# Patient Record
Sex: Female | Born: 1980
Health system: Southern US, Community
[De-identification: ages and names within clinical notes are randomized; demographics above are authoritative.]

## PROBLEM LIST (undated history)

## (undated) DIAGNOSIS — E785 Hyperlipidemia, unspecified: Secondary | ICD-10-CM

## (undated) DIAGNOSIS — G35 Multiple sclerosis: Secondary | ICD-10-CM

## (undated) DIAGNOSIS — Z95820 Peripheral vascular angioplasty status with implants and grafts: Secondary | ICD-10-CM

## (undated) DIAGNOSIS — I10 Essential (primary) hypertension: Secondary | ICD-10-CM

## (undated) DIAGNOSIS — I214 Non-ST elevation (NSTEMI) myocardial infarction: Secondary | ICD-10-CM

## (undated) HISTORY — PX: CHOLECYSTECTOMY: SHX55

## (undated) HISTORY — PX: TUBAL LIGATION: SHX77

## (undated) HISTORY — DX: Peripheral vascular angioplasty status with implants and grafts: Z95.820

---

## 2019-07-02 DIAGNOSIS — I472 Ventricular tachycardia: Secondary | ICD-10-CM

## 2019-07-02 DIAGNOSIS — I4729 Other ventricular tachycardia: Secondary | ICD-10-CM

## 2019-07-02 HISTORY — DX: Ventricular tachycardia: I47.2

## 2019-07-02 HISTORY — DX: Other ventricular tachycardia: I47.29

## 2019-07-03 ENCOUNTER — Inpatient Hospital Stay (HOSPITAL_BASED_OUTPATIENT_CLINIC_OR_DEPARTMENT_OTHER)
Admission: EM | Admit: 2019-07-03 | Discharge: 2019-07-05 | DRG: 247 | Disposition: A | Discharge status: Home / Self Care | Payer: Medicaid Other | Attending: Cardiology | Admitting: Cardiology

## 2019-07-03 ENCOUNTER — Emergency Department (HOSPITAL_BASED_OUTPATIENT_CLINIC_OR_DEPARTMENT_OTHER): Payer: Medicaid Other

## 2019-07-03 ENCOUNTER — Encounter (HOSPITAL_COMMUNITY)
Admission: EM | Disposition: A | Discharge status: Home / Self Care | Payer: Self-pay | Source: Home / Self Care | Attending: Cardiology

## 2019-07-03 ENCOUNTER — Encounter (HOSPITAL_BASED_OUTPATIENT_CLINIC_OR_DEPARTMENT_OTHER): Payer: Self-pay | Admitting: Emergency Medicine

## 2019-07-03 ENCOUNTER — Other Ambulatory Visit: Payer: Self-pay

## 2019-07-03 ENCOUNTER — Inpatient Hospital Stay (HOSPITAL_COMMUNITY): Payer: Medicaid Other

## 2019-07-03 DIAGNOSIS — I10 Essential (primary) hypertension: Secondary | ICD-10-CM | POA: Diagnosis present

## 2019-07-03 DIAGNOSIS — Z882 Allergy status to sulfonamides status: Secondary | ICD-10-CM

## 2019-07-03 DIAGNOSIS — Z720 Tobacco use: Secondary | ICD-10-CM

## 2019-07-03 DIAGNOSIS — Z87442 Personal history of urinary calculi: Secondary | ICD-10-CM | POA: Diagnosis not present

## 2019-07-03 DIAGNOSIS — Z91018 Allergy to other foods: Secondary | ICD-10-CM | POA: Diagnosis not present

## 2019-07-03 DIAGNOSIS — E785 Hyperlipidemia, unspecified: Secondary | ICD-10-CM | POA: Diagnosis present

## 2019-07-03 DIAGNOSIS — Z955 Presence of coronary angioplasty implant and graft: Secondary | ICD-10-CM

## 2019-07-03 DIAGNOSIS — I472 Ventricular tachycardia, unspecified: Secondary | ICD-10-CM

## 2019-07-03 DIAGNOSIS — H547 Unspecified visual loss: Secondary | ICD-10-CM | POA: Diagnosis present

## 2019-07-03 DIAGNOSIS — G35 Multiple sclerosis: Secondary | ICD-10-CM | POA: Diagnosis present

## 2019-07-03 DIAGNOSIS — I214 Non-ST elevation (NSTEMI) myocardial infarction: Principal | ICD-10-CM | POA: Diagnosis present

## 2019-07-03 DIAGNOSIS — I200 Unstable angina: Secondary | ICD-10-CM | POA: Diagnosis not present

## 2019-07-03 DIAGNOSIS — I249 Acute ischemic heart disease, unspecified: Secondary | ICD-10-CM

## 2019-07-03 DIAGNOSIS — F1290 Cannabis use, unspecified, uncomplicated: Secondary | ICD-10-CM | POA: Diagnosis present

## 2019-07-03 DIAGNOSIS — Z20822 Contact with and (suspected) exposure to covid-19: Secondary | ICD-10-CM | POA: Diagnosis present

## 2019-07-03 DIAGNOSIS — F17210 Nicotine dependence, cigarettes, uncomplicated: Secondary | ICD-10-CM | POA: Diagnosis present

## 2019-07-03 DIAGNOSIS — Z9049 Acquired absence of other specified parts of digestive tract: Secondary | ICD-10-CM | POA: Diagnosis not present

## 2019-07-03 DIAGNOSIS — Z8249 Family history of ischemic heart disease and other diseases of the circulatory system: Secondary | ICD-10-CM

## 2019-07-03 DIAGNOSIS — I2510 Atherosclerotic heart disease of native coronary artery without angina pectoris: Secondary | ICD-10-CM

## 2019-07-03 DIAGNOSIS — R0789 Other chest pain: Secondary | ICD-10-CM | POA: Diagnosis present

## 2019-07-03 DIAGNOSIS — I25110 Atherosclerotic heart disease of native coronary artery with unstable angina pectoris: Secondary | ICD-10-CM | POA: Diagnosis present

## 2019-07-03 HISTORY — DX: Hyperlipidemia, unspecified: E78.5

## 2019-07-03 HISTORY — PX: CORONARY BALLOON ANGIOPLASTY: CATH118233

## 2019-07-03 HISTORY — PX: CORONARY STENT INTERVENTION: CATH118234

## 2019-07-03 HISTORY — DX: Essential (primary) hypertension: I10

## 2019-07-03 HISTORY — PX: CORONARY ULTRASOUND/IVUS: CATH118244

## 2019-07-03 HISTORY — PX: INTRAVASCULAR ULTRASOUND/IVUS: CATH118244

## 2019-07-03 HISTORY — PX: LEFT HEART CATH AND CORONARY ANGIOGRAPHY: CATH118249

## 2019-07-03 HISTORY — DX: Non-ST elevation (NSTEMI) myocardial infarction: I21.4

## 2019-07-03 HISTORY — DX: Multiple sclerosis: G35

## 2019-07-03 LAB — BASIC METABOLIC PANEL
Anion gap: 10 (ref 5–15)
BUN: 14 mg/dL (ref 6–20)
CO2: 22 mmol/L (ref 22–32)
Calcium: 9.4 mg/dL (ref 8.9–10.3)
Chloride: 107 mmol/L (ref 98–111)
Creatinine, Ser: 0.64 mg/dL (ref 0.44–1.00)
GFR calc Af Amer: >60 mL/min (ref >60)
GFR calc non Af Amer: >60 mL/min (ref >60)
Glucose, Bld: 149 mg/dL — ABNORMAL HIGH (ref 70–99)
Potassium: 4.2 mmol/L (ref 3.5–5.1)
Sodium: 139 mmol/L (ref 135–145)

## 2019-07-03 LAB — CBC
HCT: 44.3 % (ref 36.0–46.0)
HCT: 38.6 % (ref 36.0–46.0)
Hemoglobin: 15 g/dL (ref 12.0–15.0)
Hemoglobin: 13 g/dL (ref 12.0–15.0)
MCH: 33 pg (ref 26.0–34.0)
MCH: 32.9 pg (ref 26.0–34.0)
MCHC: 33.9 g/dL (ref 30.0–36.0)
MCHC: 33.7 g/dL (ref 30.0–36.0)
MCV: 97.4 fL (ref 80.0–100.0)
MCV: 97.7 fL (ref 80.0–100.0)
Platelets: 235 10*3/uL (ref 150–400)
Platelets: 210 10*3/uL (ref 150–400)
RBC: 4.55 MIL/uL (ref 3.87–5.11)
RBC: 3.95 MIL/uL (ref 3.87–5.11)
RDW: 13.5 % (ref 11.5–15.5)
RDW: 13.5 % (ref 11.5–15.5)
WBC: 17.1 10*3/uL — ABNORMAL HIGH (ref 4.0–10.5)
WBC: 15.1 10*3/uL — ABNORMAL HIGH (ref 4.0–10.5)
nRBC: 0 % (ref 0.0–0.2)
nRBC: 0 % (ref 0.0–0.2)

## 2019-07-03 LAB — RAPID URINE DRUG SCREEN, HOSP PERFORMED
Amphetamines: NOT DETECTED
Barbiturates: NOT DETECTED
Benzodiazepines: NOT DETECTED
Cocaine: NOT DETECTED
Opiates: NOT DETECTED
Tetrahydrocannabinol: POSITIVE — AB

## 2019-07-03 LAB — MAGNESIUM
Magnesium: 1.8 mg/dL (ref 1.7–2.4)
Magnesium: 1.9 mg/dL (ref 1.7–2.4)

## 2019-07-03 LAB — MRSA PCR SCREENING: MRSA by PCR: NEGATIVE

## 2019-07-03 LAB — TROPONIN I (HIGH SENSITIVITY)
Troponin I (High Sensitivity): 39 ng/L — ABNORMAL HIGH (ref <18)
Troponin I (High Sensitivity): 6820 ng/L (ref <18)
Troponin I (High Sensitivity): 6633 ng/L (ref <18)

## 2019-07-03 LAB — ECHOCARDIOGRAM COMPLETE
Height: 62.5 in
Weight: 2624 oz

## 2019-07-03 LAB — POCT ACTIVATED CLOTTING TIME
Activated Clotting Time: 389 seconds
Activated Clotting Time: 423 seconds

## 2019-07-03 LAB — SARS CORONAVIRUS 2 BY RT PCR (HOSPITAL ORDER, PERFORMED IN ~~LOC~~ HOSPITAL LAB): SARS Coronavirus 2: NEGATIVE

## 2019-07-03 LAB — HIV ANTIBODY (ROUTINE TESTING W REFLEX): HIV Screen 4th Generation wRfx: NONREACTIVE

## 2019-07-03 LAB — CREATININE, SERUM
Creatinine, Ser: 0.63 mg/dL (ref 0.44–1.00)
GFR calc Af Amer: >60 mL/min (ref >60)
GFR calc non Af Amer: >60 mL/min (ref >60)

## 2019-07-03 LAB — TSH: TSH: 1.607 u[IU]/mL (ref 0.350–4.500)

## 2019-07-03 IMAGING — CR DG CHEST 2V
2 series · 2 of 2 positions shown · non-contrast
Comparison: None.

CLINICAL DATA: Chest pain and shortness of breath

EXAM:
CHEST - 2 VIEW

[w chest pa]
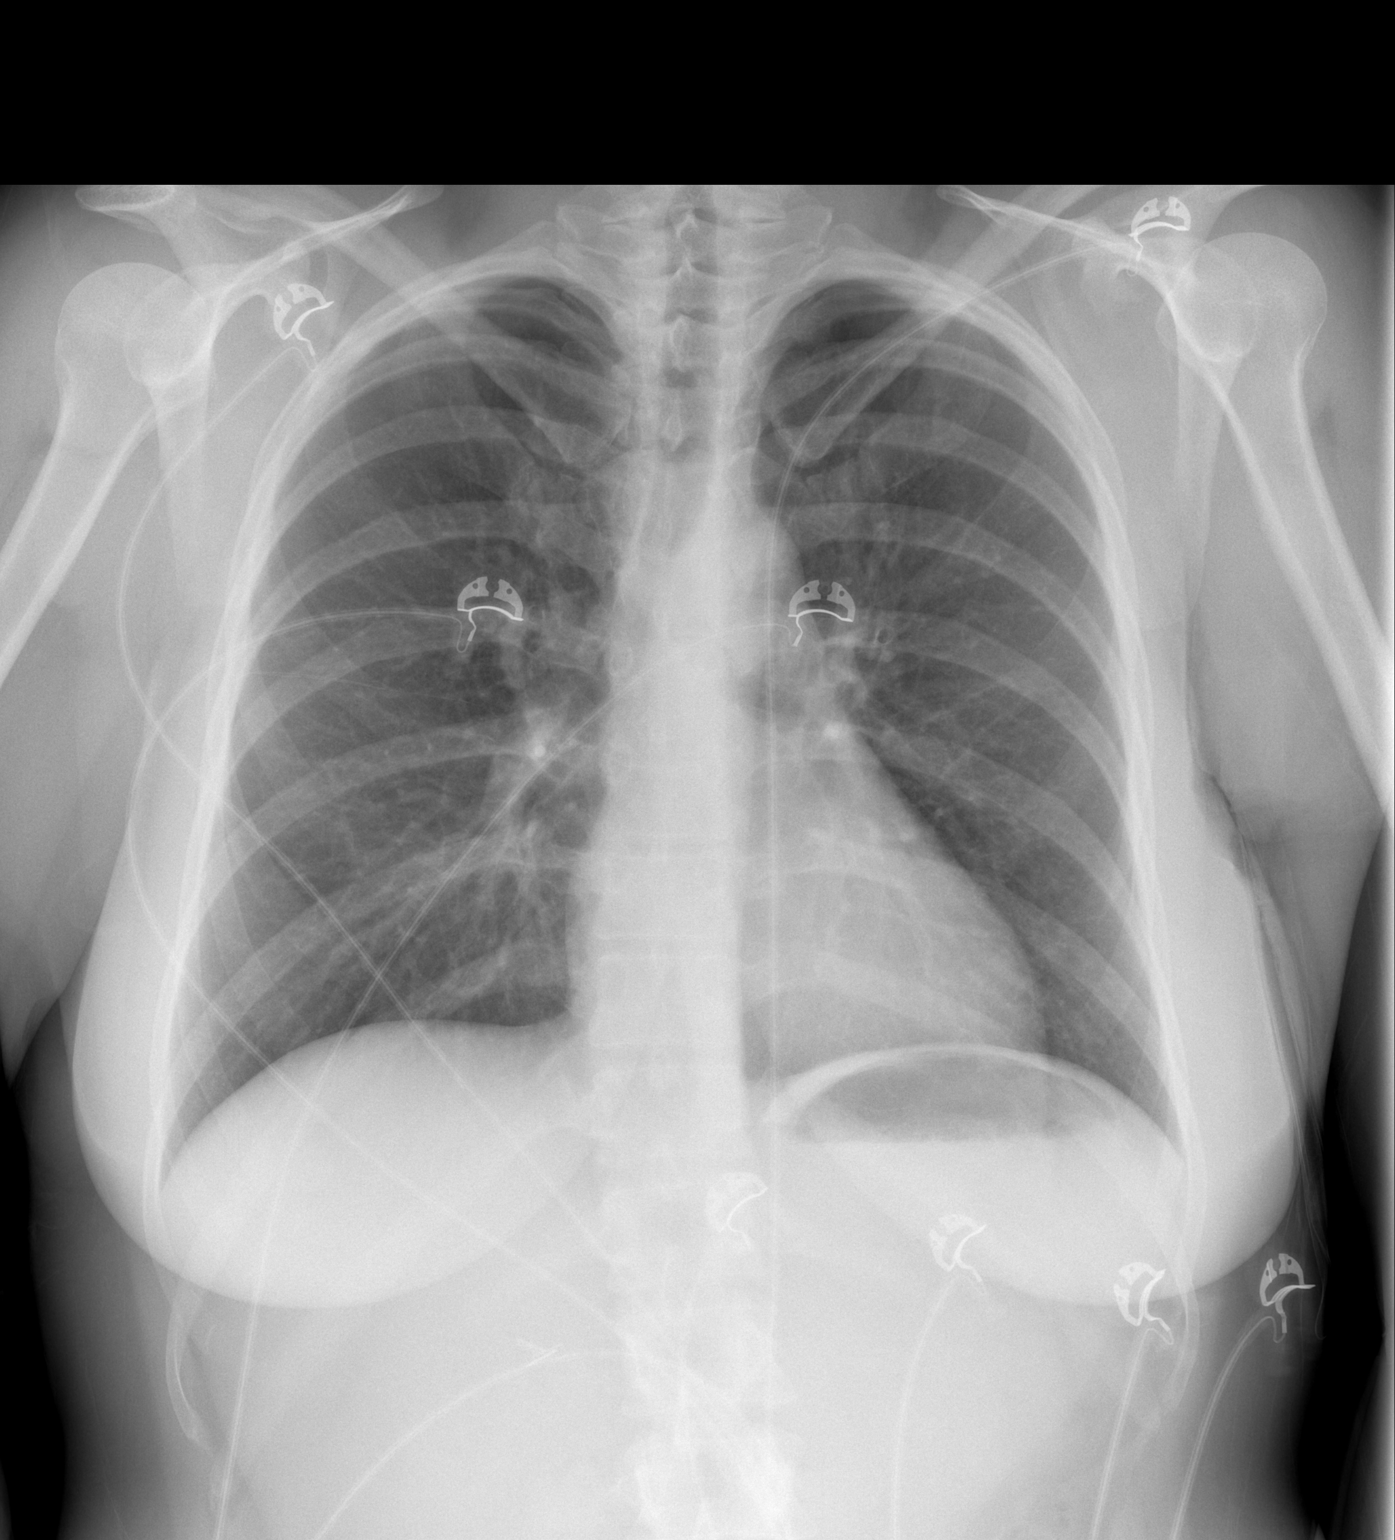

[w chest lat]
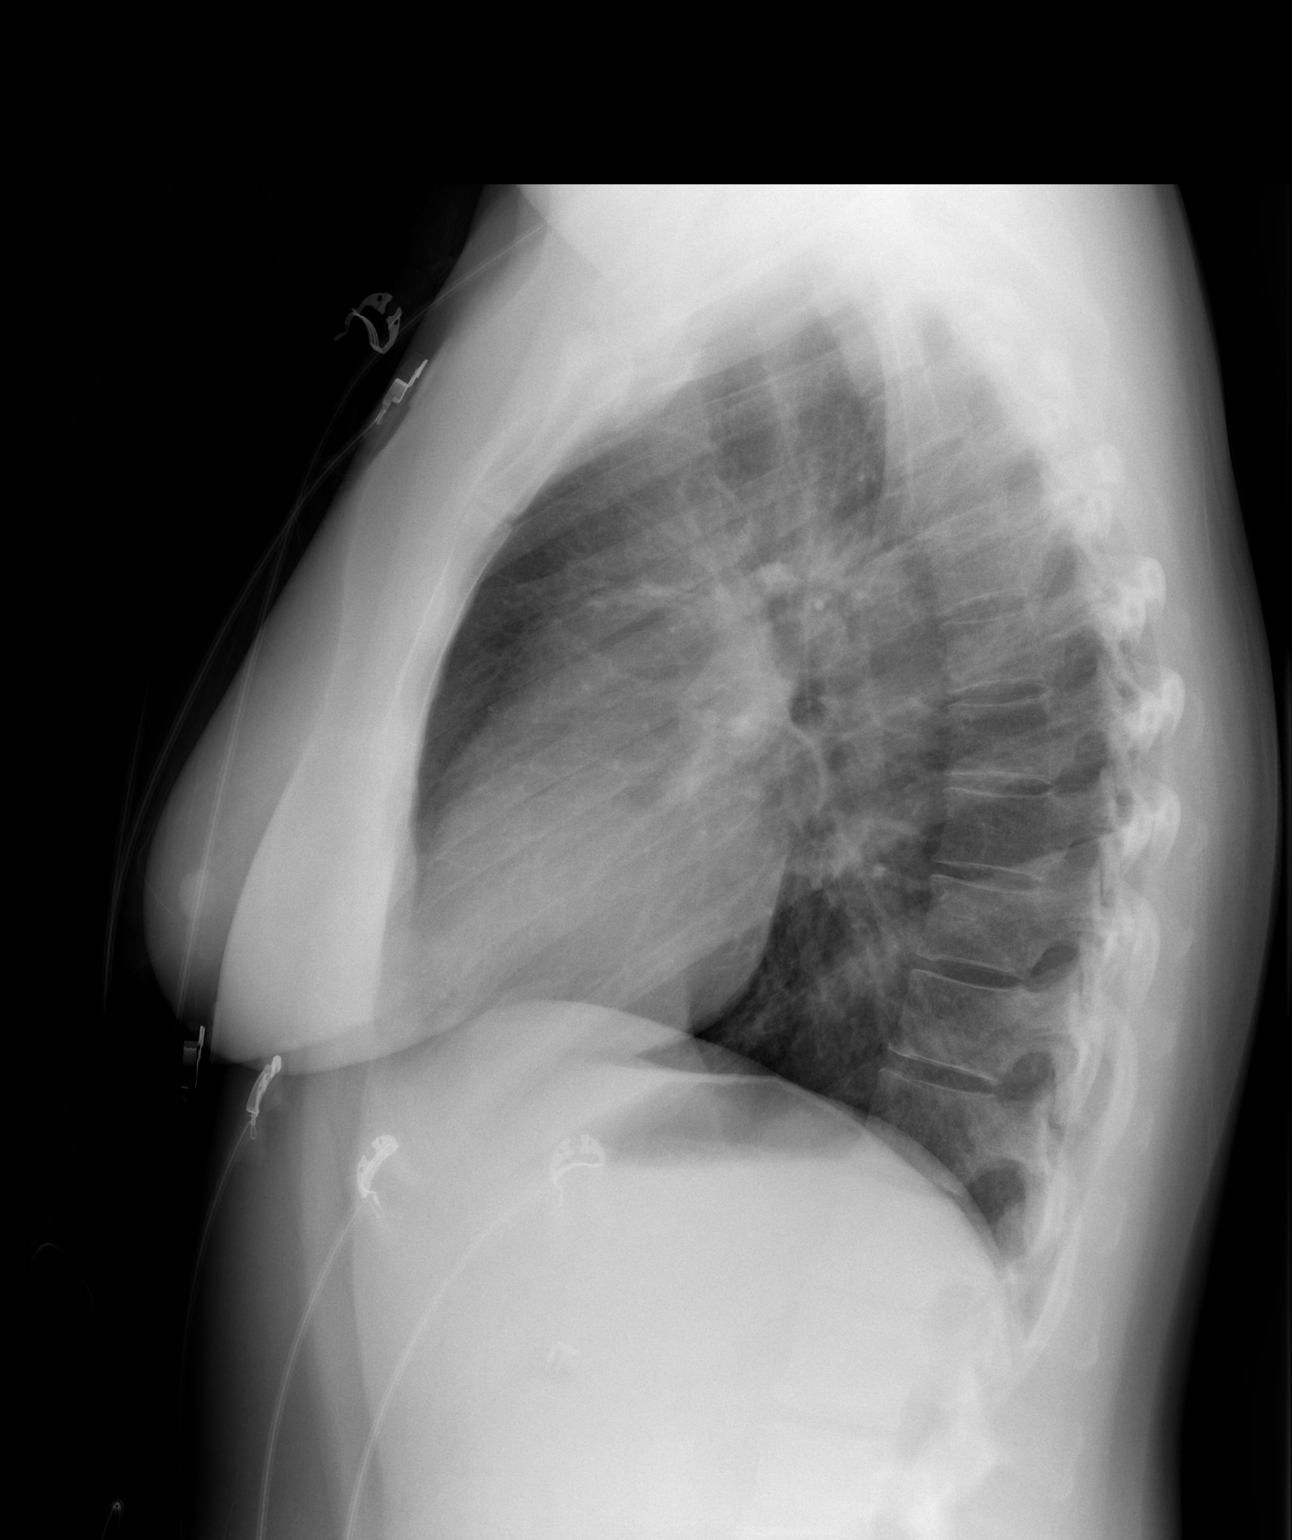

[2 of 2 positions shown; findings below may reference images not displayed]

FINDINGS: Lungs are clear. The heart size and pulmonary vascularity are
normal. No adenopathy. No pneumothorax. No bone lesions.
IMPRESSION: No abnormality noted.

## 2019-07-03 SURGERY — LEFT HEART CATH AND CORONARY ANGIOGRAPHY
Anesthesia: LOCAL

## 2019-07-03 MED ORDER — TICAGRELOR 90 MG PO TABS
ORAL_TABLET | ORAL | Status: AC
  Filled 2019-07-03: qty 1

## 2019-07-03 MED ORDER — BIVALIRUDIN TRIFLUOROACETATE 250 MG IV SOLR
INTRAVENOUS | Status: AC
  Filled 2019-07-03: qty 250

## 2019-07-03 MED ORDER — ASPIRIN 81 MG PO CHEW
324.0000 mg | CHEWABLE_TABLET | Freq: Once | ORAL | Status: AC
  Administered 2019-07-03: 324 mg via ORAL

## 2019-07-03 MED ORDER — KETOROLAC TROMETHAMINE 30 MG/ML IJ SOLN
30.0000 mg | Freq: Once | INTRAMUSCULAR | Status: AC
  Administered 2019-07-03: 30 mg via INTRAVENOUS
  Filled 2019-07-03: qty 1

## 2019-07-03 MED ORDER — ONDANSETRON HCL 4 MG/2ML IJ SOLN
INTRAMUSCULAR | Status: AC
  Filled 2019-07-03: qty 2

## 2019-07-03 MED ORDER — SODIUM CHLORIDE 0.9 % WEIGHT BASED INFUSION: 1.0000 mL/kg/h | INTRAVENOUS | Status: DC

## 2019-07-03 MED ORDER — BIVALIRUDIN BOLUS VIA INFUSION - CUPID
INTRAVENOUS | Status: DC | PRN
  Administered 2019-07-03: 55.5 mg via INTRAVENOUS

## 2019-07-03 MED ORDER — SODIUM CHLORIDE 0.9 % WEIGHT BASED INFUSION: 3.0000 mL/kg/h | INTRAVENOUS | Status: DC

## 2019-07-03 MED ORDER — FENTANYL CITRATE (PF) 100 MCG/2ML IJ SOLN
INTRAMUSCULAR | Status: AC
  Filled 2019-07-03: qty 2

## 2019-07-03 MED ORDER — AMIODARONE IV BOLUS ONLY 150 MG/100ML
150.0000 mg | Freq: Once | INTRAVENOUS | Status: AC
  Administered 2019-07-03: 150 mg via INTRAVENOUS
  Filled 2019-07-03: qty 100

## 2019-07-03 MED ORDER — SODIUM CHLORIDE 0.9 % IV SOLN: 250.0000 mL | INTRAVENOUS | Status: DC | PRN

## 2019-07-03 MED ORDER — FENTANYL CITRATE (PF) 100 MCG/2ML IJ SOLN
INTRAMUSCULAR | Status: DC | PRN
  Administered 2019-07-03 (×4): 25 ug via INTRAVENOUS

## 2019-07-03 MED ORDER — SODIUM CHLORIDE 0.9 % IV BOLUS
1000.0000 mL | Freq: Once | INTRAVENOUS | Status: AC
  Administered 2019-07-03: 1000 mL via INTRAVENOUS

## 2019-07-03 MED ORDER — NITROGLYCERIN 1 MG/10 ML FOR IR/CATH LAB
INTRA_ARTERIAL | Status: AC
  Filled 2019-07-03: qty 10

## 2019-07-03 MED ORDER — VERAPAMIL HCL 2.5 MG/ML IV SOLN
INTRAVENOUS | Status: DC | PRN
  Administered 2019-07-03: 10 mL via INTRA_ARTERIAL

## 2019-07-03 MED ORDER — TIROFIBAN HCL IN NACL 5-0.9 MG/100ML-% IV SOLN
0.1500 ug/kg/min | INTRAVENOUS | Status: AC
  Administered 2019-07-03 – 2019-07-04 (×2): 0.15 ug/kg/min via INTRAVENOUS
  Filled 2019-07-03 (×3): qty 100

## 2019-07-03 MED ORDER — ASPIRIN 81 MG PO CHEW
CHEWABLE_TABLET | ORAL | Status: AC
  Filled 2019-07-03: qty 4

## 2019-07-03 MED ORDER — MIDAZOLAM HCL 2 MG/2ML IJ SOLN
INTRAMUSCULAR | Status: AC
  Filled 2019-07-03: qty 2

## 2019-07-03 MED ORDER — SODIUM CHLORIDE 0.9 % IV SOLN
INTRAVENOUS | Status: AC | PRN
  Administered 2019-07-03: 1.75 mg/kg/h
  Administered 2019-07-03 (×2): 1.75 mg/kg/h via INTRAVENOUS

## 2019-07-03 MED ORDER — MAGNESIUM SULFATE 50 % IJ SOLN
1.0000 g | Freq: Once | INTRAMUSCULAR | Status: AC
  Administered 2019-07-03: 1 g via INTRAVENOUS
  Filled 2019-07-03: qty 2

## 2019-07-03 MED ORDER — SODIUM CHLORIDE 0.9% FLUSH: 3.0000 mL | INTRAVENOUS | Status: DC | PRN

## 2019-07-03 MED ORDER — NITROGLYCERIN IN D5W 200-5 MCG/ML-% IV SOLN: 0.0000 ug/min | INTRAVENOUS | Status: DC

## 2019-07-03 MED ORDER — ONDANSETRON HCL 4 MG/2ML IJ SOLN
4.0000 mg | Freq: Four times a day (QID) | INTRAMUSCULAR | Status: DC | PRN
  Administered 2019-07-03 – 2019-07-04 (×2): 4 mg via INTRAVENOUS
  Filled 2019-07-03 (×2): qty 2

## 2019-07-03 MED ORDER — LIDOCAINE HCL (PF) 1 % IJ SOLN
INTRAMUSCULAR | Status: DC | PRN
  Administered 2019-07-03: 2 mL

## 2019-07-03 MED ORDER — TICAGRELOR 90 MG PO TABS
ORAL_TABLET | ORAL | Status: DC | PRN
  Administered 2019-07-03 (×2): 180 mg via ORAL

## 2019-07-03 MED ORDER — HYDRALAZINE HCL 20 MG/ML IJ SOLN: 10.0000 mg | INTRAMUSCULAR | Status: AC | PRN

## 2019-07-03 MED ORDER — SODIUM CHLORIDE 0.9 % IV SOLN
1.7500 mg/kg/h | INTRAVENOUS | Status: AC
  Filled 2019-07-03 (×2): qty 250

## 2019-07-03 MED ORDER — TIROFIBAN HCL IN NACL 5-0.9 MG/100ML-% IV SOLN
INTRAVENOUS | Status: AC | PRN
  Administered 2019-07-03: 0.15 ug/kg/min via INTRAVENOUS

## 2019-07-03 MED ORDER — MIDAZOLAM HCL 2 MG/2ML IJ SOLN
INTRAMUSCULAR | Status: DC | PRN
  Administered 2019-07-03 (×4): 1 mg via INTRAVENOUS

## 2019-07-03 MED ORDER — NITROGLYCERIN 1 MG/10 ML FOR IR/CATH LAB
INTRA_ARTERIAL | Status: DC | PRN
  Administered 2019-07-03 (×3): 200 ug via INTRACORONARY

## 2019-07-03 MED ORDER — TICAGRELOR 90 MG PO TABS
90.0000 mg | ORAL_TABLET | Freq: Two times a day (BID) | ORAL | Status: DC
  Administered 2019-07-04 – 2019-07-05 (×3): 90 mg via ORAL
  Filled 2019-07-03 (×3): qty 1

## 2019-07-03 MED ORDER — VERAPAMIL HCL 2.5 MG/ML IV SOLN
INTRAVENOUS | Status: AC
  Filled 2019-07-03: qty 2

## 2019-07-03 MED ORDER — SIMVASTATIN 20 MG PO TABS
40.0000 mg | ORAL_TABLET | Freq: Every day | ORAL | Status: DC
  Administered 2019-07-03: 40 mg via ORAL
  Filled 2019-07-03: qty 2

## 2019-07-03 MED ORDER — LIDOCAINE HCL (PF) 1 % IJ SOLN
INTRAMUSCULAR | Status: AC
  Filled 2019-07-03: qty 30

## 2019-07-03 MED ORDER — TIROFIBAN (AGGRASTAT) BOLUS VIA INFUSION
INTRAVENOUS | Status: DC | PRN
  Administered 2019-07-03: 1850 ug via INTRAVENOUS

## 2019-07-03 MED ORDER — MORPHINE SULFATE (PF) 2 MG/ML IV SOLN
2.0000 mg | INTRAVENOUS | Status: AC | PRN
  Administered 2019-07-03 – 2019-07-04 (×2): 2 mg via INTRAVENOUS
  Filled 2019-07-03 (×2): qty 1

## 2019-07-03 MED ORDER — TICAGRELOR 90 MG PO TABS
ORAL_TABLET | ORAL | Status: AC
  Filled 2019-07-03: qty 2

## 2019-07-03 MED ORDER — METOPROLOL TARTRATE 12.5 MG HALF TABLET
12.5000 mg | ORAL_TABLET | Freq: Two times a day (BID) | ORAL | Status: DC
  Administered 2019-07-03 – 2019-07-05 (×5): 12.5 mg via ORAL
  Filled 2019-07-03 (×5): qty 1

## 2019-07-03 MED ORDER — HEPARIN SODIUM (PORCINE) 5000 UNIT/ML IJ SOLN: 5000.0000 [IU] | Freq: Three times a day (TID) | INTRAMUSCULAR | Status: DC

## 2019-07-03 MED ORDER — AMIODARONE HCL IN DEXTROSE 360-4.14 MG/200ML-% IV SOLN
30.0000 mg/h | INTRAVENOUS | Status: DC
  Administered 2019-07-03: 30 mg/h via INTRAVENOUS
  Filled 2019-07-03: qty 200

## 2019-07-03 MED ORDER — SODIUM CHLORIDE 0.9% FLUSH
3.0000 mL | Freq: Two times a day (BID) | INTRAVENOUS | Status: DC
  Administered 2019-07-03: 10 mL via INTRAVENOUS

## 2019-07-03 MED ORDER — AMIODARONE HCL IN DEXTROSE 360-4.14 MG/200ML-% IV SOLN
60.0000 mg/h | INTRAVENOUS | Status: AC
  Administered 2019-07-03 (×2): 60 mg/h via INTRAVENOUS
  Filled 2019-07-03: qty 200

## 2019-07-03 MED ORDER — NITROGLYCERIN 0.4 MG SL SUBL: 0.4000 mg | SUBLINGUAL_TABLET | SUBLINGUAL | Status: DC | PRN

## 2019-07-03 MED ORDER — AMIODARONE HCL IN DEXTROSE 360-4.14 MG/200ML-% IV SOLN
INTRAVENOUS | Status: AC
  Filled 2019-07-03: qty 200

## 2019-07-03 MED ORDER — TIROFIBAN HCL IN NACL 5-0.9 MG/100ML-% IV SOLN
INTRAVENOUS | Status: AC
  Filled 2019-07-03: qty 100

## 2019-07-03 MED ORDER — NITROGLYCERIN IN D5W 200-5 MCG/ML-% IV SOLN
INTRAVENOUS | Status: AC | PRN
  Administered 2019-07-03: 10 ug/min via INTRAVENOUS

## 2019-07-03 MED ORDER — LABETALOL HCL 5 MG/ML IV SOLN: 10.0000 mg | INTRAVENOUS | Status: AC | PRN

## 2019-07-03 MED ORDER — CHLORHEXIDINE GLUCONATE CLOTH 2 % EX PADS
6.0000 | MEDICATED_PAD | Freq: Every day | CUTANEOUS | Status: DC
  Administered 2019-07-03 – 2019-07-04 (×2): 6 via TOPICAL

## 2019-07-03 MED ORDER — ONDANSETRON HCL 4 MG/2ML IJ SOLN
INTRAMUSCULAR | Status: DC | PRN
  Administered 2019-07-03: 4 mg via INTRAVENOUS

## 2019-07-03 MED ORDER — MORPHINE SULFATE (PF) 4 MG/ML IV SOLN
INTRAVENOUS | Status: DC | PRN
  Administered 2019-07-03: 2 mg via INTRAVENOUS

## 2019-07-03 MED ORDER — IOHEXOL 350 MG/ML SOLN
INTRAVENOUS | Status: DC | PRN
  Administered 2019-07-03: 290 mL via INTRA_ARTERIAL

## 2019-07-03 MED ORDER — SODIUM CHLORIDE 0.9% FLUSH
3.0000 mL | Freq: Two times a day (BID) | INTRAVENOUS | Status: DC
  Administered 2019-07-03 – 2019-07-05 (×4): 3 mL via INTRAVENOUS

## 2019-07-03 MED ORDER — MORPHINE SULFATE (PF) 4 MG/ML IV SOLN
INTRAVENOUS | Status: AC
  Filled 2019-07-03: qty 1

## 2019-07-03 MED ORDER — ATORVASTATIN CALCIUM 80 MG PO TABS
80.0000 mg | ORAL_TABLET | Freq: Every day | ORAL | Status: DC
  Administered 2019-07-03 – 2019-07-05 (×3): 80 mg via ORAL
  Filled 2019-07-03 (×3): qty 1

## 2019-07-03 MED ORDER — SODIUM CHLORIDE 0.9 % IV SOLN: INTRAVENOUS | Status: AC

## 2019-07-03 MED ORDER — ACETAMINOPHEN 325 MG PO TABS
650.0000 mg | ORAL_TABLET | ORAL | Status: DC | PRN
  Administered 2019-07-04 (×2): 650 mg via ORAL
  Filled 2019-07-03 (×2): qty 2

## 2019-07-03 MED ORDER — ASPIRIN EC 81 MG PO TBEC
81.0000 mg | DELAYED_RELEASE_TABLET | Freq: Every day | ORAL | Status: DC
  Administered 2019-07-04 – 2019-07-05 (×2): 81 mg via ORAL
  Filled 2019-07-03 (×2): qty 1

## 2019-07-03 SURGICAL SUPPLY — 32 items
BALLN SAPPHIRE 2.0X12 (BALLOONS) ×2
BALLN SAPPHIRE 2.5X12 (BALLOONS) ×2
BALLN SAPPHIRE ~~LOC~~ 3.0X15 (BALLOONS) ×4 IMPLANT
BALLN SAPPHIRE ~~LOC~~ 3.25X10 (BALLOONS) ×2 IMPLANT
BALLN SAPPHIRE ~~LOC~~ 3.5X12 (BALLOONS) ×2 IMPLANT
BALLN SAPPHIRE ~~LOC~~ 3.5X8 (BALLOONS) ×2 IMPLANT
BALLN ~~LOC~~ EMERGE MR 4.0X6 (BALLOONS) ×2
BALLOON SAPPHIRE 2.0X12 (BALLOONS) ×1 IMPLANT
BALLOON SAPPHIRE 2.5X12 (BALLOONS) ×1 IMPLANT
BALLOON ~~LOC~~ EMERGE MR 4.0X6 (BALLOONS) ×1 IMPLANT
CATH 5FR JL3.5 JR4 ANG PIG MP (CATHETERS) ×2 IMPLANT
CATH OPTICROSS HD (CATHETERS) ×2 IMPLANT
CATH VISTA GUIDE 6FR XBLAD3.5 (CATHETERS) ×2 IMPLANT
DEVICE RAD COMP TR BAND LRG (VASCULAR PRODUCTS) ×4 IMPLANT
ELECT DEFIB PAD ADLT CADENCE (PAD) ×2 IMPLANT
GLIDESHEATH SLEND SS 6F .021 (SHEATH) ×2 IMPLANT
GUIDEWIRE INQWIRE 1.5J.035X260 (WIRE) ×1 IMPLANT
INQWIRE 1.5J .035X260CM (WIRE) ×2
KIT ENCORE 26 ADVANTAGE (KITS) ×4 IMPLANT
KIT HEART LEFT (KITS) ×2 IMPLANT
PACK CARDIAC CATHETERIZATION (CUSTOM PROCEDURE TRAY) ×2 IMPLANT
SHEATH GLIDE SLENDER 4/5FR (SHEATH) ×2 IMPLANT
SHEATH PROBE COVER 6X72 (BAG) ×2 IMPLANT
SLED PULL BACK IVUS (MISCELLANEOUS) ×2 IMPLANT
STENT RESOLUTE ONYX 3.0X12 (Permanent Stent) ×2 IMPLANT
STENT RESOLUTE ONYX 3.0X22 (Permanent Stent) ×2 IMPLANT
TRANSDUCER W/STOPCOCK (MISCELLANEOUS) ×2 IMPLANT
TUBING CIL FLEX 10 FLL-RA (TUBING) ×2 IMPLANT
WIRE COUGAR XT STRL 190CM (WIRE) ×2 IMPLANT
WIRE HI TORQ VERSACORE-J 145CM (WIRE) ×2 IMPLANT
WIRE HI TORQ WHISPER MS 190CM (WIRE) ×2 IMPLANT
WIRE RUNTHROUGH .014X180CM (WIRE) ×2 IMPLANT

## 2019-07-03 NOTE — ED Notes (Signed)
Cards at bedside

## 2019-07-03 NOTE — ED Provider Notes (Addendum)
[facility] EMERGENCY DEPARTMENT Provider Note   CSN: [id number] Arrival date & time: 07/03/19  5462     History Chief Complaint  Patient presents with  . Chest Pain    Grace Castillo is a 39 y.o. female.  Patient is a 39 year old female with history of prior cholecystectomy, hypertension, MS.  She presents today for evaluation of chest pain.  She describes a pressure to the center of her chest that radiates through to the back.  This began this morning.  She feels short of breath, but denies nausea.  She denies recent exertional symptoms.  She does report helping her sister pack for an upcoming move.  Patient was moving furniture and carrying heavy objects.  She has no prior cardiac history, but does have the risk factors of hypertension, tobacco use, and family history.  The history is provided by the patient.  Chest Pain Pain location:  Substernal area Pain quality: pressure   Pain radiates to:  Does not radiate Pain severity:  Moderate Onset quality:  Sudden Timing:  Constant Progression:  Unchanged Chronicity:  New Relieved by:  Nothing Worsened by:  Nothing      Past Medical History:  Diagnosis Date  . Hypertension   . MS (multiple sclerosis) ([place])     There are no problems to display for this patient.   Past Surgical History:  Procedure Laterality Date  . CHOLECYSTECTOMY    . TUBAL LIGATION       OB History   No obstetric history on file.     No family history on file.  Social History   Tobacco Use  . Smoking status: Current Every Day Smoker    Packs/day: 1.00    Types: Cigarettes  . Smokeless tobacco: Never Used  Substance Use Topics  . Alcohol use: Never  . Drug use: Yes    Types: Marijuana    Home Medications Prior to Admission medications   Not on File    Allergies    Pork-derived products and Sulfa antibiotics  Review of Systems   Review of Systems  Cardiovascular: Positive for chest pain.  All other systems  reviewed and are negative.   Physical Exam Updated Vital Signs BP (!) 141/94   Pulse 65   Temp (!) 97.5 F (36.4 C) (Oral)   Resp 18   Ht 5' 2.5" (1.588 m)   Wt 74.4 kg   LMP 06/13/2019   SpO2 100%   BMI 29.52 kg/m   Physical Exam Vitals and nursing note reviewed.  Constitutional:      General: She is not in acute distress.    Appearance: She is well-developed. She is not diaphoretic.  HENT:     Head: Normocephalic and atraumatic.  Cardiovascular:     Rate and Rhythm: Normal rate and regular rhythm.     Heart sounds: No murmur. No friction rub. No gallop.   Pulmonary:     Effort: Pulmonary effort is normal. No respiratory distress.     Breath sounds: Normal breath sounds. No wheezing.  Abdominal:     General: Bowel sounds are normal. There is no distension.     Palpations: Abdomen is soft.     Tenderness: There is no abdominal tenderness.  Musculoskeletal:        General: Normal range of motion.     Cervical back: Normal range of motion and neck supple.     Right lower leg: No tenderness. No edema.     Left lower leg: No  tenderness. No edema.  Skin:    General: Skin is warm and dry.  Neurological:     Mental Status: She is alert and oriented to person, place, and time.     ED Results / Procedures / Treatments   Labs (all labs ordered are listed, but only abnormal results are displayed) Labs Reviewed  CBC - Abnormal; Notable for the following components:      Result Value   WBC 17.1 (*)    All other components within normal limits  BASIC METABOLIC PANEL  TROPONIN I (HIGH SENSITIVITY)    EKG EKG Interpretation  Date/Time:  Monday Jul 03 2019 08:29:15 EDT Ventricular Rate:  66 PR Interval:    QRS Duration: 82 QT Interval:  427 QTC Calculation: 448 R Axis:   72 Text Interpretation: Sinus rhythm Probable left atrial enlargement Probable anteroseptal infarct, old Repol abnrm Confirmed by Geoffery Lyons ([id number]) on 07/03/2019 8:35:46 AM   Radiology DG  Chest 2 View  Result Date: 07/03/2019 CLINICAL DATA:  Chest pain and shortness of breath EXAM: CHEST - 2 VIEW COMPARISON:  None. FINDINGS: Lungs are clear. The heart size and pulmonary vascularity are normal. No adenopathy. No pneumothorax. No bone lesions. IMPRESSION: No abnormality noted. Electronically Signed   By: Bretta Bang III M.D.   On: 07/03/2019 09:09    Procedures Procedures (including critical care time)  Medications Ordered in ED Medications  sodium chloride 0.9 % bolus 1,000 mL (has no administration in time range)  ketorolac (TORADOL) 30 MG/ML injection 30 mg (has no administration in time range)    ED Course  I have reviewed the triage vital signs and the nursing notes.  Pertinent labs & imaging results that were available during my care of the patient were reviewed by me and considered in my medical decision making (see chart for details).    MDM Rules/Calculators/A&P  Patient is a 39 year old female with no prior cardiac history presenting with complaints of chest pain.  This began this morning shortly after waking up.  She reports helping her sister move over the past few days and has been exerting herself and lifting heavy objects more than normal.  Her initial EKG shows ST depressions throughout concerning for ischemia.  Cardiac work-up was initiated including troponin and laboratory studies.  While waiting for these studies to return, the patient experienced an episode of palpitations, and was found to have a short run of ventricular tachycardia.        This resolved spontaneously.  Shortly after this, her troponin returned at 25.  I discussed the case with Dr. Anne Fu from cardiology.  We are in agreement that she should be started on amiodarone and transported to the emergency department at [facility] for cardiology evaluation.  Patient will likely require a cardiac cath as she appears to be experiencing an acute coronary syndrome.  I have spoken with  Dr. Lockie Mola at [facility] who agrees to accept in transfer.  CRITICAL CARE Performed by: Geoffery Lyons Total critical care time: 45 minutes Critical care time was exclusive of separately billable procedures and treating other patients. Critical care was necessary to treat or prevent imminent or life-threatening deterioration. Critical care was time spent personally by me on the following activities: development of treatment plan with patient and/or surrogate as well as nursing, discussions with consultants, evaluation of patient's response to treatment, examination of patient, obtaining history from patient or surrogate, ordering and performing treatments and interventions, ordering and review of laboratory studies, ordering and  review of radiographic studies, pulse oximetry and re-evaluation of patient's condition.   Final Clinical Impression(s) / ED Diagnoses Final diagnoses:  None    Rx / DC Orders ED Discharge Orders    None       Geoffery Lyons, MD 07/03/19 1024    Geoffery Lyons, MD 07/03/19 1210

## 2019-07-03 NOTE — H&P (Addendum)
Cardiology Admission History and Physical:   Patient ID: Grace Castillo MRN: [id number]; DOB: November 09, 1980   Admission date: 07/03/2019  Primary Care Provider: Patient, No Pcp Per Primary Cardiologist: Donato Schultz, MD new Primary Electrophysiologist:  None   Chief Complaint:  VT, unstable angina  Patient Profile:   Grace Castillo is a 39 y.o. female with a history of untreated MS, untreated hypertension, and current tobacco use.   History of Present Illness:   Ms. [name] presented to [facility] with pressure and sharp chest pain in her substernal chest and shortness of breath. When she got out of the shower, chest heaviness worsened, associated with perfuse diaphoresis. Step-father made her go to the ER. CP on arrival to [facility] with shooting pain to her left arm. She was also in VT on telemetry but converted with IV amiodarone. CP started about 1 month ago and would persist for 2 hrs.  Over the last month, she reports CP on exertion when walking up stairs, walking on flat ground, or carrying boxes. Today's chest pain was much more severe with SOB and nausea.  She denies COVID infection and has not had the COVID vaccination. She smokes cigarettes, smokes marijuana 2 times per week. Denies alcohol use. She does not have a relationship with her biological parents. Maternal grandfather died of MI at age 52. No sudden collapse or syncope in her lifetime. No medications for MS. 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 She works as a Quarry manager. [place] moved [place] ago [place] and hasn't established with neurology and has not restarted any of her home medications.  She has had 22 kidney stones requiring stents and lithotripsy.  EKG does show ST depression V4/5/6.    Past Medical History:  Diagnosis Date  . Hypertension   . MS (multiple sclerosis) ([facility])     Past Surgical History:  Procedure Laterality Date  . CHOLECYSTECTOMY    . TUBAL LIGATION       Medications Prior to  Admission: Prior to Admission medications   Not on File     Allergies:    Allergies  Allergen Reactions  . Pork-Derived Products   . Sulfa Antibiotics Nausea And Vomiting    Social History:   Social History   Socioeconomic History  . Marital status: Single    Spouse name: Not on file  . Number of children: Not on file  . Years of education: Not on file  . Highest education level: Not on file  Occupational History  . Not on file  Tobacco Use  . Smoking status: Current Every Day Smoker    Packs/day: 1.00    Types: Cigarettes  . Smokeless tobacco: Never Used  Substance and Sexual Activity  . Alcohol use: Never  . Drug use: Yes    Types: Marijuana  . Sexual activity: Not on file  Other Topics Concern  . Not on file  Social History Narrative  . Not on file   Social Determinants of Health   Financial Resource Strain:   . Difficulty of Paying Living Expenses:   Food Insecurity:   . Worried About Programme researcher, broadcasting/film/video in the Last Year:   . Barista in the Last Year:   Transportation Needs:   . Freight forwarder (Medical):   Marland Kitchen Lack of Transportation (Non-Medical):   Physical Activity:   . Days of Exercise per Week:   . Minutes of Exercise per Session:   Stress:   .  Feeling of Stress :   Social Connections:   . Frequency of Communication with Friends and Family:   . Frequency of Social Gatherings with Friends and Family:   . Attends Religious Services:   . Active Member of Clubs or Organizations:   . Attends Banker Meetings:   Marland Kitchen Marital Status:   Intimate Partner Violence:   . Fear of Current or Ex-Partner:   . Emotionally Abused:   Marland Kitchen Physically Abused:   . Sexually Abused:     Family History:   The patient's family history includes Sudden Cardiac Death in her maternal grandfather.    ROS:  Please see the history of present illness.  All other ROS reviewed and negative.     Physical Exam/Data:   Vitals:   07/03/19 0949 07/03/19  1130 07/03/19 1145 07/03/19 1154  BP: 135/84   140/86  Pulse: 66 70 (!) 59 66  Resp: 18 18 17  (!) 22  Temp:      TempSrc:      SpO2: 100% 100% 99% 99%  Weight:      Height:       No intake or output data in the 24 hours ending 07/03/19 1210 Last 3 Weights 07/03/2019  Weight (lbs) 164 lb  Weight (kg) 74.39 kg     Body mass index is 29.52 kg/m.  General:  Well nourished, well developed, in no acute distress HEENT: normal Lymph: no adenopathy Neck: no JVD Endocrine:  No thryomegaly Vascular: No carotid bruits; FA pulses 2+ bilaterally without bruits  Cardiac:  normal S1, S2; RRR; no murmur  Lungs:  clear to auscultation bilaterally, no wheezing, rhonchi or rales  Abd: soft, nontender, no hepatomegaly  Ext: no edema Musculoskeletal:  No deformities, BUE and BLE strength normal and equal Skin: warm and dry  Neuro:  CNs 2-12 intact, no focal abnormalities noted Psych:  Normal affect    EKG:  The ECG that was done was personally reviewed and demonstrates sinus rhythm HR 63, ST depression V4/5/6, inferior leads, ST elevation in AVR  Relevant CV Studies:  Echo pending   Heart cath pending  Laboratory Data:  High Sensitivity Troponin:   Recent Labs  Lab 07/03/19 0853  TROPONINIHS 39*      Chemistry Recent Labs  Lab 07/03/19 0853  NA 139  K 4.2  CL 107  CO2 22  GLUCOSE 149*  BUN 14  CREATININE 0.64  CALCIUM 9.4  GFRNONAA >60  GFRAA >60  ANIONGAP 10    No results for input(s): PROT, ALBUMIN, AST, ALT, ALKPHOS, BILITOT in the last 168 hours. Hematology Recent Labs  Lab 07/03/19 0853  WBC 17.1*  RBC 4.55  HGB 15.0  HCT 44.3  MCV 97.4  MCH 33.0  MCHC 33.9  RDW 13.5  PLT 235   BNPNo results for input(s): BNP, PROBNP in the last 168 hours.  DDimer No results for input(s): DDIMER in the last 168 hours.   Radiology/Studies:  DG Chest 2 View  Result Date: 07/03/2019 CLINICAL DATA:  Chest pain and shortness of breath EXAM: CHEST - 2 VIEW COMPARISON:   None. FINDINGS: Lungs are clear. The heart size and pulmonary vascularity are normal. No adenopathy. No pneumothorax. No bone lesions. IMPRESSION: No abnormality noted. Electronically Signed   By: 07/05/2019 III M.D.   On: 07/03/2019 09:09       HEAR Score (for undifferentiated chest pain):  HEAR Score: 5    Assessment and Plan:   1. Chest pain  concerning for unstable angina 2. VT 3. Elevated troponin - pt presented to [facility] with chest pain and VT - VT converted spontaneously to NSR placed subsequently with IV amiodarone, now running at 60 mg/hr - hs troponin 39 - given her risk factors of untreated HTN and smoking along with family history of premature heart disease, will obtain coronary angiography today - will start high dose statin and low dose BB   4. Untreated hypertension - will monitor BP following angiography    5. Risk factor modification - lipid profile pending - A1c pending - TSH pending   6. Current smoker - needs to quit smoking     Severity of Illness: The appropriate patient status for this patient is INPATIENT. Inpatient status is judged to be reasonable and necessary in order to provide the required intensity of service to ensure the patient's safety. The patient's presenting symptoms, physical exam findings, and initial radiographic and laboratory data in the context of their chronic comorbidities is felt to place them at high risk for further clinical deterioration. Furthermore, it is not anticipated that the patient will be medically stable for discharge from the hospital within 2 midnights of admission. The following factors support the patient status of inpatient.   " The patient's presenting symptoms include CP, VT. " The worrisome physical exam findings include chest pain. " The initial radiographic and laboratory data are worrisome because of ST changes on EKG. " The chronic co-morbidities include HTN, smoker.   * I certify that at the point  of admission it is my clinical judgment that the patient will require inpatient hospital care spanning beyond 2 midnights from the point of admission due to high intensity of service, high risk for further deterioration and high frequency of surveillance required.*    For questions or updates, please contact [place] Please consult [email or web address] for contact info under        Signed, Candee Furbish, MD  07/03/2019 12:10 PM   Personally seen and examined. Agree with above.  39 year old female with history of untreated hypertension, previously diagnosed with multiple sclerosis on prior meds 3 years ago not currently taking with partial right eye blindness, grandfather who had sudden cardiac death at age 49, smoker, nondiabetic transferred from [place] with chest pain, ventricular tachycardia nonsustained, severe central, radiated to left arm, associated shortness of breath and diaphoresis.  Over the past month she has been experiencing more chest discomfort with exertional activity.  Wanes with rest.  EKG performed at [facility] shows mild ST segment depression inferior laterally with mild aVR elevation.  EKG currently has improved.  Telemetry at [facility] revealed nonsustained ventricular tachycardia episode.  She remembers feeling this.  Her chest pain was quite severe during that episode and it radiated to her left arm.  She has not had any unexplained syncope previously.  High-sensitivity troponin 39.   No prior heart history.  She has had nephrolithiasis in the past with ureteral stenting.  She is currently working as a Surveyor, minerals and a Psychologist, sport and exercise is at.  Chest x-ray looks unremarkable.  Bedside portable echocardiogram shows EF 50 to 55% with what appears to be mild inferolateral hypokinesis.  Windows were challenging.  No evidence of pericardial effusion.  Currently in our ER she is resting comfortably.  GEN: Well nourished, well developed, in no acute  distress  HEENT: normal  Neck: no JVD, carotid bruits, or masses Cardiac: RRR; no murmurs, rubs, or gallops,no edema  Respiratory:  clear to auscultation bilaterally, normal work of breathing GI: soft, nontender, nondistended, + BS MS: no deformity or atrophy  Skin: warm and dry, no rash, tattoos noted, facial piercings noted Neuro:  Alert and Oriented x 3, Strength and sensation are intact Psych: euthymic mood, full affect  Creatinine 0.64 potassium 4.2  Assessment and plan  Non-ST elevation myocardial infarction Ventricular tachycardia Untreated multiple sclerosis Essential hypertension Tobacco use/occasional marijuana use  -With her ST segment depressions noted on ECG, nonsustained ventricular tachycardia, centralized chest discomfort with diaphoresis radiation to her left arm, we will go ahead and proceed with cardiac catheterization.  It is possible that she may have vasospasm.  She has not had a Covid vaccine.  She has not been around any Covid positive individuals.  She has not had a cough fevers or chills. -Possible inferolateral hypokinesis on portable echo.  Challenging windows.  Overall EF may be 50 to 55%. -We will continue with her IV amiodarone which was started at [facility] until post catheterization at the least. -Risks and benefits of cardiac catheterization of been explained including stroke heart attack death renal impairment bleeding.  She is willing to proceed. -Checking labs including TSH magnesium hemoglobin A1c lipid panel -Starting statin and beta-blocker.  Critical care time 76 minutes-spent with discussion with ER physician Dr. Tami Ribas at [facility], interpretation of multiple data points including EKG telemetry, discussion with patient, cardiac team, Cath Lab interventionalist.    Donato Schultz, MD

## 2019-07-03 NOTE — Progress Notes (Signed)
Echocardiogram 2D Echocardiogram has been performed.  Warren Lacy Grace Castillo 07/03/2019, 2:40 PM   Dr. Cristal Deer notified of stat echo at 2:40

## 2019-07-03 NOTE — Progress Notes (Signed)
ANTICOAGULATION CONSULT NOTE - Initial Consult  Pharmacy Consult for Angiomax + Aggrastat Indication: Post-cath  Allergies  Allergen Reactions  . Pork-Derived Products Anaphylaxis  . Sulfa Antibiotics Nausea And Vomiting    Patient Measurements: Height: 5' 2.5" (158.8 cm) Weight: 74 kg (163 lb 2.3 oz) IBW/kg (Calculated) : 51.25  Vital Signs: Temp: 97.5 F (36.4 C) (05/17 0829) Temp Source: Oral (05/17 0829) BP: 121/89 (05/17 1900) Pulse Rate: 71 (05/17 1900)  Labs: Recent Labs    07/03/19 0853 07/03/19 1916  HGB 15.0 13.0  HCT 44.3 38.6  PLT 235 210  CREATININE 0.64  --   TROPONINIHS 39*  --     Estimated Creatinine Clearance: 91.8 mL/min (by C-G formula based on SCr of 0.64 mg/dL).   Medical History: Past Medical History:  Diagnosis Date  . Hypertension   . MS (multiple sclerosis) (H[facility])     Medications:  Infusions:  . sodium chloride 50 mL/hr at 07/03/19 1900  . sodium chloride    . amiodarone 30 mg/hr (07/03/19 1900)  . bivalirudin (ANGIOMAX) infusion 5 mg/mL (Cath Lab,ACS,PCI indication)    . nitroGLYCERIN 10 mcg/min (07/03/19 1900)  . tirofiban      Assessment: 5 YOF who presented on 5/17 with NSTEMI and is s/p cath which showed multivessel CAD, including mild to moderate diffuse proximal LAD disease, acute plaque rupture with thrombus involving the ostial/proximal LCx with 90% stenosis, and 40% tubular lesion in the proximal RCA. The patient had a complicated PCI and is now to continue Angiomax x 2 hours post-cath and Aggrastat for 18 hours post-cath.   Goal of Therapy:  Appropriate anticoagulation s/p cath   Plan:  - Continue Angiomax at 1.75 mcg/kg/hr for 2 hours, then stop - Continue Aggrastat at 0.15 mcg/kg/min for 18 hours, then stop - Stop dates/times in place - pharmacy will sign off protocol and monitor peripherally.  Thank you for allowing pharmacy to be a part of this patient's care.  Georgina Pillion, PharmD, BCPS Clinical  Pharmacist Clinical phone for 07/03/2019: [phone] 07/03/2019 7:36 PM   **Pharmacist phone directory can now be found on [email or web address] ([email or web address]).  Listed under [facility].

## 2019-07-03 NOTE — ED Notes (Signed)
Report attempted, 2H unable to take at this time

## 2019-07-03 NOTE — ED Provider Notes (Signed)
Patient transferred from [facility] for cardiology evaluation.  She has been having chest pain and lightheadedness on and off for a few weeks.  She went to [facility] where she was found to have an abnormal EKG.  Reportedly had a brief run of V. tach and put on amiodarone.  Troponin mildly elevated at 39.  Currently denies any symptoms other than needing to urinate.  Vitals unremarkable satting 100% on room air.  Have paged cardiology for consult.  Per [facility] patient was not initiated on heparin due to possibly going to Cath Lab.  Apparently patient has already seeing cardiology she says they are taking her to the Cath Lab.  She said she is not having any symptoms and is just nervous.  .Critical Care Performed by: Terrilee Files, MD Authorized by: Terrilee Files, MD   Critical care provider statement:    Critical care time (minutes):  30   Critical care time was exclusive of:  Separately billable procedures and treating other patients   Critical care was necessary to treat or prevent imminent or life-threatening deterioration of the following conditions:  Cardiac failure   Critical care was time spent personally by me on the following activities:  Discussions with consultants, evaluation of patient's response to treatment, examination of patient, pulse oximetry, re-evaluation of patient's condition, obtaining history from patient or surrogate and review of old charts   I assumed direction of critical care for this patient from another provider in my specialty: yes       Terrilee Files, MD 07/03/19 1720

## 2019-07-03 NOTE — Interval H&P Note (Signed)
History and Physical Interval Note:  07/03/2019 2:38 PM  Grace Castillo  has presented today for surgery, with the diagnosis of unstable angina.  The various methods of treatment have been discussed with the patient and family. After consideration of risks, benefits and other options for treatment, the patient has consented to  Procedure(s): LEFT HEART CATH AND CORONARY ANGIOGRAPHY (N/A) as a surgical intervention.  The patient's history has been reviewed, patient examined, no change in status, stable for surgery.  I have reviewed the patient's chart and labs.  Questions were answered to the patient's satisfaction.    Cath Lab Visit (complete for each Cath Lab visit)  Clinical Evaluation Leading to the Procedure:   ACS: Yes.    Non-ACS:  N/A  Lilac Hoff

## 2019-07-03 NOTE — ED Triage Notes (Signed)
Pressure and sharp pains in upper middle chest since getting up this morning.  Had similar episode a couple weeks ago that lasted less than one day.  SOB.  Pain worse with deep breath. No cough.

## 2019-07-03 NOTE — Plan of Care (Signed)
  Problem: Education: Goal: Knowledge of General Education information will improve Description: Including pain rating scale, medication(s)/side effects and non-pharmacologic comfort measures Outcome: Progressing   Problem: Clinical Measurements: Goal: Will remain free from infection Outcome: Adequate for Discharge Goal: Respiratory complications will improve Outcome: Adequate for Discharge   Problem: Safety: Goal: Ability to remain free from injury will improve Outcome: Adequate for Discharge

## 2019-07-03 NOTE — ED Notes (Signed)
Pt BIB [facility], trx from [facility]. Pt c/o CP radiating to L arm, w/ SOB, significant family hx, witnessed run of Vtach by RN at [facility], self-corrected, placed on Amiodarone drip. VS stable, no c/o CP at this time.

## 2019-07-04 ENCOUNTER — Inpatient Hospital Stay (HOSPITAL_COMMUNITY): Payer: Medicaid Other

## 2019-07-04 DIAGNOSIS — I200 Unstable angina: Secondary | ICD-10-CM

## 2019-07-04 DIAGNOSIS — I472 Ventricular tachycardia: Secondary | ICD-10-CM

## 2019-07-04 LAB — CBC
HCT: 35.2 % — ABNORMAL LOW (ref 36.0–46.0)
Hemoglobin: 11.9 g/dL — ABNORMAL LOW (ref 12.0–15.0)
MCH: 33.1 pg (ref 26.0–34.0)
MCHC: 33.8 g/dL (ref 30.0–36.0)
MCV: 97.8 fL (ref 80.0–100.0)
Platelets: 182 10*3/uL (ref 150–400)
RBC: 3.6 MIL/uL — ABNORMAL LOW (ref 3.87–5.11)
RDW: 13.6 % (ref 11.5–15.5)
WBC: 11.4 10*3/uL — ABNORMAL HIGH (ref 4.0–10.5)
nRBC: 0 % (ref 0.0–0.2)

## 2019-07-04 LAB — BASIC METABOLIC PANEL
Anion gap: 10 (ref 5–15)
BUN: 6 mg/dL (ref 6–20)
CO2: 18 mmol/L — ABNORMAL LOW (ref 22–32)
Calcium: 7.7 mg/dL — ABNORMAL LOW (ref 8.9–10.3)
Chloride: 108 mmol/L (ref 98–111)
Creatinine, Ser: 0.57 mg/dL (ref 0.44–1.00)
GFR calc Af Amer: >60 mL/min (ref >60)
GFR calc non Af Amer: >60 mL/min (ref >60)
Glucose, Bld: 118 mg/dL — ABNORMAL HIGH (ref 70–99)
Potassium: 3.4 mmol/L — ABNORMAL LOW (ref 3.5–5.1)
Sodium: 136 mmol/L (ref 135–145)

## 2019-07-04 LAB — LIPID PANEL
Cholesterol: 165 mg/dL (ref 0–200)
HDL: 27 mg/dL — ABNORMAL LOW (ref >40)
LDL Cholesterol: 121 mg/dL — ABNORMAL HIGH (ref 0–99)
Total CHOL/HDL Ratio: 6.1 RATIO
Triglycerides: 83 mg/dL (ref <150)
VLDL: 17 mg/dL (ref 0–40)

## 2019-07-04 LAB — HEMOGLOBIN A1C
Hgb A1c MFr Bld: 5.2 % (ref 4.8–5.6)
Mean Plasma Glucose: 103 mg/dL

## 2019-07-04 LAB — ECHOCARDIOGRAM LIMITED
Height: 62.5 in
Weight: 2617.3 oz

## 2019-07-04 MED ORDER — POTASSIUM CHLORIDE CRYS ER 20 MEQ PO TBCR
40.0000 meq | EXTENDED_RELEASE_TABLET | Freq: Once | ORAL | Status: AC
  Administered 2019-07-04: 40 meq via ORAL
  Filled 2019-07-04: qty 2

## 2019-07-04 NOTE — Progress Notes (Signed)
  Echocardiogram 2D Echocardiogram has been performed.  Grace Castillo 07/04/2019, 9:34 AM

## 2019-07-04 NOTE — Progress Notes (Signed)
ANTICOAGULATION CONSULT NOTE  Pharmacy Consult for Angiomax + Aggrastat Indication: Post-cath  Allergies  Allergen Reactions  . Pork-Derived Products Anaphylaxis  . Sulfa Antibiotics Nausea And Vomiting    Patient Measurements: Height: 5' 2.5" (158.8 cm) Weight: 74.2 kg (163 lb 9.3 oz) IBW/kg (Calculated) : 51.25  Vital Signs: Temp: 98.3 F (36.8 C) (05/18 0403) Temp Source: Oral (05/18 0403) BP: 134/80 (05/18 0930) Pulse Rate: 94 (05/18 0930)  Labs: Recent Labs    07/03/19 0853 07/03/19 0853 07/03/19 1916 07/03/19 2208 07/04/19 0537  HGB 15.0   < > 13.0  --  11.9*  HCT 44.3  --  38.6  --  35.2*  PLT 235  --  210  --  182  CREATININE 0.64  --  0.63  --  0.57  TROPONINIHS 39*  --  6,820* 6,633*  --    < > = values in this interval not displayed.    Estimated Creatinine Clearance: 92 mL/min (by C-G formula based on SCr of 0.57 mg/dL).   Medical History: Past Medical History:  Diagnosis Date  . Hypertension   . MS (multiple sclerosis) (HCC)     Medications:  Infusions:  . sodium chloride    . nitroGLYCERIN Stopped (07/03/19 2137)  . tirofiban 0.15 mcg/kg/min (07/04/19 0900)    Assessment: 38 YOF who presented on 5/17 with NSTEMI and is s/p cath which showed multivessel CAD, including mild to moderate diffuse proximal LAD disease, acute plaque rupture with thrombus involving the ostial/proximal LCx with 90% stenosis, and 40% tubular lesion in the proximal RCA. The patient had a complicated PCI and is now to continue Angiomax x 2 hours post-cath and Aggrastat for 18 hours post-cath.   Light cp post procedure, stable overnight. Hgb trending down from 15 to 11.9 this morning, plt slightly down but overall stable at 182. No bleeding issues noted.   Goal of Therapy:  Appropriate anticoagulation s/p cath   Plan:  - Angiomax complete - Continue Aggrastat at 0.15 mcg/kg/min for 18 hours, stop date of this afternoon in place.   Thank you for allowing pharmacy to  be a part of this patient's care.  Sheppard Coil PharmD., BCPS Clinical Pharmacist 07/04/2019 10:04 AM

## 2019-07-04 NOTE — Progress Notes (Signed)
Progress Note  Patient Name: Grace Castillo Date of Encounter: 07/04/2019  Primary Cardiologist: Donato Schultz, MD   Subjective   Feels better. Light CP post procedure but better now. No SOB. Spoke to [employer] sister on phone  Inpatient Medications    Scheduled Meds:  aspirin EC  81 mg Oral Daily   atorvastatin  80 mg Oral Daily   Chlorhexidine Gluconate Cloth  6 each Topical Daily   metoprolol tartrate  12.5 mg Oral BID   sodium chloride flush  3 mL Intravenous Q12H   ticagrelor  90 mg Oral BID   Continuous Infusions:  sodium chloride     amiodarone 30 mg/hr (07/04/19 0700)   nitroGLYCERIN Stopped (07/03/19 2137)   tirofiban 0.15 mcg/kg/min (07/04/19 0700)   PRN Meds: sodium chloride, acetaminophen, morphine injection, nitroGLYCERIN, ondansetron (ZOFRAN) IV, sodium chloride flush   Vital Signs    Vitals:   07/04/19 0530 07/04/19 0600 07/04/19 0630 07/04/19 0700  BP: 113/67 104/69 111/70 123/77  Pulse: 66 61 73 63  Resp: 14 18 15 14   Temp:      TempSrc:      SpO2: 98% 98% 98% 98%  Weight:      Height:        Intake/Output Summary (Last 24 hours) at 07/04/2019 0756 Last data filed at 07/04/2019 0700 Gross per 24 hour  Intake 1161.83 ml  Output --  Net 1161.83 ml   Last 3 Weights 07/04/2019 07/03/2019 07/03/2019  Weight (lbs) 163 lb 9.3 oz 163 lb 2.3 oz 164 lb  Weight (kg) 74.2 kg 74 kg 74.39 kg      Telemetry    No VT, NSR - Personally Reviewed  ECG    NSR NSSTW changes- Personally Reviewed  Physical Exam   GEN: No acute distress.   Neck: No JVD Cardiac: RRR, no murmurs, rubs, or gallops. Cath site normal, dressed Respiratory: Clear to auscultation bilaterally. GI: Soft, nontender, non-distended  MS: No edema; No deformity. Neuro:  Nonfocal  Psych: Normal affect   Labs    High Sensitivity Troponin:   Recent Labs  Lab 07/03/19 0853 07/03/19 1916 07/03/19 2208  TROPONINIHS 39* 6,820* 6,633*      Chemistry Recent Labs  Lab  07/03/19 0853 07/03/19 1916 07/04/19 0537  NA 139  --  136  K 4.2  --  3.4*  CL 107  --  108  CO2 22  --  18*  GLUCOSE 149*  --  118*  BUN 14  --  6  CREATININE 0.64 0.63 0.57  CALCIUM 9.4  --  7.7*  GFRNONAA >60 >60 >60  GFRAA >60 >60 >60  ANIONGAP 10  --  10     Hematology Recent Labs  Lab 07/03/19 0853 07/03/19 1916 07/04/19 0537  WBC 17.1* 15.1* 11.4*  RBC 4.55 3.95 3.60*  HGB 15.0 13.0 11.9*  HCT 44.3 38.6 35.2*  MCV 97.4 97.7 97.8  MCH 33.0 32.9 33.1  MCHC 33.9 33.7 33.8  RDW 13.5 13.5 13.6  PLT 235 210 182    BNPNo results for input(s): BNP, PROBNP in the last 168 hours.   DDimer No results for input(s): DDIMER in the last 168 hours.   Radiology    DG Chest 2 View  Result Date: 07/03/2019 CLINICAL DATA:  Chest pain and shortness of breath EXAM: CHEST - 2 VIEW COMPARISON:  None. FINDINGS: Lungs are clear. The heart size and pulmonary vascularity are normal. No adenopathy. No pneumothorax. No bone lesions. IMPRESSION: No abnormality  noted. Electronically Signed   By: Lowella Grip III M.D.   On: 07/03/2019 09:09   CARDIAC CATHETERIZATION  Result Date: 07/03/2019 Conclusions: 1. Multivessel coronary artery disease, including mild to moderate diffuse proximal LAD disease, acute plaque rupture with thrombus involving the ostial/proximal LCx with 90% stenosis, and 40% tubular lesion in the proximal RCA. 2. Mildly elevated left ventricular filling pressure. 3. Complex IVUS guided PCI to the ostial/proximal LCx leading to significant plaque shift and thrombus formation in the ostial LAD requiring bifurcation stenting of the distal LMCA into the LAD and LCx using a crush technique (Resolute Onyx 3.0 x 22 mm drug-eluting stent in the LMCA/LAD and Resolute Onyx 3.5 x 12 mm drug-eluting stent in the LCx).  There was also embolization of occlusive thrombus into the mid LAD, successfully treated with balloon angioplasty. Recommendations: 1. Continue bivalirudin infusion for  2 hours and tirofiban infusion for 18 hours. 2. Titrate nitroglycerin infusion for relief of chest pain. 3. Dual antiplatelet therapy with aspirin and ticagrelor for at least 12 months, ideally longer in light of concern for hypercoagulable state given significant intracoronary thrombus formation despite adequate anticoagulation during the procedure. 4. Obtain a limited echo tomorrow to reassess LVEF following complex PCI leading to temporary closure of the LAD and LCx. 5. Aggressive secondary prevention, including high intensity statin therapy and smoking cessation. Nelva Bush, MD [facility]   ECHOCARDIOGRAM COMPLETE  Result Date: 07/03/2019    ECHOCARDIOGRAM REPORT   Patient Name:   Grace Castillo Date of Exam: 07/03/2019 Medical Rec #:  [id number]    Height:       62.5 in Accession #:    [id number]   Weight:       164.0 lb Date of Birth:  [birth year]    BSA:          1.768 m Patient Age:    39 years     BP:           146/84 mmHg Patient Gender: F            HR:           56 bpm. Exam Location:  Inpatient Procedure: 2D Echo, Color Doppler and Cardiac Doppler STAT ECHO Indications:    I47.2 Ventricular tachycardia  History:        Patient has no prior history of Echocardiogram examinations.                 Risk Factors:Hypertension.  Sonographer:    Raquel Sarna Senior RDCS Referring Phys: [id number] [place]  1. Left ventricular ejection fraction, by estimation, is 50 to 55%. The left ventricle has low normal function. The left ventricle has no regional wall motion abnormalities. Left ventricular diastolic parameters were normal.  2. Right ventricular systolic function is normal. The right ventricular size is normal. Tricuspid regurgitation signal is inadequate for assessing PA pressure.  3. The mitral valve is normal in structure. Trivial mitral valve regurgitation. No evidence of mitral stenosis.  4. The aortic valve has an indeterminant number of cusps. Aortic valve regurgitation is not  visualized. No aortic stenosis is present.  5. The inferior vena cava is normal in size with greater than 50% respiratory variability, suggesting right atrial pressure of 3 mmHg. Comparison(s): No prior Echocardiogram. Conclusion(s)/Recommendation(s): Low normal EF. There are no clear wall motion abnormalities that are consistent across views, but the inferior/inferoseptal/inferolateral walls appear to thicken less than the other walls. FINDINGS  Left Ventricle: Left ventricular ejection fraction, by  estimation, is 50 to 55%. The left ventricle has low normal function. The left ventricle has no regional wall motion abnormalities. The left ventricular internal cavity size was normal in size. There is no left ventricular hypertrophy. Left ventricular diastolic parameters were normal. Right Ventricle: The right ventricular size is normal. No increase in right ventricular wall thickness. Right ventricular systolic function is normal. Tricuspid regurgitation signal is inadequate for assessing PA pressure. Left Atrium: Left atrial size was normal in size. Right Atrium: Right atrial size was normal in size. Pericardium: There is no evidence of pericardial effusion. Mitral Valve: The mitral valve is normal in structure. Trivial mitral valve regurgitation. No evidence of mitral valve stenosis. Tricuspid Valve: The tricuspid valve is normal in structure. Tricuspid valve regurgitation is trivial. No evidence of tricuspid stenosis. Aortic Valve: The aortic valve has an indeterminant number of cusps. Aortic valve regurgitation is not visualized. No aortic stenosis is present. Pulmonic Valve: The pulmonic valve was not well visualized. Pulmonic valve regurgitation is not visualized. No evidence of pulmonic stenosis. Aorta: The aortic root, ascending aorta and aortic arch are all structurally normal, with no evidence of dilitation or obstruction. Venous: The inferior vena cava is normal in size with greater than 50% respiratory  variability, suggesting right atrial pressure of 3 mmHg. IAS/Shunts: No atrial level shunt detected by color flow Doppler.  LEFT VENTRICLE PLAX 2D LVIDd:         4.60 cm  Diastology LVIDs:         3.40 cm  LV e' lateral:   9.25 cm/s LV PW:         0.90 cm  LV E/e' lateral: 10.1 LV IVS:        0.70 cm  LV e' medial:    8.05 cm/s LVOT diam:     2.00 cm  LV E/e' medial:  11.7 LV SV:         53 LV SV Index:   30 LVOT Area:     3.14 cm  RIGHT VENTRICLE RV S prime:     11.00 cm/s TAPSE (M-mode): 1.8 cm LEFT ATRIUM             Index       RIGHT ATRIUM           Index LA diam:        3.10 cm 1.75 cm/m  RA Area:     10.90 cm LA Vol (A2C):   39.7 ml 22.46 ml/m RA Volume:   22.80 ml  12.90 ml/m LA Vol (A4C):   25.7 ml 14.54 ml/m LA Biplane Vol: 33.5 ml 18.95 ml/m  AORTIC VALVE LVOT Vmax:   75.70 cm/s LVOT Vmean:  49.700 cm/s LVOT VTI:    0.168 m  AORTA Ao Root diam: 2.50 cm Ao Asc diam:  2.50 cm MITRAL VALVE MV Area (PHT): 3.77 cm    SHUNTS MV Decel Time: 201 msec    Systemic VTI:  0.17 m MV E velocity: 93.80 cm/s  Systemic Diam: 2.00 cm MV A velocity: 52.70 cm/s MV E/A ratio:  1.78 Jodelle Red MD Electronically signed by Jodelle Red MD Signature Date/Time: 07/03/2019/2:54:48 PM    Final     Cardiac Studies   Cath 07/03/19: Conclusions: 1. Multivessel coronary artery disease, including mild to moderate diffuse proximal LAD disease, acute plaque rupture with thrombus involving the ostial/proximal LCx with 90% stenosis, and 40% tubular lesion in the proximal RCA. 2. Mildly elevated left ventricular filling pressure. 3. Complex IVUS  guided PCI to the ostial/proximal LCx leading to significant plaque shift and thrombus formation in the ostial LAD requiring bifurcation stenting of the distal LMCA into the LAD and LCx using a crush technique (Resolute Onyx 3.0 x 22 mm drug-eluting stent in the LMCA/LAD and Resolute Onyx 3.5 x 12 mm drug-eluting stent in the LCx).  There was also embolization of  occlusive thrombus into the mid LAD, successfully treated with balloon angioplasty.  Recommendations: 1. Continue bivalirudin infusion for 2 hours and tirofiban infusion for 18 hours. 2. Titrate nitroglycerin infusion for relief of chest pain. 3. Dual antiplatelet therapy with aspirin and ticagrelor for at least 12 months, ideally longer in light of concern for hypercoagulable state given significant intracoronary thrombus formation despite adequate anticoagulation during the procedure. 4. Obtain a limited echo tomorrow to reassess LVEF following complex PCI leading to temporary closure of the LAD and LCx. 5. Aggressive secondary prevention, including high intensity statin therapy and smoking cessation.  Yvonne Kendall, MD  Diagnostic Dominance: Right  Intervention     ECHO 06/23/19: 1. Left ventricular ejection fraction, by estimation, is 50 to 55%. The  left ventricle has low normal function. The left ventricle has no regional  wall motion abnormalities. Left ventricular diastolic parameters were  normal.  2. Right ventricular systolic function is normal. The right ventricular  size is normal. Tricuspid regurgitation signal is inadequate for assessing  PA pressure.  3. The mitral valve is normal in structure. Trivial mitral valve  regurgitation. No evidence of mitral stenosis.  4. The aortic valve has an indeterminant number of cusps. Aortic valve  regurgitation is not visualized. No aortic stenosis is present.  5. The inferior vena cava is normal in size with greater than 50%  respiratory variability, suggesting right atrial pressure of 3 mmHg.   Comparison(s): No prior Echocardiogram.   Conclusion(s)/Recommendation(s): Low normal EF. There are no clear wall  motion abnormalities that are consistent across views, but the  inferior/inferoseptal/inferolateral walls appear to thicken less than the  other walls.   Patient Profile     39 y.o. female with  NSTEMI  Assessment & Plan    NSTEMI  - circumflex tubular 90% lesion originally--> Circ stent, LAD stent into LM. Crush balloon.  Thrombus burden --> 2b3a 18 hours. Trop 6000.   - ASA Brilinta 6-12 months, statin (LDL 121)  - EF 50% inferior/inferoseptal/inferolateral walls   Tobacco use  - cessation  Multiple sclerosis  - R eye partial blindness originally. Has been off of meds for >3 years  NSVT  - AMIO drip since onset. Will DC now. Ischemic origin. EF 50%  Cardiac rehab.    For questions or updates, please contact [facility] Please consult [email or web address] for contact info under        Signed, Donato Schultz, MD  07/04/2019, 7:56 AM

## 2019-07-04 NOTE — Progress Notes (Signed)
Came to ambulate however pt declined as she is resting after lack of sleep last night. Encouraged her to walk with staff later today, which she agreed to. Discussed MI, stents, Brilinta, restrictions, smoking cessation, and CRPII. Pt fairly receptive but we will reiterate education tomorrow.  [id number] Ethelda Chick CES, ACSM 2:10 PM 07/04/2019

## 2019-07-05 ENCOUNTER — Encounter (HOSPITAL_COMMUNITY): Payer: Self-pay | Admitting: Cardiology

## 2019-07-05 DIAGNOSIS — I2510 Atherosclerotic heart disease of native coronary artery without angina pectoris: Secondary | ICD-10-CM

## 2019-07-05 DIAGNOSIS — I214 Non-ST elevation (NSTEMI) myocardial infarction: Secondary | ICD-10-CM

## 2019-07-05 MED ORDER — ASPIRIN 81 MG PO TBEC: 81.0000 mg | DELAYED_RELEASE_TABLET | Freq: Every day | ORAL | 0 refills | Status: DC

## 2019-07-05 MED ORDER — ATORVASTATIN CALCIUM 80 MG PO TABS: 80.0000 mg | ORAL_TABLET | Freq: Every day | ORAL | 1 refills | Status: DC

## 2019-07-05 MED ORDER — NITROGLYCERIN 0.4 MG SL SUBL: 0.4000 mg | SUBLINGUAL_TABLET | SUBLINGUAL | 2 refills | Status: DC | PRN

## 2019-07-05 MED ORDER — TICAGRELOR 90 MG PO TABS: 90.0000 mg | ORAL_TABLET | Freq: Two times a day (BID) | ORAL | 2 refills | Status: DC

## 2019-07-05 MED ORDER — METOPROLOL TARTRATE 25 MG PO TABS: 12.5000 mg | ORAL_TABLET | Freq: Two times a day (BID) | ORAL | 0 refills | Status: DC

## 2019-07-05 MED FILL — METOPROLOL TARTRATE 25 MG T: 25 | 90 days supply | Qty: 90 | Fill #0

## 2019-07-05 MED FILL — ATORVASTATIN CALCIUM 80 MG: 80 | 90 days supply | Qty: 90 | Fill #0

## 2019-07-05 MED FILL — NITROGLYCERIN 0.4 MG TAB SL: 0.4 | 7 days supply | Qty: 25 | Fill #0

## 2019-07-05 MED FILL — BRILINTA 90 MG TABLET: 90 | 30 days supply | Qty: 60 | Fill #0

## 2019-07-05 MED FILL — ASPIRIN LOW DOSE 81 MG TBEC: 81 | 90 days supply | Qty: 90 | Fill #0

## 2019-07-05 NOTE — TOC Initial Note (Signed)
Transition of Care [facility]) - Initial/Assessment Note    Patient Details  Name: Grace Castillo MRN: [id number] Date of Birth: Dec 05, 1980  Transition [facility] CM/SW Contact:    Curlene Labrum, RN Phone Number: 07/05/2019, 11:52 AM  Clinical Narrative:                 Case management met with the patient prior to discharge.  Spoke with the patient and she is going to be staying with her sister, who is a physician here at [place].  Medications will be filled through the [facility].  Patient set up with PCP with [facility] and visit scheduled and placed in discharge instructions.  Expected Discharge Plan: Home/Self Care Barriers to Discharge: (PCP appointment made for the patient with [facility].)   Patient Goals and CMS Choice Patient states their goals for this hospitalization and ongoing recovery are:: I'm ready to go home today and stay with my sister.      Expected Discharge Plan and Services Expected Discharge Plan: Home/Self Care In-house Referral: PCP / [facility] Discharge Planning Services: CM Consult, Medication Assistance, Follow-up appt scheduled Post Acute Care Choice: NA   Expected Discharge Date: 07/05/19                                    Prior Living Arrangements/Services   Lives with:: Relatives Patient language and need for interpreter reviewed:: Yes Do you feel safe going back to the place where you live?: Yes      Need for Family Participation in Patient Care: Yes (Comment) Care giver support system in place?: Yes (comment)   Criminal Activity/Legal Involvement Pertinent to Current Situation/Hospitalization: No - Comment as needed  Activities of Daily Living      Permission Sought/Granted Permission sought to share information with : Case Manager Permission granted to share information with : Yes, Verbal Permission Granted     Permission granted to share info w AGENCY: TOC pharmacy, [facility]        Emotional  Assessment Appearance:: Appears stated age Attitude/Demeanor/Rapport: Gracious Affect (typically observed): Accepting Orientation: : Oriented to Self, Oriented to Place, Oriented to  Time, Oriented to Situation Alcohol / Substance Use: Not Applicable Psych Involvement: No (comment)  Admission diagnosis:  Unstable angina (HCC) [I20.0] Ventricular tachycardia (HCC) [I47.2] Acute coronary syndrome [facility]) [I24.9] Patient Active Problem List   Diagnosis Date Noted  . CAD (coronary artery disease) 07/05/2019  . Non-ST elevation (NSTEMI) myocardial infarction ([place]) 07/05/2019  . Unstable angina ([place]) 07/03/2019  . Ventricular tachycardia ([place])    PCP:  Patient, No Pcp Per Pharmacy:   [id number] - [place], [place] - [address] AT [place] [address] [place] [address] Phone: [phone] Fax: [phone]  [facility] [place], [place] [address] [place] [address] Phone: [phone] Fax: [phone]     Social Determinants of Health (SDOH) Interventions    Readmission Risk Interventions Readmission Risk Prevention Plan 07/05/2019  Post Dischage Appt Complete  Medication Screening Complete  Transportation Screening Complete

## 2019-07-05 NOTE — Progress Notes (Signed)
Progress Note  Patient Name: Grace Castillo Date of Encounter: 07/05/2019  Primary Cardiologist: Donato Schultz, MD   Subjective   Feels better.  Overall had a reasonable night.  No chest pain no fevers chills nausea vomiting syncope bleeding.  Inpatient Medications    Scheduled Meds: . aspirin EC  81 mg Oral Daily  . atorvastatin  80 mg Oral Daily  . Chlorhexidine Gluconate Cloth  6 each Topical Daily  . metoprolol tartrate  12.5 mg Oral BID  . sodium chloride flush  3 mL Intravenous Q12H  . ticagrelor  90 mg Oral BID   Continuous Infusions: . sodium chloride    . nitroGLYCERIN Stopped (07/03/19 2137)   PRN Meds: sodium chloride, acetaminophen, nitroGLYCERIN, ondansetron (ZOFRAN) IV, sodium chloride flush   Vital Signs    Vitals:   07/04/19 2330 07/05/19 0006 07/05/19 0403 07/05/19 0500  BP:  (!) 93/52 103/66   Pulse:  71 79   Resp:  16 15   Temp: 98.5 F (36.9 C)  98.4 F (36.9 C)   TempSrc: Oral  Oral   SpO2:  98% 100%   Weight:    72.8 kg  Height:        Intake/Output Summary (Last 24 hours) at 07/05/2019 0844 Last data filed at 07/05/2019 0000 Gross per 24 hour  Intake 323.32 ml  Output --  Net 323.32 ml   Last 3 Weights 07/05/2019 07/04/2019 07/03/2019  Weight (lbs) 160 lb 7.9 oz 163 lb 9.3 oz 163 lb 2.3 oz  Weight (kg) 72.8 kg 74.2 kg 74 kg      Telemetry    No adverse arrhythmias no further VT normal sinus rhythm- Personally Reviewed  ECG    T wave inversions more notable in the inferolateral leads.,  Sinus rhythm- Personally Reviewed  Physical Exam   GEN: No acute distress.   Neck: No JVD Cardiac: RRR, no murmurs, rubs, or gallops.  Respiratory: Clear to auscultation bilaterally. GI: Soft, nontender, non-distended  MS: No edema; No deformity. Neuro:  Nonfocal  Psych: Normal affect   Labs    High Sensitivity Troponin:   Recent Labs  Lab 07/03/19 0853 07/03/19 1916 07/03/19 2208  TROPONINIHS 39* 6,820* 6,633*      Chemistry Recent  Labs  Lab 07/03/19 0853 07/03/19 1916 07/04/19 0537  NA 139  --  136  K 4.2  --  3.4*  CL 107  --  108  CO2 22  --  18*  GLUCOSE 149*  --  118*  BUN 14  --  6  CREATININE 0.64 0.63 0.57  CALCIUM 9.4  --  7.7*  GFRNONAA >60 >60 >60  GFRAA >60 >60 >60  ANIONGAP 10  --  10     Hematology Recent Labs  Lab 07/03/19 0853 07/03/19 1916 07/04/19 0537  WBC 17.1* 15.1* 11.4*  RBC 4.55 3.95 3.60*  HGB 15.0 13.0 11.9*  HCT 44.3 38.6 35.2*  MCV 97.4 97.7 97.8  MCH 33.0 32.9 33.1  MCHC 33.9 33.7 33.8  RDW 13.5 13.5 13.6  PLT 235 210 182    BNPNo results for input(s): BNP, PROBNP in the last 168 hours.   DDimer No results for input(s): DDIMER in the last 168 hours.   Radiology    DG Chest 2 View  Result Date: 07/03/2019 CLINICAL DATA:  Chest pain and shortness of breath EXAM: CHEST - 2 VIEW COMPARISON:  None. FINDINGS: Lungs are clear. The heart size and pulmonary vascularity are normal. No adenopathy. No  pneumothorax. No bone lesions. IMPRESSION: No abnormality noted. Electronically Signed   By: Lowella Grip III M.D.   On: 07/03/2019 09:09   CARDIAC CATHETERIZATION  Result Date: 07/03/2019 Conclusions: 1. Multivessel coronary artery disease, including mild to moderate diffuse proximal LAD disease, acute plaque rupture with thrombus involving the ostial/proximal LCx with 90% stenosis, and 40% tubular lesion in the proximal RCA. 2. Mildly elevated left ventricular filling pressure. 3. Complex IVUS guided PCI to the ostial/proximal LCx leading to significant plaque shift and thrombus formation in the ostial LAD requiring bifurcation stenting of the distal LMCA into the LAD and LCx using a crush technique (Resolute Onyx 3.0 x 22 mm drug-eluting stent in the LMCA/LAD and Resolute Onyx 3.5 x 12 mm drug-eluting stent in the LCx).  There was also embolization of occlusive thrombus into the mid LAD, successfully treated with balloon angioplasty. Recommendations: 1. Continue bivalirudin  infusion for 2 hours and tirofiban infusion for 18 hours. 2. Titrate nitroglycerin infusion for relief of chest pain. 3. Dual antiplatelet therapy with aspirin and ticagrelor for at least 12 months, ideally longer in light of concern for hypercoagulable state given significant intracoronary thrombus formation despite adequate anticoagulation during the procedure. 4. Obtain a limited echo tomorrow to reassess LVEF following complex PCI leading to temporary closure of the LAD and LCx. 5. Aggressive secondary prevention, including high intensity statin therapy and smoking cessation. Nelva Bush, MD [facility]   ECHOCARDIOGRAM COMPLETE  Result Date: 07/03/2019    ECHOCARDIOGRAM REPORT   Patient Name:   Grace Castillo Date of Exam: 07/03/2019 Medical Rec #:  [id number]    Height:       62.5 in Accession #:    [id number]   Weight:       164.0 lb Date of Birth:  09-Nov-1980    BSA:          1.768 m Patient Age:    39 years     BP:           146/84 mmHg Patient Gender: F            HR:           56 bpm. Exam Location:  Inpatient Procedure: 2D Echo, Color Doppler and Cardiac Doppler STAT ECHO Indications:    I47.2 Ventricular tachycardia  History:        Patient has no prior history of Echocardiogram examinations.                 Risk Factors:Hypertension.  Sonographer:    Raquel Sarna Senior RDCS Referring Phys: [id number] [place]  1. Left ventricular ejection fraction, by estimation, is 50 to 55%. The left ventricle has low normal function. The left ventricle has no regional wall motion abnormalities. Left ventricular diastolic parameters were normal.  2. Right ventricular systolic function is normal. The right ventricular size is normal. Tricuspid regurgitation signal is inadequate for assessing PA pressure.  3. The mitral valve is normal in structure. Trivial mitral valve regurgitation. No evidence of mitral stenosis.  4. The aortic valve has an indeterminant number of cusps. Aortic valve  regurgitation is not visualized. No aortic stenosis is present.  5. The inferior vena cava is normal in size with greater than 50% respiratory variability, suggesting right atrial pressure of 3 mmHg. Comparison(s): No prior Echocardiogram. Conclusion(s)/Recommendation(s): Low normal EF. There are no clear wall motion abnormalities that are consistent across views, but the inferior/inferoseptal/inferolateral walls appear to thicken less than the other walls. FINDINGS  Left Ventricle: Left ventricular ejection fraction, by estimation, is 50 to 55%. The left ventricle has low normal function. The left ventricle has no regional wall motion abnormalities. The left ventricular internal cavity size was normal in size. There is no left ventricular hypertrophy. Left ventricular diastolic parameters were normal. Right Ventricle: The right ventricular size is normal. No increase in right ventricular wall thickness. Right ventricular systolic function is normal. Tricuspid regurgitation signal is inadequate for assessing PA pressure. Left Atrium: Left atrial size was normal in size. Right Atrium: Right atrial size was normal in size. Pericardium: There is no evidence of pericardial effusion. Mitral Valve: The mitral valve is normal in structure. Trivial mitral valve regurgitation. No evidence of mitral valve stenosis. Tricuspid Valve: The tricuspid valve is normal in structure. Tricuspid valve regurgitation is trivial. No evidence of tricuspid stenosis. Aortic Valve: The aortic valve has an indeterminant number of cusps. Aortic valve regurgitation is not visualized. No aortic stenosis is present. Pulmonic Valve: The pulmonic valve was not well visualized. Pulmonic valve regurgitation is not visualized. No evidence of pulmonic stenosis. Aorta: The aortic root, ascending aorta and aortic arch are all structurally normal, with no evidence of dilitation or obstruction. Venous: The inferior vena cava is normal in size with greater  than 50% respiratory variability, suggesting right atrial pressure of 3 mmHg. IAS/Shunts: No atrial level shunt detected by color flow Doppler.  LEFT VENTRICLE PLAX 2D LVIDd:         4.60 cm  Diastology LVIDs:         3.40 cm  LV e' lateral:   9.25 cm/s LV PW:         0.90 cm  LV E/e' lateral: 10.1 LV IVS:        0.70 cm  LV e' medial:    8.05 cm/s LVOT diam:     2.00 cm  LV E/e' medial:  11.7 LV SV:         53 LV SV Index:   30 LVOT Area:     3.14 cm  RIGHT VENTRICLE RV S prime:     11.00 cm/s TAPSE (M-mode): 1.8 cm LEFT ATRIUM             Index       RIGHT ATRIUM           Index LA diam:        3.10 cm 1.75 cm/m  RA Area:     10.90 cm LA Vol (A2C):   39.7 ml 22.46 ml/m RA Volume:   22.80 ml  12.90 ml/m LA Vol (A4C):   25.7 ml 14.54 ml/m LA Biplane Vol: 33.5 ml 18.95 ml/m  AORTIC VALVE LVOT Vmax:   75.70 cm/s LVOT Vmean:  49.700 cm/s LVOT VTI:    0.168 m  AORTA Ao Root diam: 2.50 cm Ao Asc diam:  2.50 cm MITRAL VALVE MV Area (PHT): 3.77 cm    SHUNTS MV Decel Time: 201 msec    Systemic VTI:  0.17 m MV E velocity: 93.80 cm/s  Systemic Diam: 2.00 cm MV A velocity: 52.70 cm/s MV E/A ratio:  1.78 Jodelle Red MD Electronically signed by Jodelle Red MD Signature Date/Time: 07/03/2019/2:54:48 PM    Final    ECHOCARDIOGRAM LIMITED  Result Date: 07/04/2019    ECHOCARDIOGRAM LIMITED REPORT   Patient Name:   Grace Castillo Date of Exam: 07/04/2019 Medical Rec #:  [id number]    Height:       62.5 in Accession #:  [id number]   Weight:       163.6 lb Date of Birth:  [birth year]    BSA:          1.766 m Patient Age:    38 years     BP:           134/80 mmHg Patient Gender: F            HR:           65 bpm. Exam Location:  Inpatient Procedure: Limited Echo, Cardiac Doppler and Color Doppler Indications:    Acute Cornonary Syndrome I24.9  History:        Patient has prior history of Echocardiogram examinations, most                 recent 07/03/2019. Ventricular tachycardia.  Sonographer:    Leeroy Bock  [place] Referring Phys: [id number] [place] IMPRESSIONS  1. Left ventricular ejection fraction, by estimation, is 55 to 60%. The left ventricle has normal function. The left ventricle demonstrates regional wall motion abnormalities (see scoring diagram/findings for description). There is mild hypokinesis of the left ventricular, basal-mid inferolateral wall.  2. The mitral valve is grossly normal. Trivial mitral valve regurgitation.  3. The aortic valve is tricuspid. Aortic valve regurgitation is not visualized.  4. The inferior vena cava is dilated in size with <50% respiratory variability, suggesting right atrial pressure of 15 mmHg. FINDINGS  Left Ventricle: Left ventricular ejection fraction, by estimation, is 55 to 60%. The left ventricle has normal function. The left ventricle demonstrates regional wall motion abnormalities. Mild hypokinesis of the left ventricular, basal-mid inferolateral wall. The left ventricular internal cavity size was normal in size. There is no left ventricular hypertrophy. Mitral Valve: The mitral valve is grossly normal. Trivial mitral valve regurgitation. Aortic Valve: The aortic valve is tricuspid. Aortic valve regurgitation is not visualized. Venous: The inferior vena cava is dilated in size with less than 50% respiratory variability, suggesting right atrial pressure of 15 mmHg.  LEFT VENTRICLE PLAX 2D LVIDd:         5.02 cm LVIDs:         3.15 cm LV PW:         0.83 cm LV IVS:        0.83 cm  LV Volumes (MOD) LV vol d, MOD A2C: 75.0 ml LV vol d, MOD A4C: 106.0 ml LV vol s, MOD A2C: 35.9 ml LV vol s, MOD A4C: 46.6 ml LV SV MOD A2C:     39.1 ml LV SV MOD A4C:     106.0 ml LV SV MOD BP:      53.3 ml LEFT ATRIUM         Index LA diam:    3.10 cm 1.76 cm/m   AORTA Ao Root diam: 2.40 cm MITRAL VALVE MV Area (PHT): 3.68 cm MV Decel Time: 206 msec MV E velocity: 104.00 cm/s MV A velocity: 67.70 cm/s MV E/A ratio:  1.54 Zoila Shutter MD Electronically signed by Zoila Shutter MD  Signature Date/Time: 07/04/2019/10:02:05 AM    Final     Cardiac Studies   Echo-see below  Patient Profile     39 y.o. female non-STEMI  Assessment & Plan    Non-STEMI -- circumflex tubular 90% lesion originally--> Circ stent, LAD stent into LM. Crush balloon.  Thrombus burden --> 2b3a 18 hours. Trop 6000.   - ASA Brilinta 6-12 months, statin (LDL 121)  - EF 50-55% inferior/inferoseptal/inferolateral walls  even post catheterization  EF appears fairly normal. -Cardiac rehab  Nonsustained ventricular tachycardia -Also noted at [facility], increased her symptoms.  Was transiently on amiodarone drip overnight post catheterization.  No further recurrence.  Low-dose beta-blocker.  Off of amiodarone.  Multiple sclerosis - R eye partial blindness originally. Has been off of meds for >3 years  Tobacco use -Continue to counsel on cessation.  Discussed with her yesterday as well as her sister on the telephone.  I think is reasonable for her after working with cardiac rehab to be discharged today.  Her ECG does show T wave inversions some evolution in the lateral leads.  She is not having any further discomfort.  Her son is turning 15 today here. She has another son that is in [place] that is graduating in [place] on the 25th.  I expressed my viewpoints on her flying currently, I would like for her to hold off on any air travel at least for the next 2 weeks.  It is not ideal for her to travel at this time.  She understands this.  If she travels by highway, she states that she could have a driver take her and that she would make frequent stops.  She does understand the risks.    For questions or updates, please contact [facility] Please consult [email or web address] for contact info under        Signed, Donato Schultz, MD  07/05/2019, 8:44 AM

## 2019-07-05 NOTE — Discharge Instructions (Signed)
Heart Attack The heart is a muscle that needs oxygen to survive. A heart attack is a condition that occurs when your heart does not get enough oxygen. When this happens, the heart muscle begins to die. This can cause permanent damage if not treated right away. A heart attack is a medical emergency. This condition may be called a myocardial infarction, or MI. It is also known as acute coronary syndrome (ACS). ACS is a term used to describe a group of conditions that affect blood flow to the heart. What are the causes? This condition may be caused by:  Atherosclerosis. This occurs when a fatty substance called plaque builds up in the arteries and blocks or reduces blood supply to the heart.  A blood clot. A blood clot can develop suddenly when plaque breaks up within an artery and blocks blood flow to the heart.  Low blood pressure.  An abnormal heartbeat (arrhythmia).  Conditions that cause a decrease of oxygen to the heart, such as anemiaorrespiratory failure.  A spasm, or severe tightening, of a blood vessel that cuts off blood flow to the heart.  Tearing of a coronary artery (spontaneous coronary artery dissection).  High blood pressure. What increases the risk? The following factors may make you more likely to develop this condition:  Aging. The older you are, the higher your risk.  Having a personal or family history of chest pain, heart attack, stroke, or narrowing of the arteries in the legs, arms, head, or stomach (peripheral artery disease).  Being female.  Smoking.  Not getting regular exercise.  Being overweight or obese.  Having high blood pressure.  Having high cholesterol (hypercholesterolemia).  Having diabetes.  Drinking too much alcohol.  Using illegal drugs, such as cocaine or methamphetamine. What are the signs or symptoms? Symptoms of this condition may vary, depending on factors like gender and age. Symptoms may include:  Chest pain. It may feel  like: ? Crushing or squeezing. ? Tightness, pressure, fullness, or heaviness.  Pain in the arm, neck, jaw, back, or upper body.  Shortness of breath.  Heartburn or upset stomach.  Nausea.  Sudden cold sweats.  Feeling tired.  Sudden light-headedness. How is this diagnosed? This condition may be diagnosed through tests, such as:  Electrocardiogram (ECG) to measure the electrical activity of your heart.  Blood tests to check for cardiac markers. These chemicals are released by a damaged heart muscle.  A test to evaluate blood flow and heart function (coronary angiogram).  CT scan to see the heart more clearly.  A test to evaluate the pumping action of the heart (echocardiogram). How is this treated? A heart attack must be treated as soon as possible. Treatment may include:  Medicines to: ? Break up or dissolve blood clots (fibrinolytic therapy). ? Thin blood and help prevent blood clots. ? Treat blood pressure. ? Improve blood flow to the heart. ? Reduce pain. ? Reduce cholesterol.  Angioplasty and stent placement. These are procedures to widen a blocked artery and keep it open.  Coronary artery bypass graft, CABG, or open heart surgery. This enables blood to flow to the heart by going around the blocked part of the artery.  Oxygen therapy if needed.  Cardiac rehabilitation. This improves your health and well-being through exercise, education, and counseling. Follow these instructions at home: Medicines  Take over-the-counter and prescription medicines only as told by your health care provider.  Do not take the following medicines unless your health care provider says it is okay  to take them: ? NSAIDs, such as ibuprofen. ? Supplements that contain vitamin A, vitamin E, or both. ? Hormone replacement therapy that contains estrogen with or without progestin. Lifestyle   Do not use any products that contain nicotine or tobacco, such as cigarettes, e-cigarettes,  and chewing tobacco. If you need help quitting, ask your health care provider.  Avoid secondhand smoke.  Exercise regularly. Ask your health care provider about participating in a cardiac rehabilitation program that helps you start exercising safely after a heart attack.  Eat a heart-healthy diet. Your health care provider will tell you what foods to eat.  Maintain a healthy weight.  Learn ways to manage stress.  Do not use illegal drugs. Alcohol use  Do not drink alcohol if: ? Your health care provider tells you not to drink. ? You are pregnant, may be pregnant, or are planning to become pregnant.  If you drink alcohol: ? Limit how much you use to:  0-1 drink a day for women.  0-2 drinks a day for men. ? Be aware of how much alcohol is in your drink. In the [place] one drink equals one 12 oz bottle of beer (355 mL), one 5 oz glass of wine (148 mL), or one 1 oz glass of hard liquor (44 mL). General instructions  Work with your health care provider to manage any other conditions you have, such as high blood pressure or diabetes. These conditions affect your heart.  Get screened for depression, and seek treatment if needed.  Keep your vaccinations up to date. Get the flu vaccine every year.  Keep all follow-up visits as told by your health care provider. This is important. Contact a health care provider if:  You feel overwhelmed or sad.  You have trouble doing your daily activities. Get help right away if:  You have sudden, unexplained discomfort in your chest, arms, back, neck, jaw, or upper body.  You have shortness of breath.  You suddenly start to sweat or your skin gets clammy.  You feel nauseous or you vomit.  You have unexplained tiredness or weakness.  You suddenly feel light-headed or dizzy.  You notice your heart starts to beat fast or feels like it is skipping beats.  You have blood pressure that is higher than 180/120. These symptoms may represent a  serious problem that is an emergency. Do not wait to see if the symptoms will go away. Get medical help right away. Call your local emergency services (911 in the [place] Do not drive yourself to the hospital. Summary  A heart attack, also called myocardial infarction, is a condition that occurs when your heart does not get enough oxygen. This is caused by anything that blocks or reduces blood flow to the heart.  Treatment is a combination of medicines and surgeries, if needed, to open the blocked arteries and restore blood flow to the heart.  A heart attack is an emergency. Get help right away if you have sudden discomfort in your chest, arms, back, neck, jaw, or upper body. Seek help if you feel nauseous, you vomit, or you feel light-headed or dizzy. This information is not intended to replace advice given to you by your health care provider. Make sure you discuss any questions you have with your health care provider. Document Revised: 05/12/2018 Document Reviewed: 05/16/2018 Elsevier Patient Education  [place].   Acute Coronary Syndrome Acute coronary syndrome (ACS) is a serious problem in which there is suddenly not enough blood and  oxygen reaching the heart. ACS can result in chest pain or a heart attack. This condition is a medical emergency. If you have any symptoms of this condition, get help right away. What are the causes? This condition may be caused by:  A buildup of fat and cholesterol inside the arteries (atherosclerosis). This is the most common cause. The buildup (plaque) can cause blood vessels in the heart (coronary arteries) to become narrow or blocked, which reduces blood flow to the heart. Plaque can also break off and lead to a clot, which can block an artery and cause a heart attack or stroke.  Sudden tightening of the muscles around the coronary arteries (coronary spasm).  Tearing of a coronary artery (spontaneous coronary artery dissection).  Very low blood  pressure (hypotension).  An abnormal heartbeat (arrhythmia).  Other medical conditions that cause a decrease of oxygen to the heart, such as anemiaorrespiratory failure.  Using cocaine or methamphetamine. What increases the risk? The following factors may make you more likely to develop this condition:  Age. The risk for ACS increases as you get older.  History of chest pain, heart attack, peripheral artery disease, or stroke.  Having taken chemotherapy or immune-suppressing medicines.  Being female.  Family history of chest pain, heart disease, or stroke.  Smoking.  Not exercising enough.  Being overweight.  High cholesterol.  High blood pressure (hypertension).  Diabetes.  Excessive alcohol use. What are the signs or symptoms? Common symptoms of this condition include:  Chest pain. The pain may last a long time, or it may stop and come back (recur). It may feel like: ? Crushing or squeezing. ? Tightness, pressure, fullness, or heaviness.  Arm, neck, jaw, or back pain.  Heartburn or indigestion.  Shortness of breath.  Nausea.  Sudden cold sweats.  Light-headedness.  Dizziness or passing out.  Tiredness (fatigue). Sometimes there are no symptoms. How is this diagnosed? This condition may be diagnosed based on:  Your medical history and symptoms.  Imaging tests, such as: ? An electrocardiogram (ECG). This measures the heart's electrical activity. ? X-rays. ? CT scan. ? A coronary angiogram. For this test, dye is injected into the heart arteries and then X-rays are taken. ? Myocardial perfusion imaging. This test shows how well blood flows through your heart muscle.  Blood tests. These may be repeated at certain time intervals.  Exercise stress testing.  Echocardiogram. This is a test that uses sound waves to produce detailed images of the heart. How is this treated? Treatment for this condition may include:  Oxygen therapy.  Medicines, such  as: ? Antiplatelet medicines and blood-thinning medicines, such as aspirin. These help prevent blood clots. ? Medicine that dissolves any blood clots (fibrinolytic therapy). ? Blood pressure medicines. ? Nitroglycerin. This helps widen blood vessels to improve blood flow. ? Pain medicine. ? Cholesterol-lowering medicine.  Surgery, such as: ? Coronary angioplasty with stent placement. This involves placing a small piece of metal that looks like mesh or a spring into a narrow coronary artery. This widens the artery and keeps it open. ? Coronary artery bypass surgery. This involves taking a section of a blood vessel from a different part of your body and placing it on the blocked coronary artery to allow blood to flow around the blockage.  Cardiac rehabilitation. This is a program that includes exercise training, education, and counseling to help you recover. Follow these instructions at home: Eating and drinking  Eat a heart-healthy diet that includes whole grains, fruits and  vegetables, lean proteins, and low-fat or nonfat dairy products.  Limit how much salt (sodium) you eat as told by your health care provider. Follow instructions from your health care provider about any other eating or drinking restrictions, such as limiting foods that are high in fat and processed sugars.  Use healthy cooking methods such as roasting, grilling, broiling, baking, poaching, steaming, or stir-frying.  Work with a dietitian to follow a heart-healthy eating plan. Medicines  Take over-the-counter and prescription medicines only as told by your health care provider.  Do not take these medicines unless your health care provider approves: ? Vitamin supplements that contain vitamin A or vitamin E. ? NSAIDs, such as ibuprofen, naproxen, or celecoxib. ? Hormone replacement therapy that contains estrogen.  If you are taking blood thinners: ? Talk with your health care provider before you take any medicines  that contain aspirin or NSAIDs. These medicines increase your risk for dangerous bleeding. ? Take your medicine exactly as told, at the same time every day. ? Avoid activities that could cause injury or bruising, and follow instructions about how to prevent falls. ? Wear a medical alert bracelet, and carry a card that lists what medicines you take. Activity  Follow your cardiac rehabilitation program. Do exercises as told by your physical therapist.  Ask your health care provider what activities and exercises are safe for you. Follow his or her instructions about lifting, driving, or climbing stairs. Lifestyle  Do not use any products that contain nicotine or tobacco, such as cigarettes, e-cigarettes, and chewing tobacco. If you need help quitting, ask your health care provider.  Do not drink alcohol if your health care provider tells you not to drink.  If you drink alcohol: ? Limit how much you have to 0-1 drink a day. ? Be aware of how much alcohol is in your drink. In the [place] one drink equals one 12 oz bottle of beer (355 mL), one 5 oz glass of wine (148 mL), or one 1 oz glass of hard liquor (44 mL).  Maintain a healthy weight. If you need to lose weight, work with your health care provider to do so safely. General instructions  Tell all the health care providers who provide care for you about your heart condition, including your dentist. This may affect the medicines or treatment you receive.  Manage any other health conditions you have, such as hypertension or diabetes. These conditions affect your heart.  Pay attention to your mental health. You may be at higher risk for depression. ? Find ways to manage stress. ? Talk to your health care provider about depression screening and treatment.  Keep your vaccinations up to date. ? Get the flu shot (influenza vaccine) every year. ? Get the pneumococcal vaccine if you are age 73 or older.  If directed, monitor your blood pressure  at home.  Keep all follow-up visits as told by your health care provider. This is important. Contact a health care provider if you:  Feel overwhelmed or sad.  Have trouble doing your daily activities. Get help right away if you:  Have pain in your chest, neck, arm, jaw, stomach, or back that recurs, and: ? It lasts for more than a few minutes. ? It is not relieved by taking the medicineyour health care provider prescribed.  Have unexplained: ? Heavy sweating. ? Heartburn or indigestion. ? Nausea or vomiting. ? Shortness of breath. ? Difficulty breathing. ? Fatigue. ? Nervousness or anxiety. ? Weakness. ? Diarrhea. ? Dark  stools or blood in your stool.  Have sudden light-headedness or dizziness.  Have blood pressure that is higher than 180/120.  Faint.  Have thoughts about hurting yourself. These symptoms may represent a serious problem that is an emergency. Do not wait to see if the symptoms will go away. Get medical help right away. Call your local emergency services (911 in the [place] Do not drive yourself to the hospital.  Summary  Acute coronary syndrome (ACS) is when there is not enough blood and oxygen being supplied to the heart. ACS can result in chest pain or a heart attack.  Acute coronary syndrome is a medical emergency. If you have any symptoms of this condition, get help right away.  Treatment includes medicines and procedures to open the blocked arteries and restore blood flow. This information is not intended to replace advice given to you by your health care provider. Make sure you discuss any questions you have with your health care provider. Document Revised: 07/06/2018 Document Reviewed: 02/14/2018 Elsevier Patient Education  2020 [employer].

## 2019-07-05 NOTE — Progress Notes (Signed)
CARDIAC REHAB PHASE I   PRE:  Rate/Rhythm: 88 SR    BP: sitting 124/72    SaO2: 100 RA  MODE:  Ambulation: 370 ft   POST:  Rate/Rhythm: 110 ST    BP: sitting 131/80     SaO2:   Tolerated well, no c/o but does st she feels more exerted than normal. Ed completed with good reception. She is planning to quit smoking but knows it will be hard. Encouraged her to discuss with her dad going outside to smoke since he smokes in the house. Gave resources. Discussed diet, exercise, NTG, and CRPII. Will refer to [facility] and [employer]. Understands the importance of meds and esp Brilinta. [id number]   Harriet Masson CES, ACSM 07/05/2019 10:30 AM

## 2019-07-05 NOTE — Plan of Care (Signed)
Problem: Education: Goal: Knowledge of General Education information will improve Description: Including pain rating scale, medication(s)/side effects and non-pharmacologic comfort measures 07/05/2019 1122 by Tenna Child, RN Outcome: Adequate for Discharge 07/05/2019 1122 by Tenna Child, RN Outcome: Adequate for Discharge 07/05/2019 1121 by Tenna Child, RN Outcome: Adequate for Discharge   Problem: Health Behavior/Discharge Planning: Goal: Ability to manage health-related needs will improve 07/05/2019 1122 by Tenna Child, RN Outcome: Adequate for Discharge 07/05/2019 1122 by Tenna Child, RN Outcome: Adequate for Discharge 07/05/2019 1121 by Tenna Child, RN Outcome: Adequate for Discharge   Problem: Clinical Measurements: Goal: Ability to maintain clinical measurements within normal limits will improve 07/05/2019 1122 by Tenna Child, RN Outcome: Adequate for Discharge 07/05/2019 1122 by Tenna Child, RN Outcome: Adequate for Discharge 07/05/2019 1121 by Tenna Child, RN Outcome: Adequate for Discharge Goal: Will remain free from infection 07/05/2019 1122 by Tenna Child, RN Outcome: Adequate for Discharge 07/05/2019 1122 by Tenna Child, RN Outcome: Adequate for Discharge 07/05/2019 1121 by Tenna Child, RN Outcome: Adequate for Discharge Goal: Diagnostic test results will improve 07/05/2019 1122 by Tenna Child, RN Outcome: Adequate for Discharge 07/05/2019 1122 by Tenna Child, RN Outcome: Adequate for Discharge 07/05/2019 1121 by Tenna Child, RN Outcome: Adequate for Discharge Goal: Respiratory complications will improve 07/05/2019 1122 by Tenna Child, RN Outcome: Adequate for Discharge 07/05/2019 1122 by Tenna Child, RN Outcome: Adequate for Discharge 07/05/2019 1121 by Tenna Child, RN Outcome: Adequate for Discharge Goal: Cardiovascular complication will be avoided 07/05/2019 1122 by Tenna Child, RN Outcome: Adequate for Discharge 07/05/2019 1122 by  Tenna Child, RN Outcome: Adequate for Discharge 07/05/2019 1121 by Tenna Child, RN Outcome: Adequate for Discharge   Problem: Activity: Goal: Risk for activity intolerance will decrease 07/05/2019 1122 by Tenna Child, RN Outcome: Adequate for Discharge 07/05/2019 1122 by Tenna Child, RN Outcome: Adequate for Discharge 07/05/2019 1121 by Tenna Child, RN Outcome: Adequate for Discharge   Problem: Nutrition: Goal: Adequate nutrition will be maintained 07/05/2019 1122 by Tenna Child, RN Outcome: Adequate for Discharge 07/05/2019 1122 by Tenna Child, RN Outcome: Adequate for Discharge 07/05/2019 1121 by Tenna Child, RN Outcome: Adequate for Discharge   Problem: Coping: Goal: Level of anxiety will decrease 07/05/2019 1122 by Tenna Child, RN Outcome: Adequate for Discharge 07/05/2019 1122 by Tenna Child, RN Outcome: Adequate for Discharge 07/05/2019 1121 by Tenna Child, RN Outcome: Adequate for Discharge   Problem: Elimination: Goal: Will not experience complications related to bowel motility 07/05/2019 1122 by Tenna Child, RN Outcome: Adequate for Discharge 07/05/2019 1122 by Tenna Child, RN Outcome: Adequate for Discharge 07/05/2019 1121 by Tenna Child, RN Outcome: Adequate for Discharge Goal: Will not experience complications related to urinary retention 07/05/2019 1122 by Tenna Child, RN Outcome: Adequate for Discharge 07/05/2019 1122 by Tenna Child, RN Outcome: Adequate for Discharge 07/05/2019 1121 by Tenna Child, RN Outcome: Adequate for Discharge   Problem: Pain Managment: Goal: General experience of comfort will improve 07/05/2019 1122 by Tenna Child, RN Outcome: Adequate for Discharge 07/05/2019 1122 by Tenna Child, RN Outcome: Adequate for Discharge 07/05/2019 1121 by Tenna Child, RN Outcome: Adequate for Discharge   Problem: Safety: Goal: Ability to remain free from injury will improve 07/05/2019 1122 by Tenna Child, RN Outcome:  Adequate for Discharge 07/05/2019 1122 by Tenna Child,  RN Outcome: Adequate for Discharge 07/05/2019 1121 by Tenna Child, RN Outcome: Adequate for Discharge   Problem: Skin Integrity: Goal: Risk for impaired skin integrity will decrease 07/05/2019 1122 by Tenna Child, RN Outcome: Adequate for Discharge 07/05/2019 1122 by Tenna Child, RN Outcome: Adequate for Discharge 07/05/2019 1121 by Tenna Child, RN Outcome: Adequate for Discharge   Problem: Education: Goal: Understanding of CV disease, CV risk reduction, and recovery process will improve 07/05/2019 1122 by Tenna Child, RN Outcome: Adequate for Discharge 07/05/2019 1122 by Tenna Child, RN Outcome: Adequate for Discharge 07/05/2019 1121 by Tenna Child, RN Outcome: Adequate for Discharge Goal: Individualized Educational Video(s) 07/05/2019 1122 by Tenna Child, RN Outcome: Adequate for Discharge 07/05/2019 1122 by Tenna Child, RN Outcome: Adequate for Discharge 07/05/2019 1121 by Tenna Child, RN Outcome: Adequate for Discharge   Problem: Activity: Goal: Ability to return to baseline activity level will improve 07/05/2019 1122 by Tenna Child, RN Outcome: Adequate for Discharge 07/05/2019 1122 by Tenna Child, RN Outcome: Adequate for Discharge 07/05/2019 1121 by Tenna Child, RN Outcome: Adequate for Discharge   Problem: Cardiovascular: Goal: Ability to achieve and maintain adequate cardiovascular perfusion will improve 07/05/2019 1122 by Tenna Child, RN Outcome: Adequate for Discharge 07/05/2019 1122 by Tenna Child, RN Outcome: Adequate for Discharge 07/05/2019 1121 by Tenna Child, RN Outcome: Adequate for Discharge Goal: Vascular access site(s) Level 0-1 will be maintained 07/05/2019 1122 by Tenna Child, RN Outcome: Adequate for Discharge 07/05/2019 1122 by Tenna Child, RN Outcome: Adequate for Discharge 07/05/2019 1121 by Tenna Child, RN Outcome: Adequate for Discharge   Problem: Health  Behavior/Discharge Planning: Goal: Ability to safely manage health-related needs after discharge will improve 07/05/2019 1122 by Tenna Child, RN Outcome: Adequate for Discharge 07/05/2019 1122 by Tenna Child, RN Outcome: Adequate for Discharge 07/05/2019 1121 by Tenna Child, RN Outcome: Adequate for Discharge   Problem: Education: Goal: Understanding of cardiac disease, CV risk reduction, and recovery process will improve 07/05/2019 1122 by Tenna Child, RN Outcome: Adequate for Discharge 07/05/2019 1122 by Tenna Child, RN Outcome: Adequate for Discharge 07/05/2019 1121 by Tenna Child, RN Outcome: Adequate for Discharge Goal: Understanding of medication regimen will improve 07/05/2019 1122 by Tenna Child, RN Outcome: Adequate for Discharge 07/05/2019 1122 by Tenna Child, RN Outcome: Adequate for Discharge 07/05/2019 1121 by Tenna Child, RN Outcome: Adequate for Discharge Goal: Individualized Educational Video(s) 07/05/2019 1122 by Tenna Child, RN Outcome: Adequate for Discharge 07/05/2019 1122 by Tenna Child, RN Outcome: Adequate for Discharge 07/05/2019 1121 by Tenna Child, RN Outcome: Adequate for Discharge   Problem: Activity: Goal: Ability to tolerate increased activity will improve 07/05/2019 1122 by Tenna Child, RN Outcome: Adequate for Discharge 07/05/2019 1122 by Tenna Child, RN Outcome: Adequate for Discharge 07/05/2019 1121 by Tenna Child, RN Outcome: Adequate for Discharge   Problem: Cardiac: Goal: Ability to achieve and maintain adequate cardiopulmonary perfusion will improve 07/05/2019 1122 by Tenna Child, RN Outcome: Adequate for Discharge 07/05/2019 1122 by Tenna Child, RN Outcome: Adequate for Discharge 07/05/2019 1121 by Tenna Child, RN Outcome: Adequate for Discharge Goal: Vascular access site(s) Level 0-1 will be maintained 07/05/2019 1122 by Tenna Child, RN Outcome: Adequate for Discharge 07/05/2019 1122 by Tenna Child,  RN Outcome: Adequate for Discharge 07/05/2019 1121 by Tenna Child, RN Outcome: Adequate for Discharge  Problem: Health Behavior/Discharge Planning: Goal: Ability to safely manage health-related needs after discharge will improve 07/05/2019 1122 by Alma Friendly, RN Outcome: Adequate for Discharge 07/05/2019 1122 by Alma Friendly, RN Outcome: Adequate for Discharge 07/05/2019 1121 by Alma Friendly, RN Outcome: Adequate for Discharge

## 2019-07-05 NOTE — Progress Notes (Signed)
Pt educated and provided discharge instructions. Pt discharged with sister in a private vehicle. All questions were answered.

## 2019-07-05 NOTE — Discharge Summary (Addendum)
Discharge Summary    Patient ID: Grace Castillo,  MRN: [id number], DOB/AGE: [birth year] 38 y.o.  Admit date: 07/03/2019 Discharge date: 07/05/2019  Primary Care Provider: Patient, No Pcp Per Primary Cardiologist: Donato Schultz, MD  Discharge Diagnoses    Active Problems:   Non-ST elevation (NSTEMI) myocardial infarction [facility])   Unstable angina (HCC)   Ventricular tachycardia (HCC)   CAD (coronary artery disease)   Allergies Allergies  Allergen Reactions  . Pork-Derived Products Anaphylaxis  . Sulfa Antibiotics Nausea And Vomiting    Diagnostic Studies/Procedures    Cath: 07/03/19  Conclusions: 1. Multivessel coronary artery disease, including mild to moderate diffuse proximal LAD disease, acute plaque rupture with thrombus involving the ostial/proximal LCx with 90% stenosis, and 40% tubular lesion in the proximal RCA. 2. Mildly elevated left ventricular filling pressure. 3. Complex IVUS guided PCI to the ostial/proximal LCx leading to significant plaque shift and thrombus formation in the ostial LAD requiring bifurcation stenting of the distal LMCA into the LAD and LCx using a crush technique (Resolute Onyx 3.0 x 22 mm drug-eluting stent in the LMCA/LAD and Resolute Onyx 3.5 x 12 mm drug-eluting stent in the LCx).  There was also embolization of occlusive thrombus into the mid LAD, successfully treated with balloon angioplasty.  Recommendations: 1. Continue bivalirudin infusion for 2 hours and tirofiban infusion for 18 hours. 2. Titrate nitroglycerin infusion for relief of chest pain. 3. Dual antiplatelet therapy with aspirin and ticagrelor for at least 12 months, ideally longer in light of concern for hypercoagulable state given significant intracoronary thrombus formation despite adequate anticoagulation during the procedure. 4. Obtain a limited echo tomorrow to reassess LVEF following complex PCI leading to temporary closure of the LAD and LCx. 5. Aggressive secondary  prevention, including high intensity statin therapy and smoking cessation.  Yvonne Kendall, MD [facility]  Diagnostic Dominance: Right  Intervention   Echo: 07/03/19  IMPRESSIONS    1. Left ventricular ejection fraction, by estimation, is 50 to 55%. The  left ventricle has low normal function. The left ventricle has no regional  wall motion abnormalities. Left ventricular diastolic parameters were  normal.  2. Right ventricular systolic function is normal. The right ventricular  size is normal. Tricuspid regurgitation signal is inadequate for assessing  PA pressure.  3. The mitral valve is normal in structure. Trivial mitral valve  regurgitation. No evidence of mitral stenosis.  4. The aortic valve has an indeterminant number of cusps. Aortic valve  regurgitation is not visualized. No aortic stenosis is present.  5. The inferior vena cava is normal in size with greater than 50%  respiratory variability, suggesting right atrial pressure of 3 mmHg.   Conclusion(s)/Recommendation(s): Low normal EF. There are no clear wall  motion abnormalities that are consistent across views, but the  inferior/inferoseptal/inferolateral walls appear to thicken less than the  other walls.  _____________   History of Present Illness     Grace Castillo is a 39 yo female with PMH of untreated MS, HTN and tobacco use who presented to [facility] with pressure and sharp chest pain in her substernal chest and shortness of breath. When she got out of the shower, chest heaviness worsened, associated with perfuse diaphoresis. Step-father made her go to the ER. CP on arrival to [facility] with shooting pain to her left arm. She was also in VT on telemetry but converted with IV amiodarone. CP started about 1 month ago and would persist for 2 hrs.  Over the last  month, she reported CP on exertion when walking up stairs, walking on flat ground, or carrying boxes.   She denied COVID infection and had not had  the COVID vaccination. She smokes cigarettes, and marijuana 2 times per week. Denied alcohol use. She does not have a relationship with her biological parents. Maternal grandfather died of MI at age 58. No sudden collapse or syncope in her lifetime. No medications for MS. 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 She works as a [employer]ar[employer] manager. [place] and had not established with neurology and had not restarted any of her home medications.  She has had 22 kidney stones requiring stents and lithotripsy.  EKG did show ST depression V4/5/6.   She was transferred from [facility] to [facility] for further management. Seen by Dr. Anne Fu on arrival. Had a bedside echo done that showed an EF of 50-55% with mild inferolateral hypokinesis. She was continued on IV amiodarone and planned to undergo cardiac cath.   Hospital Course     Consultants: None  1. Non-STEMI: Underwent cardiac cath noted above with multivessel disease and acute plaque rupture with thrombus on the pLcx with 90% stenosis and diffuse pLAD disease. Complex IVUS PCI to the osital/pLcx which lead to significant plaque shift and thrombus formation in the ostial LAD requiring bifurcation stenting of the distal LM into the LAD and LCx. There was also embolization of occlusive thrombus into the mLAD which was treated with balloon angioplasty. Trop peaked at 6000.  -- on DAPT with ASA Brilinta 6-12 months, along with statin (LDL 121) -- EF 50-55% inferior/inferoseptal/inferolateral wallson admission. Post catheterization EF appearred fairly normal. - worked well with Cardiac rehab  2. Nonsustained ventricular tachycardia: - Noted while at [facility], increased her symptoms.  Was transiently on amiodarone drip overnight post catheterization.  No further recurrence afterwards.  Tolerated addition of low-dose beta-blocker.   3. Multiple sclerosis: -R eye partial blindness originally. Has been off of meds for >3  years.   4. Tobacco use: -Continue to counsel on cessation.   5. HL: -- LDL noted at 121 and placed on high dose statin  6. HTN:  -- blood pressures were actually soft but tolerated the addition of low dose BB therapy _____________  Discharge Vitals Blood pressure 131/80, pulse 85, temperature 98.5 F (36.9 C), temperature source Oral, resp. rate (!) 32, height 5' 2.5" (1.588 m), weight 72.8 kg, last menstrual period 06/13/2019, SpO2 100 %.  Filed Weights   07/03/19 1400 07/04/19 0500 07/05/19 0500  Weight: 74 kg 74.2 kg 72.8 kg    Labs & Radiologic Studies    CBC Recent Labs    07/03/19 1916 07/04/19 0537  WBC 15.1* 11.4*  HGB 13.0 11.9*  HCT 38.6 35.2*  MCV 97.7 97.8  PLT 210 182   Basic Metabolic Panel Recent Labs    16/10/96 0853 07/03/19 0853 07/03/19 1916 07/04/19 0537  NA 139  --   --  136  K 4.2  --   --  3.4*  CL 107  --   --  108  CO2 22  --   --  18*  GLUCOSE 149*  --   --  118*  BUN 14  --   --  6  CREATININE 0.64   < > 0.63 0.57  CALCIUM 9.4  --   --  7.7*  MG 1.8  --  1.9  --    < > = values  in this interval not displayed.   Liver Function Tests No results for input(s): AST, ALT, ALKPHOS, BILITOT, PROT, ALBUMIN in the last 72 hours. No results for input(s): LIPASE, AMYLASE in the last 72 hours. Cardiac Enzymes No results for input(s): CKTOTAL, CKMB, CKMBINDEX, TROPONINI in the last 72 hours. BNP Invalid input(s): POCBNP D-Dimer No results for input(s): DDIMER in the last 72 hours. Hemoglobin A1C Recent Labs    07/03/19 1916  HGBA1C 5.2   Fasting Lipid Panel Recent Labs    07/04/19 0537  CHOL 165  HDL 27*  LDLCALC 121*  TRIG 83  CHOLHDL 6.1   Thyroid Function Tests Recent Labs    07/03/19 1916  TSH 1.607   _____________  DG Chest 2 View  Result Date: 07/03/2019 CLINICAL DATA:  Chest pain and shortness of breath EXAM: CHEST - 2 VIEW COMPARISON:  None. FINDINGS: Lungs are clear. The heart size and pulmonary vascularity  are normal. No adenopathy. No pneumothorax. No bone lesions. IMPRESSION: No abnormality noted. Electronically Signed   By: Bretta Bang III M.D.   On: 07/03/2019 09:09   CARDIAC CATHETERIZATION  Result Date: 07/03/2019 Conclusions: 1. Multivessel coronary artery disease, including mild to moderate diffuse proximal LAD disease, acute plaque rupture with thrombus involving the ostial/proximal LCx with 90% stenosis, and 40% tubular lesion in the proximal RCA. 2. Mildly elevated left ventricular filling pressure. 3. Complex IVUS guided PCI to the ostial/proximal LCx leading to significant plaque shift and thrombus formation in the ostial LAD requiring bifurcation stenting of the distal LMCA into the LAD and LCx using a crush technique (Resolute Onyx 3.0 x 22 mm drug-eluting stent in the LMCA/LAD and Resolute Onyx 3.5 x 12 mm drug-eluting stent in the LCx).  There was also embolization of occlusive thrombus into the mid LAD, successfully treated with balloon angioplasty. Recommendations: 1. Continue bivalirudin infusion for 2 hours and tirofiban infusion for 18 hours. 2. Titrate nitroglycerin infusion for relief of chest pain. 3. Dual antiplatelet therapy with aspirin and ticagrelor for at least 12 months, ideally longer in light of concern for hypercoagulable state given significant intracoronary thrombus formation despite adequate anticoagulation during the procedure. 4. Obtain a limited echo tomorrow to reassess LVEF following complex PCI leading to temporary closure of the LAD and LCx. 5. Aggressive secondary prevention, including high intensity statin therapy and smoking cessation. Yvonne Kendall, MD [facility]   ECHOCARDIOGRAM COMPLETE  Result Date: 07/03/2019    ECHOCARDIOGRAM REPORT   Patient Name:   Grace Castillo Date of Exam: 07/03/2019 Medical Rec #:  [id number]    Height:       62.5 in Accession #:    [id number]   Weight:       164.0 lb Date of Birth:  [birth year]    BSA:          1.768 m  Patient Age:    38 years     BP:           146/84 mmHg Patient Gender: F            HR:           56 bpm. Exam Location:  Inpatient Procedure: 2D Echo, Color Doppler and Cardiac Doppler STAT ECHO Indications:    I47.2 Ventricular tachycardia  History:        Patient has no prior history of Echocardiogram examinations.                 Risk Factors:Hypertension.  Sonographer:  [facility] Senior RDCS Referring Phys: [id number] ANGELA NICOLE DUKE IMPRESSIONS  1. Left ventricular ejection fraction, by estimation, is 50 to 55%. The left ventricle has low normal function. The left ventricle has no regional wall motion abnormalities. Left ventricular diastolic parameters were normal.  2. Right ventricular systolic function is normal. The right ventricular size is normal. Tricuspid regurgitation signal is inadequate for assessing PA pressure.  3. The mitral valve is normal in structure. Trivial mitral valve regurgitation. No evidence of mitral stenosis.  4. The aortic valve has an indeterminant number of cusps. Aortic valve regurgitation is not visualized. No aortic stenosis is present.  5. The inferior vena cava is normal in size with greater than 50% respiratory variability, suggesting right atrial pressure of 3 mmHg. Comparison(s): No prior Echocardiogram. Conclusion(s)/Recommendation(s): Low normal EF. There are no clear wall motion abnormalities that are consistent across views, but the inferior/inferoseptal/inferolateral walls appear to thicken less than the other walls. FINDINGS  Left Ventricle: Left ventricular ejection fraction, by estimation, is 50 to 55%. The left ventricle has low normal function. The left ventricle has no regional wall motion abnormalities. The left ventricular internal cavity size was normal in size. There is no left ventricular hypertrophy. Left ventricular diastolic parameters were normal. Right Ventricle: The right ventricular size is normal. No increase in right ventricular wall thickness. Right  ventricular systolic function is normal. Tricuspid regurgitation signal is inadequate for assessing PA pressure. Left Atrium: Left atrial size was normal in size. Right Atrium: Right atrial size was normal in size. Pericardium: There is no evidence of pericardial effusion. Mitral Valve: The mitral valve is normal in structure. Trivial mitral valve regurgitation. No evidence of mitral valve stenosis. Tricuspid Valve: The tricuspid valve is normal in structure. Tricuspid valve regurgitation is trivial. No evidence of tricuspid stenosis. Aortic Valve: The aortic valve has an indeterminant number of cusps. Aortic valve regurgitation is not visualized. No aortic stenosis is present. Pulmonic Valve: The pulmonic valve was not well visualized. Pulmonic valve regurgitation is not visualized. No evidence of pulmonic stenosis. Aorta: The aortic root, ascending aorta and aortic arch are all structurally normal, with no evidence of dilitation or obstruction. Venous: The inferior vena cava is normal in size with greater than 50% respiratory variability, suggesting right atrial pressure of 3 mmHg. IAS/Shunts: No atrial level shunt detected by color flow Doppler.  LEFT VENTRICLE PLAX 2D LVIDd:         4.60 cm  Diastology LVIDs:         3.40 cm  LV e' lateral:   9.25 cm/s LV PW:         0.90 cm  LV E/e' lateral: 10.1 LV IVS:        0.70 cm  LV e' medial:    8.05 cm/s LVOT diam:     2.00 cm  LV E/e' medial:  11.7 LV SV:         53 LV SV Index:   30 LVOT Area:     3.14 cm  RIGHT VENTRICLE RV S prime:     11.00 cm/s TAPSE (M-mode): 1.8 cm LEFT ATRIUM             Index       RIGHT ATRIUM           Index LA diam:        3.10 cm 1.75 cm/m  RA Area:     10.90 cm LA Vol (A2C):   39.7 ml 22.46 ml/m RA Volume:  22.80 ml  12.90 ml/m LA Vol (A4C):   25.7 ml 14.54 ml/m LA Biplane Vol: 33.5 ml 18.95 ml/m  AORTIC VALVE LVOT Vmax:   75.70 cm/s LVOT Vmean:  49.700 cm/s LVOT VTI:    0.168 m  AORTA Ao Root diam: 2.50 cm Ao Asc diam:  2.50 cm  MITRAL VALVE MV Area (PHT): 3.77 cm    SHUNTS MV Decel Time: 201 msec    Systemic VTI:  0.17 m MV E velocity: 93.80 cm/s  Systemic Diam: 2.00 cm MV A velocity: 52.70 cm/s MV E/A ratio:  1.78 Jodelle Red MD Electronically signed by Jodelle Red MD Signature Date/Time: 07/03/2019/2:54:48 PM    Final    ECHOCARDIOGRAM LIMITED  Result Date: 07/04/2019    ECHOCARDIOGRAM LIMITED REPORT   Patient Name:   Grace Castillo Date of Exam: 07/04/2019 Medical Rec #:  [id number]    Height:       62.5 in Accession #:    [id number]   Weight:       163.6 lb Date of Birth:  November 13, 1980    BSA:          1.766 m Patient Age:    38 years     BP:           134/80 mmHg Patient Gender: F            HR:           65 bpm. Exam Location:  Inpatient Procedure: Limited Echo, Cardiac Doppler and Color Doppler Indications:    Acute Cornonary Syndrome I24.9  History:        Patient has prior history of Echocardiogram examinations, most                 recent 07/03/2019. Ventricular tachycardia.  Sonographer:    Leeroy Bock Turrentine Referring Phys: [id number] CHRISTOPHER END IMPRESSIONS  1. Left ventricular ejection fraction, by estimation, is 55 to 60%. The left ventricle has normal function. The left ventricle demonstrates regional wall motion abnormalities (see scoring diagram/findings for description). There is mild hypokinesis of the left ventricular, basal-mid inferolateral wall.  2. The mitral valve is grossly normal. Trivial mitral valve regurgitation.  3. The aortic valve is tricuspid. Aortic valve regurgitation is not visualized.  4. The inferior vena cava is dilated in size with <50% respiratory variability, suggesting right atrial pressure of 15 mmHg. FINDINGS  Left Ventricle: Left ventricular ejection fraction, by estimation, is 55 to 60%. The left ventricle has normal function. The left ventricle demonstrates regional wall motion abnormalities. Mild hypokinesis of the left ventricular, basal-mid inferolateral wall. The left  ventricular internal cavity size was normal in size. There is no left ventricular hypertrophy. Mitral Valve: The mitral valve is grossly normal. Trivial mitral valve regurgitation. Aortic Valve: The aortic valve is tricuspid. Aortic valve regurgitation is not visualized. Venous: The inferior vena cava is dilated in size with less than 50% respiratory variability, suggesting right atrial pressure of 15 mmHg.  LEFT VENTRICLE PLAX 2D LVIDd:         5.02 cm LVIDs:         3.15 cm LV PW:         0.83 cm LV IVS:        0.83 cm  LV Volumes (MOD) LV vol d, MOD A2C: 75.0 ml LV vol d, MOD A4C: 106.0 ml LV vol s, MOD A2C: 35.9 ml LV vol s, MOD A4C: 46.6 ml LV SV MOD A2C:     39.1 ml  LV SV MOD A4C:     106.0 ml LV SV MOD BP:      53.3 ml LEFT ATRIUM         Index LA diam:    3.10 cm 1.76 cm/m   AORTA Ao Root diam: 2.40 cm MITRAL VALVE MV Area (PHT): 3.68 cm MV Decel Time: 206 msec MV E velocity: 104.00 cm/s MV A velocity: 67.70 cm/s MV E/A ratio:  1.54 Zoila Shutter MD Electronically signed by Zoila Shutter MD Signature Date/Time: 07/04/2019/10:02:05 AM    Final    Disposition   Pt is being discharged home today in good condition.  Follow-up Plans & Appointments    Follow-up Information    Leone Brand, NP Follow up on 07/13/2019.   Specialties: Cardiology, Radiology Why: at 9:15am for your follow up appt. Contact information: [address] [place] [address] [phone]          Discharge Instructions    Amb Referral to Cardiac Rehabilitation   Complete by: As directed    To [facility] or [facility]   Diagnosis:  Coronary Stents PTCA NSTEMI     After initial evaluation and assessments completed: Virtual Based Care may be provided alone or in conjunction with Phase 2 Cardiac Rehab based on patient barriers.: Yes   Call MD for:  redness, tenderness, or signs of infection (pain, swelling, redness, odor or green/yellow discharge around incision site)   Complete by: As directed    Diet -  low sodium heart healthy   Complete by: As directed    Discharge instructions   Complete by: As directed    PLEASE DO NOT MISS ANY DOSES OF YOUR BRILINTA!!!!! Also keep a log of you blood pressures and bring back to your follow up appt. Please call the office with any questions.   Patients taking blood thinners should generally stay away from medicines like ibuprofen, Advil, Motrin, naproxen, and Aleve due to risk of stomach bleeding. You may take Tylenol as directed or talk to your primary doctor about alternatives.   Increase activity slowly   Complete by: As directed       Discharge Medications   Allergies as of 07/05/2019      Reactions   Pork-derived Products Anaphylaxis   Sulfa Antibiotics Nausea And Vomiting      Medication List    STOP taking these medications   naproxen sodium 220 MG tablet Commonly known as: ALEVE     TAKE these medications   aspirin 81 MG EC tablet Take 1 tablet (81 mg total) by mouth daily. Start taking on: Jul 06, 2019   atorvastatin 80 MG tablet Commonly known as: LIPITOR Take 1 tablet (80 mg total) by mouth daily. Start taking on: Jul 06, 2019   metoprolol tartrate 25 MG tablet Commonly known as: LOPRESSOR Take 0.5 tablets (12.5 mg total) by mouth 2 (two) times daily.   nitroGLYCERIN 0.4 MG SL tablet Commonly known as: NITROSTAT Place 1 tablet (0.4 mg total) under the tongue every 5 (five) minutes x 3 doses as needed for chest pain.   ticagrelor 90 MG Tabs tablet Commonly known as: BRILINTA Take 1 tablet (90 mg total) by mouth 2 (two) times daily.        Aspirin prescribed at discharge?  Yes High Intensity Statin Prescribed? (Lipitor 40-80mg  or Crestor 20-40mg ): Yes Beta Blocker Prescribed? Yes For EF <40%, was ACEI/ARB Prescribed? No: N/A ADP Receptor Inhibitor Prescribed? (i.e. Plavix etc.-Includes Medically Managed Patients): Yes For  EF <40%, Aldosterone Inhibitor Prescribed? No: N/A Was EF assessed during THIS  hospitalization? Yes Was Cardiac Rehab II ordered? (Included Medically managed Patients): Yes   Outstanding Labs/Studies   FLP/LFTs in 8 weeks  Duration of Discharge Encounter   Greater than 30 minutes including physician time.  Signed, Laverda Page NP-C 07/05/2019, 10:37 AM   Personally seen and examined. Agree with above.   Subjective  Feels better. Overall had a reasonable night. No chest pain no fevers chills nausea vomiting syncope bleeding.  Inpatient Medications  Scheduled Meds:  . aspirin EC 81 mg Oral Daily  . atorvastatin 80 mg Oral Daily  . Chlorhexidine Gluconate Cloth 6 each Topical Daily  . metoprolol tartrate 12.5 mg Oral BID  . sodium chloride flush 3 mL Intravenous Q12H  . ticagrelor 90 mg Oral BID   Continuous Infusions:  . sodium chloride   . nitroGLYCERIN Stopped (07/03/19 2137)   PRN Meds:  sodium chloride, acetaminophen, nitroGLYCERIN, ondansetron (ZOFRAN) IV, sodium chloride flush  Vital Signs         Vitals:   07/04/19 2330 07/05/19 0006 07/05/19 0403 07/05/19 0500  BP:  (!) 93/52 103/66   Pulse:  71 79   Resp:  16 15   Temp: 98.5 F (36.9 C)  98.4 F (36.9 C)   TempSrc: Oral  Oral   SpO2:  98% 100%   Weight:    72.8 kg  Height:        Intake/Output Summary (Last 24 hours) at 07/05/2019 0844  Last data filed at 07/05/2019 0000     Gross per 24 hour  Intake 323.32 ml  Output --  Net 323.32 ml   Last 3 Weights 07/05/2019 07/04/2019 07/03/2019  Weight (lbs) 160 lb 7.9 oz 163 lb 9.3 oz 163 lb 2.3 oz  Weight (kg) 72.8 kg 74.2 kg 74 kg   Telemetry  No adverse arrhythmias no further VT normal sinus rhythm- Personally Reviewed  ECG  T wave inversions more notable in the inferolateral leads., Sinus rhythm- Personally Reviewed  Physical Exam  GEN: No acute distress.  Neck: No JVD  Cardiac: RRR, no murmurs, rubs, or gallops.  Respiratory: Clear to auscultation bilaterally.  GI: Soft, nontender, non-distended  MS: No edema; No  deformity.  Neuro: Nonfocal  Psych: Normal affect  Labs  High Sensitivity Troponin:  Last Labs        Recent Labs  Lab 07/03/19  0853 07/03/19  1916 07/03/19  2208  TROPONINIHS 39* 6,820* 6,633*  Chemistry  Last Labs        Recent Labs  Lab 07/03/19  0853 07/03/19  1916 07/04/19  0537  NA 139 --  136  K 4.2 --  3.4*  CL 107 --  108  CO2 22 --  18*  GLUCOSE 149* --  118*  BUN 14 --  6  CREATININE 0.64 0.63 0.57  CALCIUM 9.4 --  7.7*  GFRNONAA >60 >60 >60  GFRAA >60 >60 >60  ANIONGAP 10 --  10  Hematology  Last Labs        Recent Labs  Lab 07/03/19  0853 07/03/19  1916 07/04/19  0537  WBC 17.1* 15.1* 11.4*  RBC 4.55 3.95 3.60*  HGB 15.0 13.0 11.9*  HCT 44.3 38.6 35.2*  MCV 97.4 97.7 97.8  MCH 33.0 32.9 33.1  MCHC 33.9 33.7 33.8  RDW 13.5 13.5 13.6  PLT 235 210 182  BNP  Last Labs   No results for input(s): BNP, PROBNP in  the last 168 hours.  DDimer  Last Labs   No results for input(s): DDIMER in the last 168 hours.   Radiology   Imaging Results (Last 48 hours)     Cardiac Studies  Echo-see below  Patient Profile  39 y.o. female non-STEMI  Assessment & Plan  Non-STEMI  -- circumflex tubular 90% lesion originally--> Circ stent, LAD stent into LM. Crush balloon. Thrombus burden --> 2b3a 18 hours. Trop 6000.  - ASA Brilinta 6-12 months, statin (LDL 121)  - EF 50-55% inferior/inferoseptal/inferolateral walls even post catheterization EF appears fairly normal.  -Cardiac rehab  Nonsustained ventricular tachycardia  -Also noted at [facility], increased her symptoms. Was transiently on amiodarone drip overnight post catheterization. No further recurrence. Low-dose beta-blocker. Off of amiodarone.  Multiple sclerosis  - R eye partial blindness originally. Has been off of meds for >3 years  Tobacco use  -Continue to counsel on cessation. Discussed with her yesterday as well as her sister on the telephone.  I think is reasonable for her after  working with cardiac rehab to be discharged today. Her ECG does show T wave inversions some evolution in the lateral leads. She is not having any further discomfort.  Her son is turning 52 today here.  She has another son that is in [place] that is graduating in [place] on the 25th. I expressed my viewpoints on her flying currently, I would like for her to hold off on any air travel at least for the next 2 weeks. It is not ideal for her to travel at this time. She understands this. If she travels by highway, she states that she could have a driver take her and that she would make frequent stops. She does understand the risks.  For questions or updates, please contact [place]  Please consult [email or web address] for contact info under  Signed,  Candee Furbish, MD

## 2019-07-06 ENCOUNTER — Telehealth (HOSPITAL_COMMUNITY): Payer: Self-pay

## 2019-07-06 NOTE — Telephone Encounter (Signed)
Faxed referral to [facility] and [facility] for Cardiac Rehab.

## 2019-07-12 NOTE — Progress Notes (Signed)
Cardiology Office Note   Date:  07/13/2019   ID:  Grace Castillo, DOB Sep 10, 1980, MRN [id number]  PCP:  Patient, No Pcp Per  Cardiologist:  Dr. Anne Fu    Chief Complaint  Patient presents with  . Hospitalization Follow-up    NSTEMI       History of Present Illness: Grace Castillo is a 39 y.o. female who presents for post hospital for NSTEMI.  She has hx of untreated MS, HTN and tobacco use presented to ER 07/03/19 with chest pain, pressure and sharp apin and SOB.  Wend to [facility] and was in [place] on tele and converted with IV amiodarone.  She has been having chest pain for about 2 hours.  + FH of premature CAD in her maternal GF at 46.  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 She was continued on IV amiodarone and planned to undergo cardiac cath.  Cath with acute plaque rupture with thrombus on the pLcx with 90% stenosis and diffuse pLAD disease. Complex IVUS PCI to the osital/pLcx which lead to significant plaque shift and thrombus formation in the ostial LAD requiring bifurcation stenting of the distal LM into the LAD and LCx. There was also embolization of occlusive thrombus into the mLAD which was treated with balloon angioplasty. Trop peaked at 6000. On DAPT - ASA brilinta   NSVT runs resolved  with IV amiodarone.  On BB.  On high dose statin, + tobacco use,  MS  Her ECG does show T wave inversions some evolution in the lateral leads. She is not having any further discomfort.  Hepatic and lipids in 7 weeks   Today she did not go to [place] for son's graduation, no one would drive her.  She has no chest pain but some arm pain.  Also decreased appetite. No SOB unless outside in the heat.  seh is eating healthy, and has decreased tobacco to 6 cigarettes per day from 1PPD   - she will continue to decrease.  We discussed meds.  She is  worried about wt gain.  With Wellbutrin she should not have the wt gain.   She is also tearful, discussed this is big life event at any age.  Wellbutrin may help.     Has some rapid heart beats will check K+ that was low prior to discharge and Mg+ level.  No bleeding with ASA and Brilinta.  Past Medical History:  Diagnosis Date  . Hyperlipidemia   . Hypertension   . MS (multiple sclerosis) (HCC)   . NSTEMI (non-ST elevated myocardial infarction) [facility])     Past Surgical History:  Procedure Laterality Date  . CHOLECYSTECTOMY    . CORONARY BALLOON ANGIOPLASTY N/A 07/03/2019   Procedure: CORONARY BALLOON ANGIOPLASTY;  Surgeon: Yvonne Kendall, MD;  Location: [facility] INVASIVE CV LAB;  Service: Cardiovascular;  Laterality: N/A;  distal lad  . CORONARY STENT INTERVENTION N/A 07/03/2019   Procedure: CORONARY STENT INTERVENTION;  Surgeon: Yvonne Kendall, MD;  Location: [facility] INVASIVE CV LAB;  Service: Cardiovascular;  Laterality: N/A;  cfx lad   . INTRAVASCULAR ULTRASOUND/IVUS N/A 07/03/2019   Procedure: Intravascular Ultrasound/IVUS;  Surgeon: Yvonne Kendall, MD;  Location: [facility] INVASIVE CV LAB;  Service: Cardiovascular;  Laterality: N/A;  . LEFT HEART CATH AND CORONARY ANGIOGRAPHY N/A 07/03/2019   Procedure: LEFT HEART CATH  AND CORONARY ANGIOGRAPHY;  Surgeon: Yvonne Kendall, MD;  Location: [facility] INVASIVE CV LAB;  Service: Cardiovascular;  Laterality: N/A;  . TUBAL LIGATION       Current Outpatient Medications  Medication Sig Dispense Refill  . aspirin EC 81 MG EC tablet Take 1 tablet (81 mg total) by mouth daily. 90 tablet 0  . atorvastatin (LIPITOR) 80 MG tablet Take 1 tablet (80 mg total) by mouth daily. 90 tablet 1  . metoprolol tartrate (LOPRESSOR) 25 MG tablet Take 0.5 tablets (12.5 mg total) by mouth 2 (two) times daily. 90 tablet 0  . nitroGLYCERIN (NITROSTAT) 0.4 MG SL tablet Place 1 tablet (0.4 mg total) under the tongue every 5 (five) minutes x 3 doses as needed for chest pain. 25 tablet 2   . ticagrelor (BRILINTA) 90 MG TABS tablet Take 1 tablet (90 mg total) by mouth 2 (two) times daily. 180 tablet 2   No current facility-administered medications for this visit.    Allergies:   Pork-derived products and Sulfa antibiotics    Social History:  The patient  reports that she has been smoking cigarettes. She has been smoking about 1.00 pack per day. She has never used smokeless tobacco. She reports current drug use. Drug: Marijuana. She reports that she does not drink alcohol.   Family History:  The patient's family history includes Sudden Cardiac Death in her maternal grandfather.    ROS:  General:no colds or fevers, + weight loss Skin:no rashes or ulcers HEENT:no blurred vision, no congestion, blind in Rt eye CV:see HPI PUL:see HPI GI:no diarrhea constipation or melena, no indigestion, some nausea GU:no hematuria, no dysuria MS:no joint pain, no claudication, + multiple sclerosis Neuro:no syncope, no lightheadedness Endo:no diabetes, no thyroid disease  Wt Readings from Last 3 Encounters:  07/13/19 157 lb (71.2 kg)  07/05/19 160 lb 7.9 oz (72.8 kg)     PHYSICAL EXAM: VS:  BP 108/60   Pulse 83   Ht 5' 2.5" (1.588 m)   Wt 157 lb (71.2 kg)   LMP 06/13/2019   SpO2 99%   BMI 28.26 kg/m  , BMI Body mass index is 28.26 kg/m. General:Pleasant affect, NAD Skin:Warm and dry, brisk capillary refill HEENT:normocephalic, sclera clear, mucus membranes moist Neck:supple, no JVD, no bruits  Heart:S1S2 RRR without murmur, gallup, rub or click Lungs:clear without rales, rhonchi, or wheezes CZY:SAYT, non tender, + BS, do not palpate liver spleen or masses Ext:no lower ext edema, 2+ pedal pulses, 2+ radial pulses Neuro:alert and oriented X 3, MAE, follows commands, + facial symmetry    EKG:  EKG is ordered today. The ekg ordered today demonstrates SR improved T waves I, V4-6    Recent Labs: 07/03/2019: Magnesium 1.9; TSH 1.607 07/04/2019: BUN 6; Creatinine, Ser 0.57;  Hemoglobin 11.9; Platelets 182; Potassium 3.4; Sodium 136    Lipid Panel    Component Value Date/Time   CHOL 165 07/04/2019 0537   TRIG 83 07/04/2019 0537   HDL 27 (L) 07/04/2019 0537   CHOLHDL 6.1 07/04/2019 0537   VLDL 17 07/04/2019 0537   LDLCALC 121 (H) 07/04/2019 0537       Other studies Reviewed: Additional studies/ records that were reviewed today include: . Cath: 07/03/19  Conclusions: 1. Multivessel coronary artery disease, including mild to moderate diffuse proximal LAD disease, acute plaque rupture with thrombus involving the ostial/proximal LCx with 90% stenosis, and 40% tubular lesion in the proximal RCA. 2. Mildly elevated left ventricular filling pressure. 3. Complex IVUS guided PCI to the  ostial/proximal LCx leading to significant plaque shift and thrombus formation in the ostial LAD requiring bifurcation stenting of the distal LMCA into the LAD and LCx using a crush technique (Resolute Onyx 3.0 x 22 mm drug-eluting stent in the LMCA/LAD and Resolute Onyx 3.5 x 12 mm drug-eluting stent in the LCx). There was also embolization of occlusive thrombus into the mid LAD, successfully treated with balloon angioplasty.  Recommendations: 1. Continue bivalirudin infusion for 2 hours and tirofiban infusion for 18 hours. 2. Titrate nitroglycerin infusion for relief of chest pain. 3. Dual antiplatelet therapy with aspirin and ticagrelor for at least 12 months, ideally longer in light of concern for hypercoagulable state given significant intracoronary thrombus formation despite adequate anticoagulation during the procedure. 4. Obtain a limited echo tomorrow to reassess LVEF following complex PCI leading to temporary closure of the LAD and LCx. 5. Aggressive secondary prevention, including high intensity statin therapy and smoking cessation.  Nelva Bush, MD [facility]  Diagnostic Dominance: Right  Intervention   Echo: 07/03/19  IMPRESSIONS    1. Left  ventricular ejection fraction, by estimation, is 50 to 55%. The  left ventricle has low normal function. The left ventricle has no regional  wall motion abnormalities. Left ventricular diastolic parameters were  normal.  2. Right ventricular systolic function is normal. The right ventricular  size is normal. Tricuspid regurgitation signal is inadequate for assessing  PA pressure.  3. The mitral valve is normal in structure. Trivial mitral valve  regurgitation. No evidence of mitral stenosis.  4. The aortic valve has an indeterminant number of cusps. Aortic valve  regurgitation is not visualized. No aortic stenosis is present.  5. The inferior vena cava is normal in size with greater than 50%  respiratory variability, suggesting right atrial pressure of 3 mmHg.   Conclusion(s)/Recommendation(s): Low normal EF. There are no clear wall  motion abnormalities that are consistent across views, but the  inferior/inferoseptal/inferolateral walls appear to thicken less than the  other walls.  _____________  Madaline Brilliant for work, welburtin SR 150 mg once daily for 3 days then BID   Cardiac rehab, exercise, eating healthy neuro appt. Her 1 month.  PCP BMP and Mg+ level.   ASSESSMENT AND PLAN:  1.  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  Unable to increase BB now due to borderline BP.  EF normal.   2.  NSVT with initial eval in ER, no further episodes, will check BMP and Mg+ level. Her K+ was lower at discharge.   3.  HLD on statin at high dose, needs hepatic and lipids next visit. This may be cause of nausea will see how she does if continues may need to adjust  4.   Multiple sclerosis needs neurologist will refer.    5.  Depression, situational will give her names for PCP and add Wellbutrin SR for depression and tobacco use.  Also less chance of wt gain with this  6.  Tobacco use she is decreasing but  Wellbutrin may help as well.    Current medicines are reviewed with the patient today.  The patient Has no concerns regarding medicines.  The following changes have been made:  See above Labs/ tests ordered today include:see above  Disposition:   FU:  see above  Signed, Cecilie Kicks, NP  07/13/2019 9:24 AM    [place]  [address] [place], [place] Phone: [phone]; Fax: [phone]

## 2019-07-13 ENCOUNTER — Ambulatory Visit (INDEPENDENT_AMBULATORY_CARE_PROVIDER_SITE_OTHER): Payer: Medicaid Other | Admitting: Cardiology

## 2019-07-13 ENCOUNTER — Encounter: Payer: Self-pay | Admitting: Neurology

## 2019-07-13 ENCOUNTER — Encounter: Payer: Self-pay | Admitting: Cardiology

## 2019-07-13 ENCOUNTER — Other Ambulatory Visit: Payer: Self-pay

## 2019-07-13 VITALS — BP 108/60 | HR 83 | Ht 62.5 in | Wt 157.0 lb

## 2019-07-13 DIAGNOSIS — I214 Non-ST elevation (NSTEMI) myocardial infarction: Secondary | ICD-10-CM | POA: Diagnosis not present

## 2019-07-13 DIAGNOSIS — I2510 Atherosclerotic heart disease of native coronary artery without angina pectoris: Secondary | ICD-10-CM

## 2019-07-13 DIAGNOSIS — I472 Ventricular tachycardia: Secondary | ICD-10-CM

## 2019-07-13 DIAGNOSIS — E876 Hypokalemia: Secondary | ICD-10-CM | POA: Diagnosis not present

## 2019-07-13 DIAGNOSIS — G35 Multiple sclerosis: Secondary | ICD-10-CM

## 2019-07-13 DIAGNOSIS — E782 Mixed hyperlipidemia: Secondary | ICD-10-CM

## 2019-07-13 DIAGNOSIS — I4729 Other ventricular tachycardia: Secondary | ICD-10-CM

## 2019-07-13 DIAGNOSIS — Z720 Tobacco use: Secondary | ICD-10-CM

## 2019-07-13 MED ORDER — BUPROPION HCL ER (SR) 150 MG PO TB12
ORAL_TABLET | ORAL | 2 refills | Status: DC
Start: 2019-07-13 — End: 2019-08-17

## 2019-07-13 NOTE — Patient Instructions (Signed)
Medication Instructions:  Your physician has recommended you make the following change in your medication:  1.  START Wellbutrin SR 150 mg taking 1 tablet daily for 3 days then increasing it to 1 tablet by mouth twice a day   *If you need a refill on your cardiac medications before your next appointment, please call your pharmacy*   Lab Work: TODAY:  BMET & MAG  If you have labs (blood work) drawn today and your tests are completely normal, you will receive your results only by: Marland Kitchen MyChart Message (if you have MyChart) OR . A paper copy in the mail If you have any lab test that is abnormal or we need to change your treatment, we will call you to review the results.   Testing/Procedures: None ordered   You have been referred to: [facility].  They will call you with an appointment.    Follow-Up: At [facility], you and your health needs are our priority.  As part of our continuing mission to provide you with exceptional heart care, we have created designated Provider Care Teams.  These Care Teams include your primary Cardiologist (physician) and Advanced Practice Providers (APPs -  Physician Assistants and Nurse Practitioners) who all work together to provide you with the care you need, when you need it.  We recommend signing up for the patient portal called "MyChart".  Sign up information is provided on this After Visit Summary.  MyChart is used to connect with patients for Virtual Visits (Telemedicine).  Patients are able to view lab/test results, encounter notes, upcoming appointments, etc.  Non-urgent messages can be sent to your provider as well.   To learn more about what you can do with MyChart, go to [email or web address].    Your next appointment:   1 month(s)  The format for your next appointment:   In Person  Provider:   Donato Schultz, MD or Nada Boozer, NP   Other Instructions  You may return back to work.  You need to find a primary care physician.  You  may call [phone] and they will help you.

## 2019-08-02 ENCOUNTER — Ambulatory Visit: Payer: Medicaid Other | Admitting: Family Medicine

## 2019-08-10 NOTE — Progress Notes (Signed)
Patient ID: Grace Castillo, female   DOB: 15-Feb-1981, 39 y.o.   MRN: [id number]      Grace Castillo, is a 39 y.o. female  MBP:[id number]  XFQ:[id number]  DOB - 1980-10-06  Subjective:  Chief Complaint and HPI: Grace Castillo is a 39 y.o. female here today to establish care and for a follow up visit After hospitalization 5/17-5/29/2021 for NSTEMI.  She has long-standing MS.  Sees cardiology tomorrow.  No further CP.  C/o weight gain although based on numbers, she has lost from 163 in hospital to 158 today.  She is frustrated bc she says this was entered improperly.  Says she is crying a lot.  Doesn't think wellbutrin is helping-thinks it is causing her to gain weight.  Compliant with meds.  She was able to get all meds filled.still smoking.  C/o nasal congestion.  No fever.  No cough.  No discolored nasal discharge.  From discharge summary: Grace Castillo a 39 yo female with PMH of untreated MS, HTN and tobacco use who presented to [facility] with pressure and sharp chest pain in her substernal chest and shortness of breath.When she got out of the shower, chest heaviness worsened, associated with perfuse diaphoresis. Step-father made her go to the ER. [facility] with shooting pain to her left arm. She was also in [place] on telemetry but converted with IV amiodarone. CP started about 1 month ago and would persist for 2 hrs. Over the last month, she reported CP on exertion when walking up stairs, walking on flat ground, or carrying boxes.   She denied COVID infection and had not had the COVID vaccination. She smokes cigarettes, and marijuana 2 times per week. Denied alcohol use. She does not have a relationship with her biological parents. Maternal grandfather died of MI at age 60. No sudden collapse or syncope in her lifetime. No medications for MS. 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 She works as a Quarry manager. [place] and had not established with neurology and  had not restarted any of her home medications. She has had 22 kidney stones requiring stents and lithotripsy.  EKG did show ST depression V4/5/6.  She was transferred from [facility] to [facility] for further management. Seen by Dr. Anne Fu on arrival. Had a bedside echo done that showed an EF of 50-55% with mild inferolateral hypokinesis. She was continued on IV amiodarone and planned to undergo cardiac cath.  1. Non-STEMI: Underwent cardiac cath noted above with multivessel disease and acute plaque rupture with thrombus on the pLcx with 90% stenosis and diffuse pLAD disease. Complex IVUS PCI to the osital/pLcx which lead to significant plaque shift and thrombus formation in the ostial LAD requiring bifurcation stenting of the distal LM into the LAD and LCx. There was also embolization of occlusive thrombus into the mLAD which was treated with balloon angioplasty. Trop peaked at 6000.  -- on DAPT with ASA Brilinta 6-12 months, along with statin (LDL 121) -- EF 50-55% inferior/inferoseptal/inferolateral wallson admission. Post catheterization EF appearred fairly normal. - worked well with Cardiac rehab  2. Nonsustained ventricular tachycardia: - Noted while at [facility], increased her symptoms. Was transiently on amiodarone drip overnight post catheterization. No further recurrence afterwards. Tolerated addition of low-dose beta-blocker.   3. Multiple sclerosis: -R eye partial blindness originally. Has been off of meds for >3 years.   4. Tobacco use: -Continue to counsel on cessation.   5. HL: --  LDL noted at 121 and placed on high dose statin  6. HTN:  -- blood pressures were actually soft but tolerated the addition of low dose BB therapy   From cardiology note 07/13/2019: Ok for work, welburtin SR 150 mg once daily for 3 days then BID   Cardiac rehab, exercise, eating healthy neuro appt. Her 1 month.  PCP BMP and Mg+ level.   ASSESSMENT AND PLAN:  1.  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  Unable to increase BB now due to borderline BP.  EF normal.   2.  NSVT with initial eval in ER, no further episodes, will check BMP and Mg+ level. Her K+ was lower at discharge.   3.  HLD on statin at high dose, needs hepatic and lipids next visit. This may be cause of nausea will see how she does if continues may need to adjust  4.   Multiple sclerosis needs neurologist will refer.    5.  Depression, situational will give her names for PCP and add Wellbutrin SR for depression and tobacco use.  Also less chance of wt gain with this  6.  Tobacco use she is decreasing but Wellbutrin may help as well.     ED/Hospital notes reviewed.   Social:  Works 11 hour days; 4 kids  ROS:   Constitutional:  No f/c, No night sweats, No unexplained weight loss. EENT:   No hearing changes. No mouth, throat, or ear problems.  Respiratory: No cough, No SOB Cardiac: No CP, no palpitations GI:  No abd pain, No N/V/D. GU: No Urinary s/sx Musculoskeletal: No joint pain Neuro: No headache, no dizziness, no motor weakness.  Skin: No rash Endocrine:  No polydipsia. No polyuria.  Psych: Denies SI/HI  No problems updated.  ALLERGIES: Allergies  Allergen Reactions   Pork-Derived Products Anaphylaxis   Sulfa Antibiotics Nausea And Vomiting    PAST MEDICAL HISTORY: Past Medical History:  Diagnosis Date   Hyperlipidemia    Hypertension    MS (multiple sclerosis) (HCC)    NSTEMI (non-ST elevated myocardial infarction) ([place])    NSVT (nonsustained ventricular tachycardia) ([place]) 07/02/2019   S/P angioplasty with stent    DES to LAD and Lcx     MEDICATIONS AT HOME: Prior to Admission medications   Medication Sig Start Date End Date Taking? Authorizing Provider  aspirin EC 81 MG EC tablet Take 1 tablet (81 mg total) by mouth daily. 07/06/19  Yes Cheryln Manly, NP    atorvastatin (LIPITOR) 80 MG tablet Take 1 tablet (80 mg total) by mouth daily. 07/06/19  Yes Cheryln Manly, NP  buPROPion [facility] SR) 150 MG 12 hr tablet Take 1 tablet by mouth daily for 3 days then increase it to 1 tablet by mouth twice a day 07/13/19  Yes Isaiah Serge, NP  metoprolol tartrate (LOPRESSOR) 25 MG tablet Take 0.5 tablets (12.5 mg total) by mouth 2 (two) times daily. 07/05/19  Yes Cheryln Manly, NP  nitroGLYCERIN (NITROSTAT) 0.4 MG SL tablet Place 1 tablet (0.4 mg total) under the tongue every 5 (five) minutes x 3 doses as needed for chest pain. 07/05/19  Yes Cheryln Manly, NP  ticagrelor (BRILINTA) 90 MG TABS tablet Take 1 tablet (90 mg total) by mouth 2 (two) times daily. 07/05/19  Yes Cheryln Manly, NP  fluticasone (FLONASE) 50 MCG/ACT nasal spray  Place 2 sprays into both nostrils daily. 08/16/19   Anders Simmonds, PA-C  sertraline (ZOLOFT) 50 MG tablet Take 1/2 tab daily for 1 week then take 1 tab daily 08/16/19   Anders Simmonds, PA-C     Objective:  EXAM:   Vitals:   08/16/19 1056  BP: 112/75  Pulse: 76  Resp: 16  Temp: 98.1 F (36.7 C)  SpO2: 98%  Weight: 158 lb (71.7 kg)  Height: 5' 2.5" (1.588 m)    General appearance : A&OX3. NAD. Non-toxic-appearing HEENT: Atraumatic and Normocephalic.  PERRLA. EOM intact.  Tearful.   Chest/Lungs:  Breathing-non-labored, Good air entry bilaterally, breath sounds normal without rales, rhonchi, or wheezing  CVS: S1 S2 regular, no murmurs, gallops, rubs  Extremities: Bilateral Lower Ext shows no edema, both legs are warm to touch with = pulse throughout Neurology:  CN II-XII grossly intact, Non focal.   Psych:  TP linear. J/I fair. Normal speech. Appropriate eye contact and depressed affect.  Skin:  No Rash  Data Review Lab Results  Component Value Date   HGBA1C 5.2 07/03/2019     Assessment & Plan   1. Depression, unspecified depression type Secondary to life event/NSTEMI although she says  she may have been depressed before.   - sertraline (ZOLOFT) 50 MG tablet; Take 1/2 tab daily for 1 week then take 1 tab daily  Dispense: 30 tablet; Refill: 3 - Vitamin D, 25-hydroxy - Ambulatory referral to Social Work  2. Weight gain Proper diet and exercise  - TSH - Vitamin D, 25-hydroxy - Ambulatory referral to Social Work  3. NSTEMI (non-ST elevated myocardial infarction) (HCC) Call 911 if any CP.  Keep f/up cardiology - Magnesium - Basic metabolic panel  4. Nasal congestion - fluticasone (FLONASE) 50 MCG/ACT nasal spray; Place 2 sprays into both nostrils daily.  Dispense: 16 g; Refill: 6  5. Smoking Smoking and dangers of nicotine have been discussed at length. Long term health consequences of smoking reviewed in detail.  Methods for helping with cessation have been reviewed.  Patient expresses understanding.   Patient have been counseled extensively about nutrition and exercise  Return in about 1 month (around 09/15/2019) for assign PCP;  recheck depression NSTEMI.  The patient was given clear instructions to go to ER or return to medical center if symptoms don't improve, worsen or new problems develop. The patient verbalized understanding. The patient was told to call to get lab results if they haven't heard anything in the next week.     Georgian Co, PA-C [facility] [place], [place] [phone]   08/16/2019, 12:01 PM

## 2019-08-11 ENCOUNTER — Telehealth: Payer: Self-pay | Admitting: Cardiology

## 2019-08-11 NOTE — Telephone Encounter (Signed)
Grace Castillo from [facility] in [facility] called. The patient will be doing her cardiac rehab at their facility and she will need a formal referral with Dr. Judd Gaudier signature faxed over to her . Her fax # is: [phone]

## 2019-08-14 NOTE — Telephone Encounter (Signed)
Called cardiac rehab at [facility] - they will email the referral form to [place] to be signed. Will fax it back to [phone].

## 2019-08-14 NOTE — Telephone Encounter (Signed)
APPs not allowed to sign, so up to Dr. Anne Fu

## 2019-08-15 NOTE — Telephone Encounter (Signed)
Never received email from [place] with referral form.  Called and left message with email again and requested they re-send.

## 2019-08-16 ENCOUNTER — Other Ambulatory Visit: Payer: Self-pay

## 2019-08-16 ENCOUNTER — Ambulatory Visit: Payer: Medicaid Other | Attending: Family Medicine | Admitting: Physician Assistant

## 2019-08-16 ENCOUNTER — Encounter: Payer: Self-pay | Admitting: Physician Assistant

## 2019-08-16 VITALS — BP 112/75 | HR 76 | Temp 98.1°F | Resp 16 | Ht 62.5 in | Wt 158.0 lb

## 2019-08-16 DIAGNOSIS — F17210 Nicotine dependence, cigarettes, uncomplicated: Secondary | ICD-10-CM | POA: Diagnosis not present

## 2019-08-16 DIAGNOSIS — Z87442 Personal history of urinary calculi: Secondary | ICD-10-CM | POA: Insufficient documentation

## 2019-08-16 DIAGNOSIS — I214 Non-ST elevation (NSTEMI) myocardial infarction: Secondary | ICD-10-CM | POA: Diagnosis present

## 2019-08-16 DIAGNOSIS — Z955 Presence of coronary angioplasty implant and graft: Secondary | ICD-10-CM | POA: Diagnosis not present

## 2019-08-16 DIAGNOSIS — F329 Major depressive disorder, single episode, unspecified: Secondary | ICD-10-CM

## 2019-08-16 DIAGNOSIS — F4321 Adjustment disorder with depressed mood: Secondary | ICD-10-CM | POA: Insufficient documentation

## 2019-08-16 DIAGNOSIS — Z79899 Other long term (current) drug therapy: Secondary | ICD-10-CM | POA: Diagnosis not present

## 2019-08-16 DIAGNOSIS — Z7902 Long term (current) use of antithrombotics/antiplatelets: Secondary | ICD-10-CM | POA: Diagnosis not present

## 2019-08-16 DIAGNOSIS — Z882 Allergy status to sulfonamides status: Secondary | ICD-10-CM | POA: Insufficient documentation

## 2019-08-16 DIAGNOSIS — G35 Multiple sclerosis: Secondary | ICD-10-CM | POA: Insufficient documentation

## 2019-08-16 DIAGNOSIS — R0981 Nasal congestion: Secondary | ICD-10-CM | POA: Diagnosis not present

## 2019-08-16 DIAGNOSIS — E785 Hyperlipidemia, unspecified: Secondary | ICD-10-CM | POA: Insufficient documentation

## 2019-08-16 DIAGNOSIS — Z7982 Long term (current) use of aspirin: Secondary | ICD-10-CM | POA: Insufficient documentation

## 2019-08-16 DIAGNOSIS — F32A Depression, unspecified: Secondary | ICD-10-CM

## 2019-08-16 DIAGNOSIS — R635 Abnormal weight gain: Secondary | ICD-10-CM | POA: Diagnosis not present

## 2019-08-16 DIAGNOSIS — Z8249 Family history of ischemic heart disease and other diseases of the circulatory system: Secondary | ICD-10-CM | POA: Diagnosis not present

## 2019-08-16 DIAGNOSIS — I10 Essential (primary) hypertension: Secondary | ICD-10-CM | POA: Insufficient documentation

## 2019-08-16 DIAGNOSIS — F17200 Nicotine dependence, unspecified, uncomplicated: Secondary | ICD-10-CM

## 2019-08-16 MED ORDER — SERTRALINE HCL 50 MG PO TABS: ORAL_TABLET | ORAL | 3 refills | Status: DC

## 2019-08-16 MED ORDER — FLUTICASONE PROPIONATE 50 MCG/ACT NA SUSP: 2.0000 | Freq: Every day | NASAL | 6 refills | Status: DC

## 2019-08-16 NOTE — Patient Instructions (Signed)
Coping with Quitting Smoking  Quitting smoking is a physical and mental challenge. You will face cravings, withdrawal symptoms, and temptation. Before quitting, work with your health care provider to make a plan that can help you cope. Preparation can help you quit and keep you from giving in. How can I cope with cravings? Cravings usually last for 5-10 minutes. If you get through it, the craving will pass. Consider taking the following actions to help you cope with cravings:  Keep your mouth busy: ? Chew sugar-free gum. ? Suck on hard candies or a straw. ? Brush your teeth.  Keep your hands and body busy: ? Immediately change to a different activity when you feel a craving. ? Squeeze or play with a ball. ? Do an activity or a hobby, like making bead jewelry, practicing needlepoint, or working with wood. ? Mix up your normal routine. ? Take a short exercise break. Go for a quick walk or run up and down stairs. ? Spend time in public places where smoking is not allowed.  Focus on doing something kind or helpful for someone else.  Call a friend or family member to talk during a craving.  Join a support group.  Call a quit line, such as 1-800-QUIT-NOW.  Talk with your health care provider about medicines that might help you cope with cravings and make quitting easier for you. How can I deal with withdrawal symptoms? Your body may experience negative effects as it tries to get used to not having nicotine in the system. These effects are called withdrawal symptoms. They may include:  Feeling hungrier than normal.  Trouble concentrating.  Irritability.  Trouble sleeping.  Feeling depressed.  Restlessness and agitation.  Craving a cigarette. To manage withdrawal symptoms:  Avoid places, people, and activities that trigger your cravings.  Remember why you want to quit.  Get plenty of sleep.  Avoid coffee and other caffeinated drinks. These may worsen some of your  symptoms. How can I handle social situations? Social situations can be difficult when you are quitting smoking, especially in the first few weeks. To manage this, you can:  Avoid parties, bars, and other social situations where people might be smoking.  Avoid alcohol.  Leave right away if you have the urge to smoke.  Explain to your family and friends that you are quitting smoking. Ask for understanding and support.  Plan activities with friends or family where smoking is not an option. What are some ways I can cope with stress? Wanting to smoke may cause stress, and stress can make you want to smoke. Find ways to manage your stress. Relaxation techniques can help. For example:  Breathe slowly and deeply, in through your nose and out through your mouth.  Listen to soothing, relaxing music.  Talk with a family member or friend about your stress.  Light a candle.  Soak in a bath or take a shower.  Think about a peaceful place. What are some ways I can prevent weight gain? Be aware that many people gain weight after they quit smoking. However, not everyone does. To keep from gaining weight, have a plan in place before you quit and stick to the plan after you quit. Your plan should include:  Having healthy snacks. When you have a craving, it may help to: ? Eat plain popcorn, crunchy carrots, celery, or other cut vegetables. ? Chew sugar-free gum.  Changing how you eat: ? Eat small portion sizes at meals. ? Eat 4-6 small meals   throughout the day instead of 1-2 large meals a day. ? Be mindful when you eat. Do not watch television or do other things that might distract you as you eat.  Exercising regularly: ? Make time to exercise each day. If you do not have time for a long workout, do short bouts of exercise for 5-10 minutes several times a day. ? Do some form of strengthening exercise, like weight lifting, and some form of aerobic exercise, like running or swimming.  Drinking  plenty of water or other low-calorie or no-calorie drinks. Drink 6-8 glasses of water daily, or as much as instructed by your health care provider. Summary  Quitting smoking is a physical and mental challenge. You will face cravings, withdrawal symptoms, and temptation to smoke again. Preparation can help you as you go through these challenges.  You can cope with cravings by keeping your mouth busy (such as by chewing gum), keeping your body and hands busy, and making calls to family, friends, or a helpline for people who want to quit smoking.  You can cope with withdrawal symptoms by avoiding places where people smoke, avoiding drinks with caffeine, and getting plenty of rest.  Ask your health care provider about the different ways to prevent weight gain, avoid stress, and handle social situations. This information is not intended to replace advice given to you by your health care provider. Make sure you discuss any questions you have with your health care provider. Document Revised: 01/15/2017 Document Reviewed: 01/31/2016 Elsevier Patient Education  2020 [employer] Living With Depression Everyone experiences occasional disappointment, sadness, and loss in their lives. When you are feeling down, blue, or sad for at least 2 weeks in a row, it may mean that you have depression. Depression can affect your thoughts and feelings, relationships, daily activities, and physical health. It is caused by changes in the way your brain functions. If you receive a diagnosis of depression, your health care provider will tell you which type of depression you have and what treatment options are available to you. If you are living with depression, there are ways to help you recover from it and also ways to prevent it from coming back. How to cope with lifestyle changes Coping with stress     Stress is your body's reaction to life changes and events, both good and bad. Stressful situations may  include:  Getting married.  The death of a spouse.  Losing a job.  Retiring.  Having a baby. Stress can last just a few hours or it can be ongoing. Stress can play a major role in depression, so it is important to learn both how to cope with stress and how to think about it differently. Talk with your health care provider or a counselor if you would like to learn more about stress reduction. He or she may suggest some stress reduction techniques, such as:  Music therapy. This can include creating music or listening to music. Choose music that you enjoy and that inspires you.  Mindfulness-based meditation. This kind of meditation can be done while sitting or walking. It involves being aware of your normal breaths, rather than trying to control your breathing.  Centering prayer. This is a kind of meditation that involves focusing on a spiritual word or phrase. Choose a word, phrase, or sacred image that is meaningful to you and that brings you peace.  Deep breathing. To do this, expand your stomach and inhale slowly through your nose. Hold your breath for  3-5 seconds, then exhale slowly, allowing your stomach muscles to relax.  Muscle relaxation. This involves intentionally tensing muscles then relaxing them. Choose a stress reduction technique that fits your lifestyle and personality. Stress reduction techniques take time and practice to develop. Set aside 5-15 minutes a day to do them. Therapists can offer training in these techniques. The training may be covered by some insurance plans. Other things you can do to manage stress include:  Keeping a stress diary. This can help you learn what triggers your stress and ways to control your response.  Understanding what your limits are and saying no to requests or events that lead to a schedule that is too full.  Thinking about how you respond to certain situations. You may not be able to control everything, but you can control how you  react.  Adding humor to your life by watching funny films or TV shows.  Making time for activities that help you relax and not feeling guilty about spending your time this way.  Medicines Your health care provider may suggest certain medicines if he or she feels that they will help improve your condition. Avoid using alcohol and other substances that may prevent your medicines from working properly (may interact). It is also important to:  Talk with your pharmacist or health care provider about all the medicines that you take, their possible side effects, and what medicines are safe to take together.  Make it your goal to take part in all treatment decisions (shared decision-making). This includes giving input on the side effects of medicines. It is best if shared decision-making with your health care provider is part of your total treatment plan. If your health care provider prescribes a medicine, you may not notice the full benefits of it for 4-8 weeks. Most people who are treated for depression need to be on medicine for at least 6-12 months after they feel better. If you are taking medicines as part of your treatment, do not stop taking medicines without first talking to your health care provider. You may need to have the medicine slowly decreased (tapered) over time to decrease the risk of harmful side effects. Relationships Your health care provider may suggest family therapy along with individual therapy and drug therapy. While there may not be family problems that are causing you to feel depressed, it is still important to make sure your family learns as much as they can about your mental health. Having your family's support can help make your treatment successful. How to recognize changes in your condition Everyone has a different response to treatment for depression. Recovery from major depression happens when you have not had signs of major depression for two months. This may mean that you  will start to:  Have more interest in doing activities.  Feel less hopeless than you did 2 months ago.  Have more energy.  Overeat less often, or have better or improving appetite.  Have better concentration. Your health care provider will work with you to decide the next steps in your recovery. It is also important to recognize when your condition is getting worse. Watch for these signs:  Having fatigue or low energy.  Eating too much or too little.  Sleeping too much or too little.  Feeling restless, agitated, or hopeless.  Having trouble concentrating or making decisions.  Having unexplained physical complaints.  Feeling irritable, angry, or aggressive. Get help as soon as you or your family members notice these symptoms coming back. How  to get support and help from others How to talk with friends and family members about your condition  Talking to friends and family members about your condition can provide you with one way to get support and guidance. Reach out to trusted friends or family members, explain your symptoms to them, and let them know that you are working with a health care provider to treat your depression. Financial resources Not all insurance plans cover mental health care, so it is important to check with your insurance carrier. If paying for co-pays or counseling services is a problem, search for a local or county mental health care center. They may be able to offer public mental health care services at low or no cost when you are not able to see a private health care provider. If you are taking medicine for depression, you may be able to get the generic form, which may be less expensive. Some makers of prescription medicines also offer help to patients who cannot afford the medicines they need. Follow these instructions at home:   Get the right amount and quality of sleep.  Cut down on using caffeine, tobacco, alcohol, and other potentially harmful  substances.  Try to exercise, such as walking or lifting small weights.  Take over-the-counter and prescription medicines only as told by your health care provider.  Eat a healthy diet that includes plenty of vegetables, fruits, whole grains, low-fat dairy products, and lean protein. Do not eat a lot of foods that are high in solid fats, added sugars, or salt.  Keep all follow-up visits as told by your health care provider. This is important. Contact a health care provider if:  You stop taking your antidepressant medicines, and you have any of these symptoms: ? Nausea. ? Headache. ? Feeling lightheaded. ? Chills and body aches. ? Not being able to sleep (insomnia).  You or your friends and family think your depression is getting worse. Get help right away if:  You have thoughts of hurting yourself or others. If you ever feel like you may hurt yourself or others, or have thoughts about taking your own life, get help right away. You can go to your nearest emergency department or call:  Your local emergency services (911 in the [place]  A suicide crisis helpline, such as the National Suicide Prevention Lifeline at (424)748-4457. This is open 24-hours a day. Summary  If you are living with depression, there are ways to help you recover from it and also ways to prevent it from coming back.  Work with your health care team to create a management plan that includes counseling, stress management techniques, and healthy lifestyle habits. This information is not intended to replace advice given to you by your health care provider. Make sure you discuss any questions you have with your health care provider. Document Revised: 05/27/2018 Document Reviewed: 01/06/2016 Elsevier Patient Education  2020 [employer].

## 2019-08-16 NOTE — Progress Notes (Signed)
[facility]U  Would like to discuss about prescribed meds

## 2019-08-17 ENCOUNTER — Telehealth: Payer: Self-pay | Admitting: Physician Assistant

## 2019-08-17 ENCOUNTER — Encounter: Payer: Self-pay | Admitting: Cardiology

## 2019-08-17 ENCOUNTER — Other Ambulatory Visit: Payer: Self-pay | Admitting: Physician Assistant

## 2019-08-17 ENCOUNTER — Ambulatory Visit (INDEPENDENT_AMBULATORY_CARE_PROVIDER_SITE_OTHER): Payer: Medicaid Other | Admitting: Cardiology

## 2019-08-17 VITALS — BP 100/60 | HR 88 | Ht 62.5 in | Wt 157.0 lb

## 2019-08-17 DIAGNOSIS — I4729 Other ventricular tachycardia: Secondary | ICD-10-CM

## 2019-08-17 DIAGNOSIS — Z720 Tobacco use: Secondary | ICD-10-CM

## 2019-08-17 DIAGNOSIS — E876 Hypokalemia: Secondary | ICD-10-CM

## 2019-08-17 DIAGNOSIS — I472 Ventricular tachycardia: Secondary | ICD-10-CM

## 2019-08-17 DIAGNOSIS — E782 Mixed hyperlipidemia: Secondary | ICD-10-CM | POA: Diagnosis not present

## 2019-08-17 DIAGNOSIS — I2510 Atherosclerotic heart disease of native coronary artery without angina pectoris: Secondary | ICD-10-CM | POA: Diagnosis not present

## 2019-08-17 LAB — BASIC METABOLIC PANEL
BUN/Creatinine Ratio: 14 (ref 9–23)
BUN: 10 mg/dL (ref 6–20)
CO2: 23 mmol/L (ref 20–29)
Calcium: 9.9 mg/dL (ref 8.7–10.2)
Chloride: 102 mmol/L (ref 96–106)
Creatinine, Ser: 0.74 mg/dL (ref 0.57–1.00)
GFR calc Af Amer: 119 mL/min/{1.73_m2} (ref >59)
GFR calc non Af Amer: 103 mL/min/{1.73_m2} (ref >59)
Glucose: 87 mg/dL (ref 65–99)
Potassium: 4.6 mmol/L (ref 3.5–5.2)
Sodium: 139 mmol/L (ref 134–144)

## 2019-08-17 LAB — MAGNESIUM: Magnesium: 1.9 mg/dL (ref 1.6–2.3)

## 2019-08-17 LAB — TSH: TSH: 0.938 u[IU]/mL (ref 0.450–4.500)

## 2019-08-17 LAB — VITAMIN D 25 HYDROXY (VIT D DEFICIENCY, FRACTURES): Vit D, 25-Hydroxy: 24.7 ng/mL — ABNORMAL LOW (ref 30.0–100.0)

## 2019-08-17 MED ORDER — VITAMIN D (ERGOCALCIFEROL) 1.25 MG (50000 UNIT) PO CAPS
50000.0000 [IU] | ORAL_CAPSULE | ORAL | 0 refills | Status: DC
Start: 2019-08-17 — End: 2019-12-29

## 2019-08-17 NOTE — Telephone Encounter (Signed)
Patient is calling because cardiology told the patient about Vitamin D, Ergocalciferol, (DRISDOL) 1.25 MG (50000 UNIT) CAPS capsule [[id number]] Patient is calling for medication management on the medication Please advise CB- [phone]

## 2019-08-17 NOTE — Patient Instructions (Signed)
Medication Instructions:  Your physician recommends that you continue on your current medications as directed. Please refer to the Current Medication list given to you today.  *If you need a refill on your cardiac medications before your next appointment, please call your pharmacy*   Lab Work: In the next week or 2:  LIPID & LFT  If you have labs (blood work) drawn today and your tests are completely normal, you will receive your results only by:  MyChart Message (if you have MyChart) OR  A paper copy in the mail If you have any lab test that is abnormal or we need to change your treatment, we will call you to review the results.   Testing/Procedures: None ordered   Follow-Up: At [facility], you and your health needs are our priority.  As part of our continuing mission to provide you with exceptional heart care, we have created designated Provider Care Teams.  These Care Teams include your primary Cardiologist (physician) and Advanced Practice Providers (APPs -  Physician Assistants and Nurse Practitioners) who all work together to provide you with the care you need, when you need it.  We recommend signing up for the patient portal called "MyChart".  Sign up information is provided on this After Visit Summary.  MyChart is used to connect with patients for Virtual Visits (Telemedicine).  Patients are able to view lab/test results, encounter notes, upcoming appointments, etc.  Non-urgent messages can be sent to your provider as well.   To learn more about what you can do with MyChart, go to [email or web address].    Your next appointment:   3-4  month(s)  The format for your next appointment:   In Person  Provider:   You may see Donato Schultz, MD or one of the following Advanced Practice Providers on your designated Care Team:    Norma Fredrickson, NP  Nada Boozer, NP  Georgie Chard, NP    Other Instructions

## 2019-08-17 NOTE — Progress Notes (Signed)
Cardiology Office Note   Date:  08/17/2019   ID:  Grace Castillo, DOB [birth year], MRN [id number]  PCP:  Anders Simmonds, PA-C  Cardiologist:  Dr. Anne Fu    Chief Complaint  Patient presents with  . Coronary Artery Disease      History of Present Illness: Grace Castillo is a 39 y.o. female who presents for CAD  She has hx of untreated MS, HTN and tobacco use presented to ER 07/03/19 with chest pain, pressure and sharp apin and SOB.  Wend to med center [facility] and was in [place] on tele and converted with IV amiodarone.  She has been having chest pain for about 2 hours.  + FH of premature CAD in her maternal GF at 16.  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 She was continued on IV amiodarone and planned to undergo cardiac cath.  Cath with acute plaque rupture with thrombus on the pLcx with 90% stenosis and diffuse pLAD disease. Complex IVUS PCI to the osital/pLcx which lead to significant plaque shift and thrombus formation in the ostial LAD requiring bifurcation stenting of the distal LM into the LAD and LCx. There was also embolization of occlusive thrombus into the mLAD which was treated with balloon angioplasty.Trop peaked [id number]. On DAPT - ASA brilinta   NSVT runs resolved  with IV amiodarone.  On BB.  On high dose statin, + tobacco use,  MS  Her ECG does show T wave inversions some evolution in the lateral leads. She is not having any further discomfort.  Hepatic and lipids in 7 weeks   Last visit  07/13/19 she did not go to [place] for son's graduation, no one would drive her.  She has no chest pain but some arm pain.  Also decreased appetite. No SOB unless outside in the heat.  seh is eating healthy, and has decreased tobacco to 6 cigarettes per day from 1PPD   - she will continue to decrease.  We discussed meds.  She is worried about  wt gain.  With Wellbutrin she should not have the wt gain.   She is also tearful, discussed this is big life event at any age.  Wellbutrin may help.     Has some rapid heart beats will check K+ that was low prior to discharge and Mg+ level.  No bleeding with ASA and Brilinta.   Tobacco use still at half a pack per day.   Unable to do cardiac rehab due to work schedule.  She had her kids are moving to her own place.  She is active at work walking up 52 steps with each floor and 3 floors. she is SOB with this at the top.  No chest pain. Tolerating ASA and Brilinta.  No syncope.  Feels well.    Past Medical History:  Diagnosis Date  . Hyperlipidemia   . Hypertension   . MS (multiple sclerosis) (HCC)   . NSTEMI (non-ST elevated myocardial infarction) (HCC)   . NSVT (nonsustained ventricular tachycardia) (HCC) 07/02/2019  . S/P angioplasty with stent    DES to LAD and Lcx     Past Surgical History:  Procedure Laterality Date  . CHOLECYSTECTOMY    . CORONARY BALLOON ANGIOPLASTY N/A 07/03/2019   Procedure: CORONARY BALLOON ANGIOPLASTY;  Surgeon: Yvonne Kendall, MD;  [facility]  INVASIVE CV LAB;  Service: Cardiovascular;  Laterality: N/A;  distal lad  . CORONARY STENT INTERVENTION N/A 07/03/2019   Procedure: CORONARY STENT INTERVENTION;  Surgeon: Yvonne Kendall, MD;  Location: [facility] INVASIVE CV LAB;  Service: Cardiovascular;  Laterality: N/A;  cfx lad   . INTRAVASCULAR ULTRASOUND/IVUS N/A 07/03/2019   Procedure: Intravascular Ultrasound/IVUS;  Surgeon: Yvonne Kendall, MD;  Location: [facility] INVASIVE CV LAB;  Service: Cardiovascular;  Laterality: N/A;  . LEFT HEART CATH AND CORONARY ANGIOGRAPHY N/A 07/03/2019   Procedure: LEFT HEART CATH AND CORONARY ANGIOGRAPHY;  Surgeon: Yvonne Kendall, MD;  Location: [facility] INVASIVE CV LAB;  Service: Cardiovascular;  Laterality: N/A;  . TUBAL LIGATION       Current Outpatient Medications  Medication Sig Dispense Refill  . aspirin EC 81 MG EC tablet Take 1  tablet (81 mg total) by mouth daily. 90 tablet 0  . atorvastatin (LIPITOR) 80 MG tablet Take 1 tablet (80 mg total) by mouth daily. 90 tablet 1  . buPROPion (ZYBAN) 150 MG 12 hr tablet Take 150 mg by mouth 2 (two) times daily.    . fluticasone (FLONASE) 50 MCG/ACT nasal spray Place 2 sprays into both nostrils daily. 16 g 6  . metoprolol tartrate (LOPRESSOR) 25 MG tablet Take 0.5 tablets (12.5 mg total) by mouth 2 (two) times daily. 90 tablet 0  . nitroGLYCERIN (NITROSTAT) 0.4 MG SL tablet Place 1 tablet (0.4 mg total) under the tongue every 5 (five) minutes x 3 doses as needed for chest pain. 25 tablet 2  . sertraline (ZOLOFT) 50 MG tablet Take 1/2 tab daily for 1 week then take 1 tab daily 30 tablet 3  . ticagrelor (BRILINTA) 90 MG TABS tablet Take 1 tablet (90 mg total) by mouth 2 (two) times daily. 180 tablet 2  . Vitamin D, Ergocalciferol, (DRISDOL) 1.25 MG (50000 UNIT) CAPS capsule Take 1 capsule (50,000 Units total) by mouth every 7 (seven) days. 16 capsule 0   No current facility-administered medications for this visit.    Allergies:   Pork-derived products and Sulfa antibiotics    Social History:  The patient  reports that she has been smoking cigarettes. She has been smoking about 1.00 pack per day. She has never used smokeless tobacco. She reports current drug use. Drug: Marijuana. She reports that she does not drink alcohol.   Family History:  The patient's family history includes Sudden Cardiac Death in her maternal grandfather.    ROS:  General:no colds or fevers, no weight changes Skin:no rashes or ulcers HEENT:no blurred vision, no congestion CV:see HPI PUL:see HPI GI:no diarrhea constipation or melena, no indigestion GU:no hematuria, no dysuria MS:no joint pain, no claudication Neuro:no syncope, no lightheadedness Endo:no diabetes, no thyroid disease  Wt Readings from Last 3 Encounters:  08/17/19 157 lb (71.2 kg)  08/16/19 158 lb (71.7 kg)  07/13/19 157 lb (71.2 kg)      PHYSICAL EXAM: VS:  BP 100/60   Pulse 88   Ht 5' 2.5" (1.588 m)   Wt 157 lb (71.2 kg)   SpO2 96%   BMI 28.26 kg/m  , BMI Body mass index is 28.26 kg/m. General:Pleasant affect, NAD Skin:Warm and dry, brisk capillary refill HEENT:normocephalic, sclera clear, mucus membranes moist Neck:supple, no JVD, no bruits  Heart:S1S2 RRR without murmur, gallup, rub or click Lungs:clear without rales, rhonchi, or wheezes SMO:LMBE, non tender, + BS, do not palpate liver spleen or masses Ext:no lower ext edema, 2+ pedal pulses, 2+ radial pulses Neuro:alert and oriented, MAE, follows  commands, + facial symmetry    EKG:  EKG is NOT ordered today.   Recent Labs: 07/04/2019: Hemoglobin 11.9; Platelets 182 08/16/2019: BUN 10; Creatinine, Ser 0.74; Magnesium 1.9; Potassium 4.6; Sodium 139; TSH 0.938    Lipid Panel    Component Value Date/Time   CHOL 165 07/04/2019 0537   TRIG 83 07/04/2019 0537   HDL 27 (L) 07/04/2019 0537   CHOLHDL 6.1 07/04/2019 0537   VLDL 17 07/04/2019 0537   LDLCALC 121 (H) 07/04/2019 0537       Other studies Reviewed: Additional studies/ records that were reviewed today include: .  Cath: 07/03/19  Conclusions: 1. Multivessel coronary artery disease, including mild to moderate diffuse proximal LAD disease, acute plaque rupture with thrombus involving the ostial/proximal LCx with 90% stenosis, and 40% tubular lesion in the proximal RCA. 2. Mildly elevated left ventricular filling pressure. 3. Complex IVUS guided PCI to the ostial/proximal LCx leading to significant plaque shift and thrombus formation in the ostial LAD requiring bifurcation stenting of the distal LMCA into the LAD and LCx using a crush technique (Resolute Onyx 3.0 x 22 mm drug-eluting stent in the LMCA/LAD and Resolute Onyx 3.5 x 12 mm drug-eluting stent in the LCx). There was also embolization of occlusive thrombus into the mid LAD, successfully treated with balloon  angioplasty.  Recommendations: 1. Continue bivalirudin infusion for 2 hours and tirofiban infusion for 18 hours. 2. Titrate nitroglycerin infusion for relief of chest pain. 3. Dual antiplatelet therapy with aspirin and ticagrelor for at least 12 months, ideally longer in light of concern for hypercoagulable state given significant intracoronary thrombus formation despite adequate anticoagulation during the procedure. 4. Obtain a limited echo tomorrow to reassess LVEF following complex PCI leading to temporary closure of the LAD and LCx. 5. Aggressive secondary prevention, including high intensity statin therapy and smoking cessation.  Yvonne Kendall, MD [facility]  Diagnostic Dominance: Right  Intervention   Echo: 07/03/19  IMPRESSIONS    1. Left ventricular ejection fraction, by estimation, is 50 to 55%. The  left ventricle has low normal function. The left ventricle has no regional  wall motion abnormalities. Left ventricular diastolic parameters were  normal.  2. Right ventricular systolic function is normal. The right ventricular  size is normal. Tricuspid regurgitation signal is inadequate for assessing  PA pressure.  3. The mitral valve is normal in structure. Trivial mitral valve  regurgitation. No evidence of mitral stenosis.  4. The aortic valve has an indeterminant number of cusps. Aortic valve  regurgitation is not visualized. No aortic stenosis is present.  5. The inferior vena cava is normal in size with greater than 50%  respiratory variability, suggesting right atrial pressure of 3 mmHg.   Conclusion(s)/Recommendation(s): Low normal EF. There are no clear wall  motion abnormalities that are consistent across views, but the  inferior/inferoseptal/inferolateral walls appear to thicken less than the  other walls.  _____________  ASSESSMENT AND PLAN:  1.  Recent NSTEMI with CAD and stents to LAD and LCX on brilinta and asa, has returned to work  and doing well, unable to do cardiac rehab due to work schedule.  Follow pt in 3-4 months with Dr. Anne Fu  2.  Hx NSVT in hospital but labs stable now and no rapid heart beats \  3.  HLD on statin, check lipids and hepatic.   4.  Multiple sclerosis to see Neuro this month.   5.  Tobacco use on Welbutrin trying to decrease.    6.  Depression does have PCP now to manage   Current medicines are reviewed with the patient today.  The patient Has no concerns regarding medicines.  The following changes have been made:  See above Labs/ tests ordered today include:see above  Disposition:   FU:  see above  Signed, Nada Boozer, NP  08/17/2019 8:41 PM    [facility] [address] [place], [place], [place]  [address]/ [address] [place], [place] Phone: [phone]; Fax: [phone]  [phone]

## 2019-08-17 NOTE — Telephone Encounter (Signed)
Form received and completed.  Ready to be signed.  [facility] asking since Dr Anne Fu will not be back in the office until 7/12 if another MD can sign.  Advised  I will ask and fax it to them as soon as possible.

## 2019-08-17 NOTE — Telephone Encounter (Signed)
Patient is calling back to speak to [place] regarding lab work from yesterday. CB- [phone]

## 2019-08-18 NOTE — Telephone Encounter (Signed)
Pt is aware of results per [facility]

## 2019-08-18 NOTE — Telephone Encounter (Signed)
Please advise 

## 2019-08-22 NOTE — Telephone Encounter (Signed)
Please follow up with pt

## 2019-08-23 NOTE — Telephone Encounter (Signed)
  As instructed by NP/MD called pt/ name and DOB verified/ made aware of MD/NP/PA note. Verbalized understanding

## 2019-08-23 NOTE — Telephone Encounter (Signed)
Please call patient.  Her vitamin D is low.  This can contribute to muscle aches, anxiety, fatigue, and depression.  I have sent a prescription to the pharmacy for them to take once a week.  We will recheck this level in 3-4 months.    Thanks, Georgian Co, PA-C

## 2019-08-28 NOTE — Telephone Encounter (Signed)
Form completed and signed by Dr Anne Fu.  Taken to MR to be faxed and scanned into Epic.

## 2019-09-01 ENCOUNTER — Other Ambulatory Visit: Payer: Medicaid Other | Admitting: *Deleted

## 2019-09-01 ENCOUNTER — Other Ambulatory Visit: Payer: Self-pay

## 2019-09-01 DIAGNOSIS — E782 Mixed hyperlipidemia: Secondary | ICD-10-CM

## 2019-09-01 DIAGNOSIS — I4729 Other ventricular tachycardia: Secondary | ICD-10-CM

## 2019-09-01 DIAGNOSIS — E876 Hypokalemia: Secondary | ICD-10-CM

## 2019-09-01 DIAGNOSIS — I2510 Atherosclerotic heart disease of native coronary artery without angina pectoris: Secondary | ICD-10-CM

## 2019-09-01 LAB — HEPATIC FUNCTION PANEL
ALT: 25 IU/L (ref 0–32)
AST: 29 IU/L (ref 0–40)
Albumin: 4.4 g/dL (ref 3.8–4.8)
Alkaline Phosphatase: 90 IU/L (ref 48–121)
Bilirubin Total: 0.7 mg/dL (ref 0.0–1.2)
Bilirubin, Direct: 0.2 mg/dL (ref 0.00–0.40)
Total Protein: 6.9 g/dL (ref 6.0–8.5)

## 2019-09-01 LAB — LIPID PANEL
Chol/HDL Ratio: 3.5 ratio (ref 0.0–4.4)
Cholesterol, Total: 114 mg/dL (ref 100–199)
HDL: 33 mg/dL — ABNORMAL LOW (ref >39)
LDL Chol Calc (NIH): 65 mg/dL (ref 0–99)
Triglycerides: 80 mg/dL (ref 0–149)
VLDL Cholesterol Cal: 16 mg/dL (ref 5–40)

## 2019-09-08 ENCOUNTER — Other Ambulatory Visit: Payer: Self-pay

## 2019-09-08 ENCOUNTER — Ambulatory Visit: Payer: Medicaid Other | Attending: Family Medicine | Admitting: Licensed Clinical Social Worker

## 2019-09-08 DIAGNOSIS — F331 Major depressive disorder, recurrent, moderate: Secondary | ICD-10-CM

## 2019-09-08 NOTE — BH Specialist Note (Signed)
Integrated Behavioral Health Visit via Telemedicine (Telephone)  09/08/2019 Grace Castillo [id number]   Session Start time: 11:10 AM  Session End time: 11:30 AM Total time: 20  Referring Provider: Trena Platt Type of Visit: Telephonic Patient location: Home [facility] Provider location: Office All persons participating in visit: LCSW and Patient  Confirmed patient's address: Yes  Confirmed patient's phone number: Yes  Any changes to demographics: No   Confirmed patient's insurance: Yes  Any changes to patient's insurance: No   Discussed confidentiality: Yes    The following statements were read to the patient and/or legal guardian that are established with the [facility] Provider.  "The purpose of this phone visit is to provide behavioral health care while limiting exposure to the coronavirus (COVID19).  There is a possibility of technology failure and discussed alternative modes of communication if that failure occurs."  "By engaging in this telephone visit, you consent to the provision of healthcare.  Additionally, you authorize for your insurance to be billed for the services provided during this telephone visit."   Patient and/or legal guardian consented to telephone visit: Yes   PRESENTING CONCERNS: Patient and/or family reports the following symptoms/concerns: Pt reports that she has never been diagnosed with depression or anxiety; however, reports ongoing sadness, crying, low motivation for 4-5 months. Not sleeping, wake up every 2.5 hours mind is always racing thoughts, irritability. Difficulty with concentration Limited support system Been on meds for almost a month no decrease in symptoms or side effects Duration of problem: 5 months; Severity of problem: moderate  STRENGTHS (Protective Factors/Coping Skills): Pt is participating in medication mangement  GOALS ADDRESSED: Patient will: 1.  Reduce symptoms of: anxiety and depression  2.  Increase knowledge and/or  ability of: coping skills and healthy habits  3.  Demonstrate ability to: Increase healthy adjustment to current life circumstances and Increase adequate support systems for patient/family  INTERVENTIONS: Interventions utilized:  Solution-Focused Strategies, Supportive Counseling and Psychoeducation and/or Health Education Standardized Assessments completed: Not Needed  ASSESSMENT: Patient currently experiencing symptoms of depression and anxiety.   Patient may benefit from ongoing medication management and therapy. Therapeutic interventions discussed and compliance with meds strongly encouraged. Pt has scheduled appt with PCP to follow up on 08/13  PLAN: 1. Follow up with behavioral health clinician on : Contact LCSW with additional behavioral health and/or resource needs 2. Behavioral recommendations: Utilize strategies discussed and comply with meds 3. Referral(s): Integrated [facility] (In Clinic)  Bridgett Larsson, [place] 09/14/19 1:00 PM

## 2019-09-29 ENCOUNTER — Other Ambulatory Visit: Payer: Self-pay

## 2019-09-29 ENCOUNTER — Ambulatory Visit: Payer: Medicaid Other | Attending: Internal Medicine | Admitting: Internal Medicine

## 2019-10-03 ENCOUNTER — Encounter: Payer: Self-pay | Admitting: Physician Assistant

## 2019-10-05 NOTE — Progress Notes (Deleted)
NEUROLOGY CONSULTATION NOTE  Grace Castillo MRN: [id number] DOB: [birth year]  Referring provider: Nada Boozer, NP Primary care provider: Georgian Co, PA-C  Reason for consult:  Multiple sclerosis  HISTORY OF PRESENT ILLNESS: Grace Castillo is a 39 year old ***-handed female with MS, HTN, NSVT, tobacco abuse, CAD s/p NSTEMI and history of kidney stones s/p stents and lithotripsy who presents for multiple sclerosis.  History supplemented by hospital and referring provider's notes.  ***  She has complicated cardiac history with premature coronary artery disease wand was hospitalized in May with NSVT and NSTEMI status post stents to LAD and LCX.    Vitamin D level from 08/16/2019 was 24.7.  She was started on supplementation (50,000 IU weekly).  BMP and hepatic panel from 08/16/2019 and 09/01/2019 respectively were normal.  PAST MEDICAL HISTORY: Past Medical History:  Diagnosis Date  . Hyperlipidemia   . Hypertension   . MS (multiple sclerosis) (HCC)   . NSTEMI (non-ST elevated myocardial infarction) (HCC)   . NSVT (nonsustained ventricular tachycardia) (HCC) 07/02/2019  . S/P angioplasty with stent    DES to LAD and Lcx     PAST SURGICAL HISTORY: Past Surgical History:  Procedure Laterality Date  . CHOLECYSTECTOMY    . CORONARY BALLOON ANGIOPLASTY N/A 07/03/2019   Procedure: CORONARY BALLOON ANGIOPLASTY;  Surgeon: Yvonne Kendall, MD;  Location: [facility] INVASIVE CV LAB;  Service: Cardiovascular;  Laterality: N/A;  distal lad  . CORONARY STENT INTERVENTION N/A 07/03/2019   Procedure: CORONARY STENT INTERVENTION;  Surgeon: Yvonne Kendall, MD;  Location: [facility] INVASIVE CV LAB;  Service: Cardiovascular;  Laterality: N/A;  cfx lad   . INTRAVASCULAR ULTRASOUND/IVUS N/A 07/03/2019   Procedure: Intravascular Ultrasound/IVUS;  Surgeon: Yvonne Kendall, MD;  Location: [facility] INVASIVE CV LAB;  Service: Cardiovascular;  Laterality: N/A;  . LEFT HEART CATH AND CORONARY ANGIOGRAPHY N/A 07/03/2019    Procedure: LEFT HEART CATH AND CORONARY ANGIOGRAPHY;  Surgeon: Yvonne Kendall, MD;  Location: [facility] INVASIVE CV LAB;  Service: Cardiovascular;  Laterality: N/A;  . TUBAL LIGATION      MEDICATIONS: Current Outpatient Medications on File Prior to Visit  Medication Sig Dispense Refill  . aspirin EC 81 MG EC tablet Take 1 tablet (81 mg total) by mouth daily. 90 tablet 0  . atorvastatin (LIPITOR) 80 MG tablet Take 1 tablet (80 mg total) by mouth daily. 90 tablet 1  . buPROPion (ZYBAN) 150 MG 12 hr tablet Take 150 mg by mouth 2 (two) times daily.    . fluticasone (FLONASE) 50 MCG/ACT nasal spray Place 2 sprays into both nostrils daily. 16 g 6  . metoprolol tartrate (LOPRESSOR) 25 MG tablet Take 0.5 tablets (12.5 mg total) by mouth 2 (two) times daily. 90 tablet 0  . nitroGLYCERIN (NITROSTAT) 0.4 MG SL tablet Place 1 tablet (0.4 mg total) under the tongue every 5 (five) minutes x 3 doses as needed for chest pain. 25 tablet 2  . sertraline (ZOLOFT) 50 MG tablet Take 1/2 tab daily for 1 week then take 1 tab daily 30 tablet 3  . ticagrelor (BRILINTA) 90 MG TABS tablet Take 1 tablet (90 mg total) by mouth 2 (two) times daily. 180 tablet 2  . Vitamin D, Ergocalciferol, (DRISDOL) 1.25 MG (50000 UNIT) CAPS capsule Take 1 capsule (50,000 Units total) by mouth every 7 (seven) days. 16 capsule 0   No current facility-administered medications on file prior to visit.    ALLERGIES: Allergies  Allergen Reactions  . Pork-Derived Products Anaphylaxis  . Sulfa Antibiotics Nausea And  Vomiting    FAMILY HISTORY: Family History  Problem Relation Age of Onset  . Sudden Cardiac Death Maternal Grandfather    ***.  SOCIAL HISTORY: Social History   Socioeconomic History  . Marital status: Single    Spouse name: Not on file  . Number of children: Not on file  . Years of education: Not on file  . Highest education level: Not on file  Occupational History  . Not on file  Tobacco Use  . Smoking status:  Current Every Day Smoker    Packs/day: 1.00    Types: Cigarettes  . Smokeless tobacco: Never Used  Substance and Sexual Activity  . Alcohol use: Never  . Drug use: Yes    Types: Marijuana  . Sexual activity: Yes  Other Topics Concern  . Not on file  Social History Narrative  . Not on file   Social Determinants of Health   Financial Resource Strain:   . Difficulty of Paying Living Expenses: Not on file  Food Insecurity:   . Worried About Programme researcher, broadcasting/film/video in the Last Year: Not on file  . Ran Out of Food in the Last Year: Not on file  Transportation Needs:   . Lack of Transportation (Medical): Not on file  . Lack of Transportation (Non-Medical): Not on file  Physical Activity:   . Days of Exercise per Week: Not on file  . Minutes of Exercise per Session: Not on file  Stress:   . Feeling of Stress : Not on file  Social Connections:   . Frequency of Communication with Friends and Family: Not on file  . Frequency of Social Gatherings with Friends and Family: Not on file  . Attends Religious Services: Not on file  . Active Member of Clubs or Organizations: Not on file  . Attends Banker Meetings: Not on file  . Marital Status: Not on file  Intimate Partner Violence:   . Fear of Current or Ex-Partner: Not on file  . Emotionally Abused: Not on file  . Physically Abused: Not on file  . Sexually Abused: Not on file    REVIEW OF SYSTEMS: Constitutional: No fevers, chills, or sweats, no generalized fatigue, change in appetite Eyes: No visual changes, double vision, eye pain Ear, nose and throat: No hearing loss, ear pain, nasal congestion, sore throat Cardiovascular: No chest pain, palpitations Respiratory:  No shortness of breath at rest or with exertion, wheezes GastrointestinaI: No nausea, vomiting, diarrhea, abdominal pain, fecal incontinence Genitourinary:  No dysuria, urinary retention or frequency Musculoskeletal:  No neck pain, back pain Integumentary:  No rash, pruritus, skin lesions Neurological: as above Psychiatric: No depression, insomnia, anxiety Endocrine: No palpitations, fatigue, diaphoresis, mood swings, change in appetite, change in weight, increased thirst Hematologic/Lymphatic:  No purpura, petechiae. Allergic/Immunologic: no itchy/runny eyes, nasal congestion, recent allergic reactions, rashes  PHYSICAL EXAM: *** General: No acute distress.  Patient appears ***-groomed.  *** Head:  Normocephalic/atraumatic Eyes:  fundi examined but not visualized Neck: supple, no paraspinal tenderness, full range of motion Back: No paraspinal tenderness Heart: regular rate and rhythm Lungs: Clear to auscultation bilaterally. Vascular: No carotid bruits. Neurological Exam: Mental status: alert and oriented to person, place, and time, recent and remote memory intact, fund of knowledge intact, attention and concentration intact, speech fluent and not dysarthric, language intact. Cranial nerves: CN I: not tested CN II: pupils equal, round and reactive to light, visual fields intact CN III, IV, VI:  full range of motion, no nystagmus,  no ptosis CN V: facial sensation intact CN VII: upper and lower face symmetric CN VIII: hearing intact CN IX, X: gag intact, uvula midline CN XI: sternocleidomastoid and trapezius muscles intact CN XII: tongue midline Bulk & Tone: normal, no fasciculations. Motor:  5/5 throughout *** Sensation:  Pinprick *** temperature *** and vibration sensation intact.  ***. Deep Tendon Reflexes:  2+ throughout, *** toes downgoing.  *** Finger to nose testing:  Without dysmetria.  *** Heel to shin:  Without dysmetria.  *** Gait:  Normal station and stride.  Able to turn and tandem walk. Romberg ***.  IMPRESSION: ***  PLAN: ***  Thank you for allowing me to take part in the care of this patient.  Shon Millet, DO  CC: ***

## 2019-10-06 ENCOUNTER — Ambulatory Visit: Payer: Medicaid Other | Admitting: Neurology

## 2019-11-16 ENCOUNTER — Other Ambulatory Visit: Payer: Self-pay | Admitting: Cardiology

## 2019-12-08 ENCOUNTER — Other Ambulatory Visit: Payer: Self-pay

## 2019-12-08 ENCOUNTER — Encounter: Payer: Self-pay | Admitting: Cardiology

## 2019-12-08 ENCOUNTER — Ambulatory Visit (INDEPENDENT_AMBULATORY_CARE_PROVIDER_SITE_OTHER): Payer: Medicaid Other | Admitting: Cardiology

## 2019-12-08 VITALS — BP 110/70 | HR 86 | Ht 62.5 in | Wt 166.0 lb

## 2019-12-08 DIAGNOSIS — I4729 Other ventricular tachycardia: Secondary | ICD-10-CM

## 2019-12-08 DIAGNOSIS — I472 Ventricular tachycardia: Secondary | ICD-10-CM | POA: Diagnosis not present

## 2019-12-08 DIAGNOSIS — I2510 Atherosclerotic heart disease of native coronary artery without angina pectoris: Secondary | ICD-10-CM

## 2019-12-08 NOTE — Patient Instructions (Signed)
Medication Instructions:  The current medical regimen is effective;  continue present plan and medications.  *If you need a refill on your cardiac medications before your next appointment, please call your pharmacy*  Follow-Up: At [facility], you and your health needs are our priority.  As part of our continuing mission to provide you with exceptional heart care, we have created designated Provider Care Teams.  These Care Teams include your primary Cardiologist (physician) and Advanced Practice Providers (APPs -  Physician Assistants and Nurse Practitioners) who all work together to provide you with the care you need, when you need it.  We recommend signing up for the patient portal called "MyChart".  Sign up information is provided on this After Visit Summary.  MyChart is used to connect with patients for Virtual Visits (Telemedicine).  Patients are able to view lab/test results, encounter notes, upcoming appointments, etc.  Non-urgent messages can be sent to your provider as well.   To learn more about what you can do with MyChart, go to [email or web address].    Your next appointment:   6 month(s)  The format for your next appointment:   In Person  Provider:   Laura Ingold, NP   Thank you for choosing [facility]!!     

## 2019-12-08 NOTE — Progress Notes (Signed)
Cardiology Office Note:    Date:  12/08/2019   ID:  Grace Castillo, DOB 1980-07-29, MRN [id number]  PCP:  Anders Simmonds, PA-C  [facility] Cardiologist:  Donato Schultz, MD  [facility] Electrophysiologist:  None   Referring MD: Anders Simmonds, PA-C     History of Present Illness:    Grace Castillo is a 39 y.o. female here for the follow-up of coronary artery disease.  Had non-ST elevation myocardial infarction with CAD, stents to the LAD, left circumflex on Brilinta and aspirin.  Seems to be doing well.  Frustrated by tobacco cessation and weight gain.  It was may 2021 where she had chest pain pressure ventricular tachycardia converted with IV amiodarone and cardiac catheterization that showed proximal left circumflex with 90% stenosis and diffuse proximal LAD disease.  IVIS PCI to circumflex with thrombus, plaque shift requiring bifurcation stenting of the distal left main into the LAD and left circumflex.  Occlusive thrombus went into the mid LAD which was treated with balloon angioplasty.  Peak troponin was 6000.  She is having rare fleeting chest discomfort.    Past Medical History:  Diagnosis Date  . Hyperlipidemia   . Hypertension   . MS (multiple sclerosis) (HCC)   . NSTEMI (non-ST elevated myocardial infarction) (HCC)   . NSVT (nonsustained ventricular tachycardia) (HCC) 07/02/2019  . S/P angioplasty with stent    DES to LAD and Lcx     Past Surgical History:  Procedure Laterality Date  . CHOLECYSTECTOMY    . CORONARY BALLOON ANGIOPLASTY N/A 07/03/2019   Procedure: CORONARY BALLOON ANGIOPLASTY;  Surgeon: Yvonne Kendall, MD;  [facility];  Service: Cardiovascular;  Laterality: N/A;  distal lad  . CORONARY STENT INTERVENTION N/A 07/03/2019   Procedure: CORONARY STENT INTERVENTION;  Surgeon: Yvonne Kendall, MD;  [facility];  Service: Cardiovascular;  Laterality: N/A;  cfx lad   . INTRAVASCULAR ULTRASOUND/IVUS N/A 07/03/2019     Procedure: Intravascular Ultrasound/IVUS;  Surgeon: Yvonne Kendall, MD;  [facility];  Service: Cardiovascular;  Laterality: N/A;  . LEFT HEART CATH AND CORONARY ANGIOGRAPHY N/A 07/03/2019   Procedure: LEFT HEART CATH AND CORONARY ANGIOGRAPHY;  Surgeon: Yvonne Kendall, MD;  [facility];  Service: Cardiovascular;  Laterality: N/A;  . TUBAL LIGATION      Current Medications: Current Meds  Medication Sig  . aspirin EC 81 MG EC tablet Take 1 tablet (81 mg total) by mouth daily.  Marland Kitchen atorvastatin (LIPITOR) 80 MG tablet Take 1 tablet (80 mg total) by mouth daily.  Marland Kitchen buPROPion (ZYBAN) 150 MG 12 hr tablet Take 150 mg by mouth 2 (two) times daily.  . fluticasone (FLONASE) 50 MCG/ACT nasal spray Place 2 sprays into both nostrils daily.  . metoprolol tartrate (LOPRESSOR) 25 MG tablet Take 0.5 tablets (12.5 mg total) by mouth 2 (two) times daily.  . nitroGLYCERIN (NITROSTAT) 0.4 MG SL tablet PLACE 1 TABLET UNDER THE TONGUE EVERY 5 MINUTES X 3 DOSES AS NEEDED FOR CHEST PAIN  . sertraline (ZOLOFT) 50 MG tablet Take 1/2 tab daily for 1 week then take 1 tab daily (Patient taking differently: Take 50 mg by mouth daily. )  . ticagrelor (BRILINTA) 90 MG TABS tablet Take 1 tablet (90 mg total) by mouth 2 (two) times daily.  . Vitamin D, Ergocalciferol, (DRISDOL) 1.25 MG (50000 UNIT) CAPS capsule Take 1 capsule (50,000 Units total) by mouth every 7 (seven) days.     Allergies:  Pork-derived products and Sulfa antibiotics   Social History   Socioeconomic History  . Marital status: Single    Spouse name: Not on file  . Number of children: Not on file  . Years of education: Not on file  . Highest education level: Not on file  Occupational History  . Not on file  Tobacco Use  . Smoking status: Current Every Day Smoker    Packs/day: 1.00    Types: Cigarettes  . Smokeless tobacco: Never Used  Substance and Sexual Activity  . Alcohol use: Never  . Drug use: Yes     Types: Marijuana  . Sexual activity: Yes  Other Topics Concern  . Not on file  Social History Narrative  . Not on file   Social Determinants of Health   Financial Resource Strain:   . Difficulty of Paying Living Expenses: Not on file  Food Insecurity:   . Worried About Programme researcher, broadcasting/film/video in the Last Year: Not on file  . Ran Out of Food in the Last Year: Not on file  Transportation Needs:   . Lack of Transportation (Medical): Not on file  . Lack of Transportation (Non-Medical): Not on file  Physical Activity:   . Days of Exercise per Week: Not on file  . Minutes of Exercise per Session: Not on file  Stress:   . Feeling of Stress : Not on file  Social Connections:   . Frequency of Communication with Friends and Family: Not on file  . Frequency of Social Gatherings with Friends and Family: Not on file  . Attends Religious Services: Not on file  . Active Member of Clubs or Organizations: Not on file  . Attends Banker Meetings: Not on file  . Marital Status: Not on file     Family History: The patient's family history includes Sudden Cardiac Death in her maternal grandfather.  ROS:   Please see the history of present illness.     All other systems reviewed and are negative.  EKGs/Labs/Other Studies Reviewed:     Recent Labs: 07/04/2019: Hemoglobin 11.9; Platelets 182 08/16/2019: BUN 10; Creatinine, Ser 0.74; Magnesium 1.9; Potassium 4.6; Sodium 139; TSH 0.938 09/01/2019: ALT 25  Recent Lipid Panel    Component Value Date/Time   CHOL 114 09/01/2019 1029   TRIG 80 09/01/2019 1029   HDL 33 (L) 09/01/2019 1029   CHOLHDL 3.5 09/01/2019 1029   CHOLHDL 6.1 07/04/2019 0537   VLDL 17 07/04/2019 0537   LDLCALC 65 09/01/2019 1029     Risk Assessment/Calculations:       Physical Exam:    VS:  BP 110/70   Pulse 86   Ht 5' 2.5" (1.588 m)   Wt 166 lb (75.3 kg)   SpO2 99%   BMI 29.88 kg/m     Wt Readings from Last 3 Encounters:  12/08/19 166 lb (75.3  kg)  08/17/19 157 lb (71.2 kg)  08/16/19 158 lb (71.7 kg)     GEN:  Well nourished, well developed in no acute distress HEENT: Normal NECK: No JVD; No carotid bruits LYMPHATICS: No lymphadenopathy CARDIAC: RRR, no murmurs, rubs, gallops RESPIRATORY:  Clear to auscultation without rales, wheezing or rhonchi  ABDOMEN: Soft, non-tender, non-distended MUSCULOSKELETAL:  No edema; No deformity  SKIN: Warm and dry NEUROLOGIC:  Alert and oriented x 3 PSYCHIATRIC:  Normal affect   ASSESSMENT:    1. Coronary artery disease involving native coronary artery of native heart without angina pectoris   2. NSVT (  nonsustained ventricular tachycardia) (HCC)    PLAN:    In order of problems listed above:  Coronary disease -Non-ST elevation microinfarction, stents to both distal LM, LAD and circumflex.  Has been on Brilinta and aspirin.  Unable to do cardiac rehab because of work schedule.  Diagnostic Dominance: Right  Intervention     Nonsustained ventricular tachycardia -Stable now.  Post intervention.  Converted with amiodarone previously.  Hyperlipidemia -Continue with high intensity statin therapy Lipitor 80 last LDL 65 excellent.  ALT 25.  TSH 0.9.  Multiple sclerosis -Seeing neurology.  Tobacco use -Continue to encourage cessation.  Has tried Wellbutrin.  Frustrated by the weight gain with tobacco cessation.  Talked about decreasing carbohydrates.  Depression -PCP assisting with this.  Recurrent sinus infection -She has reached out to [employer], PA's office for amoxicillin refill.  Has not heard back.  I will send a message on her behalf.  Medication Adjustments/Labs and Tests Ordered: Current medicines are reviewed at length with the patient today.  Concerns regarding medicines are outlined above.  No orders of the defined types were placed in this encounter.  No orders of the defined types were placed in this encounter.   Patient Instructions  Medication  Instructions:  The current medical regimen is effective;  continue present plan and medications.  *If you need a refill on your cardiac medications before your next appointment, please call your pharmacy*  Follow-Up: At [facility], you and your health needs are our priority.  As part of our continuing mission to provide you with exceptional heart care, we have created designated Provider Care Teams.  These Care Teams include your primary Cardiologist (physician) and Advanced Practice Providers (APPs -  Physician Assistants and Nurse Practitioners) who all work together to provide you with the care you need, when you need it.  We recommend signing up for the patient portal called "MyChart".  Sign up information is provided on this After Visit Summary.  MyChart is used to connect with patients for Virtual Visits (Telemedicine).  Patients are able to view lab/test results, encounter notes, upcoming appointments, etc.  Non-urgent messages can be sent to your provider as well.   To learn more about what you can do with MyChart, go to [email or web address].    Your next appointment:   6 month(s)  The format for your next appointment:   In Person  Provider:   Nada Boozer, NP  Thank you for choosing [facility]!!        Signed, Donato Schultz, MD  12/08/2019 9:44 AM    [facility]

## 2019-12-20 ENCOUNTER — Other Ambulatory Visit: Payer: Self-pay | Admitting: Physician Assistant

## 2019-12-20 NOTE — Telephone Encounter (Signed)
Requested medication (s) are due for refill today: yes  Requested medication (s) are on the active medication list: yes  Last refill:  08/17/19 #16 0 refills  Future visit scheduled: yes with Neurology   Notes to clinic:  not delegated per protocol     Requested Prescriptions  Pending Prescriptions Disp Refills   Vitamin D, Ergocalciferol, (DRISDOL) 1.25 MG (50000 UNIT) CAPS capsule [Pharmacy Med Name: VITAMIN D2 50,000IU (ERGO) CAP RX] 16 capsule 0    Sig: TAKE 1 CAPSULE BY MOUTH EVERY 7 DAYS      Endocrinology:  Vitamins - Vitamin D Supplementation Failed - 12/20/2019 11:33 AM      Failed - 50,000 IU strengths are not delegated      Failed - Phosphate in normal range and within 360 days    No results found for: PHOS        Failed - Vitamin D in normal range and within 360 days    Vit D, 25-Hydroxy  Date Value Ref Range Status  08/16/2019 24.7 (L) 30.0 - 100.0 ng/mL Final    Comment:    Vitamin D deficiency has been defined by the Institute of Medicine and an Endocrine Society practice guideline as a level of serum 25-OH vitamin D less than 20 ng/mL (1,2). The Endocrine Society went on to further define vitamin D insufficiency as a level between 21 and 29 ng/mL (2). 1. IOM (Institute of Medicine). 2010. Dietary reference    intakes for calcium and D. [place]: The    [employer]. 2. Holick MF, [place], Bischoff-Ferrari HA, et al.    Evaluation, treatment, and prevention of vitamin D    deficiency: an Endocrine Society clinical practice    guideline. JCEM. 2011 Jul; 96(7):1911-30.           Passed - Ca in normal range and within 360 days    Calcium  Date Value Ref Range Status  08/16/2019 9.9 8.7 - 10.2 mg/dL Final          Passed - Valid encounter within last 12 months    Recent Outpatient Visits           4 months ago Depression, unspecified depression type   [facility], Marzella Schlein, [place]        Future Appointments             In 1 week Drema Dallas, DO [facility] [place]

## 2019-12-28 NOTE — Progress Notes (Deleted)
NEUROLOGY CONSULTATION NOTE  Grace Castillo MRN: [id number] DOB: Sep 14, 1980  Referring provider: Georgian Co, PA-C Primary care provider: Georgian Co, PA-C  Reason for consult:  Multiple sclerosis   Subjective:  Grace Castillo is a 39 year old ***-handed female with CAD s/p NSTEMI, NSVT, HTN, HLD and tobacco abuse who presents for multiple sclerosis.  History supplemented by hospital, cardiology and PCP notes.  ***  She had a NSTEMI in May and underwent angioplasty and stent DES to LAD and LCx.  She is currently on ***     PAST MEDICAL HISTORY: Past Medical History:  Diagnosis Date  . Hyperlipidemia   . Hypertension   . MS (multiple sclerosis) (HCC)   . NSTEMI (non-ST elevated myocardial infarction) (HCC)   . NSVT (nonsustained ventricular tachycardia) (HCC) 07/02/2019  . S/P angioplasty with stent    DES to LAD and Lcx     PAST SURGICAL HISTORY: Past Surgical History:  Procedure Laterality Date  . CHOLECYSTECTOMY    . CORONARY BALLOON ANGIOPLASTY N/A 07/03/2019   Procedure: CORONARY BALLOON ANGIOPLASTY;  Surgeon: Yvonne Kendall, MD;  [facility];  Service: Cardiovascular;  Laterality: N/A;  distal lad  . CORONARY STENT INTERVENTION N/A 07/03/2019   Procedure: CORONARY STENT INTERVENTION;  Surgeon: Yvonne Kendall, MD;  [facility];  Service: Cardiovascular;  Laterality: N/A;  cfx lad   . INTRAVASCULAR ULTRASOUND/IVUS N/A 07/03/2019   Procedure: Intravascular Ultrasound/IVUS;  Surgeon: Yvonne Kendall, MD;  [facility];  Service: Cardiovascular;  Laterality: N/A;  . LEFT HEART CATH AND CORONARY ANGIOGRAPHY N/A 07/03/2019   Procedure: LEFT HEART CATH AND CORONARY ANGIOGRAPHY;  Surgeon: Yvonne Kendall, MD;  [facility];  Service: Cardiovascular;  Laterality: N/A;  . TUBAL LIGATION      MEDICATIONS: Current Outpatient Medications on File Prior to Visit  Medication Sig Dispense Refill  . aspirin  EC 81 MG EC tablet Take 1 tablet (81 mg total) by mouth daily. 90 tablet 0  . atorvastatin (LIPITOR) 80 MG tablet Take 1 tablet (80 mg total) by mouth daily. 90 tablet 1  . buPROPion (ZYBAN) 150 MG 12 hr tablet Take 150 mg by mouth 2 (two) times daily.    . fluticasone (FLONASE) 50 MCG/ACT nasal spray Place 2 sprays into both nostrils daily. 16 g 6  . metoprolol tartrate (LOPRESSOR) 25 MG tablet Take 0.5 tablets (12.5 mg total) by mouth 2 (two) times daily. 90 tablet 0  . nitroGLYCERIN (NITROSTAT) 0.4 MG SL tablet PLACE 1 TABLET UNDER THE TONGUE EVERY 5 MINUTES X 3 DOSES AS NEEDED FOR CHEST PAIN 25 tablet 6  . sertraline (ZOLOFT) 50 MG tablet Take 1/2 tab daily for 1 week then take 1 tab daily (Patient taking differently: Take 50 mg by mouth daily. ) 30 tablet 3  . ticagrelor (BRILINTA) 90 MG TABS tablet Take 1 tablet (90 mg total) by mouth 2 (two) times daily. 180 tablet 2  . Vitamin D, Ergocalciferol, (DRISDOL) 1.25 MG (50000 UNIT) CAPS capsule Take 1 capsule (50,000 Units total) by mouth every 7 (seven) days. 16 capsule 0   No current facility-administered medications on file prior to visit.    ALLERGIES: Allergies  Allergen Reactions  . Pork-Derived Products Anaphylaxis  . Sulfa Antibiotics Nausea And Vomiting    FAMILY HISTORY: Family History  Problem Relation Age of Onset  . Sudden Cardiac Death Maternal Grandfather    ***.  SOCIAL HISTORY: Social History   Socioeconomic History  .  Marital status: Single    Spouse name: Not on file  . Number of children: Not on file  . Years of education: Not on file  . Highest education level: Not on file  Occupational History  . Not on file  Tobacco Use  . Smoking status: Current Every Day Smoker    Packs/day: 1.00    Types: Cigarettes  . Smokeless tobacco: Never Used  Substance and Sexual Activity  . Alcohol use: Never  . Drug use: Yes    Types: Marijuana  . Sexual activity: Yes  Other Topics Concern  . Not on file  Social  History Narrative  . Not on file   Social Determinants of Health   Financial Resource Strain:   . Difficulty of Paying Living Expenses: Not on file  Food Insecurity:   . Worried About Programme researcher, broadcasting/film/video in the Last Year: Not on file  . Ran Out of Food in the Last Year: Not on file  Transportation Needs:   . Lack of Transportation (Medical): Not on file  . Lack of Transportation (Non-Medical): Not on file  Physical Activity:   . Days of Exercise per Week: Not on file  . Minutes of Exercise per Session: Not on file  Stress:   . Feeling of Stress : Not on file  Social Connections:   . Frequency of Communication with Friends and Family: Not on file  . Frequency of Social Gatherings with Friends and Family: Not on file  . Attends Religious Services: Not on file  . Active Member of Clubs or Organizations: Not on file  . Attends Banker Meetings: Not on file  . Marital Status: Not on file  Intimate Partner Violence:   . Fear of Current or Ex-Partner: Not on file  . Emotionally Abused: Not on file  . Physically Abused: Not on file  . Sexually Abused: Not on file    Objective:  *** General: No acute distress.  Patient appears well-groomed.   Head:  Normocephalic/atraumatic Eyes:  fundi examined but not visualized Neck: supple, no paraspinal tenderness, full range of motion Back: No paraspinal tenderness Heart: regular rate and rhythm Lungs: Clear to auscultation bilaterally. Vascular: No carotid bruits. Neurological Exam: Mental status: alert and oriented to person, place, and time, recent and remote memory intact, fund of knowledge intact, attention and concentration intact, speech fluent and not dysarthric, language intact. Cranial nerves: CN I: not tested CN II: pupils equal, round and reactive to light, visual fields intact CN III, IV, VI:  full range of motion, no nystagmus, no ptosis CN V: facial sensation intact. CN VII: upper and lower face symmetric CN  VIII: hearing intact CN IX, X: gag intact, uvula midline CN XI: sternocleidomastoid and trapezius muscles intact CN XII: tongue midline Bulk & Tone: normal, no fasciculations. Motor:  muscle strength 5/5 throughout Sensation:  Pinprick, temperature and vibratory sensation intact. Deep Tendon Reflexes:  2+ throughout,  toes downgoing.   Finger to nose testing:  Without dysmetria.   Heel to shin:  Without dysmetria.   Gait:  Normal station and stride.  Romberg negative.  Assessment/Plan:   ***    Thank you for allowing me to take part in the care of this patient.  Shon Millet, DO  CC: ***

## 2019-12-29 ENCOUNTER — Ambulatory Visit: Payer: Medicaid Other | Admitting: Neurology

## 2020-01-17 NOTE — Progress Notes (Deleted)
NEUROLOGY CONSULTATION NOTE  Grace Castillo MRN: [id number] DOB: 07-Aug-1980  Referring provider: Georgian Co, PA-C Primary care provider: Georgian Co, PA-C  Reason for consult:  Multiple sclerosis   Subjective:  Grace Castillo is a 39 year old ***-handed female with CAD s/p NSTEMI, NSVT, HTN, HLD and tobacco abuse who presents for multiple sclerosis.  History supplemented by hospital, cardiology and PCP notes.  ***  She had a NSTEMI in May and underwent angioplasty and stent DES to LAD and LCx.  She is currently on ***     PAST MEDICAL HISTORY: Past Medical History:  Diagnosis Date  . Hyperlipidemia   . Hypertension   . MS (multiple sclerosis) (HCC)   . NSTEMI (non-ST elevated myocardial infarction) (HCC)   . NSVT (nonsustained ventricular tachycardia) (HCC) 07/02/2019  . S/P angioplasty with stent    DES to LAD and Lcx     PAST SURGICAL HISTORY: Past Surgical History:  Procedure Laterality Date  . CHOLECYSTECTOMY    . CORONARY BALLOON ANGIOPLASTY N/A 07/03/2019   Procedure: CORONARY BALLOON ANGIOPLASTY;  Surgeon: Yvonne Kendall, MD;  [facility];  Service: Cardiovascular;  Laterality: N/A;  distal lad  . CORONARY STENT INTERVENTION N/A 07/03/2019   Procedure: CORONARY STENT INTERVENTION;  Surgeon: Yvonne Kendall, MD;  [facility];  Service: Cardiovascular;  Laterality: N/A;  cfx lad   . INTRAVASCULAR ULTRASOUND/IVUS N/A 07/03/2019   Procedure: Intravascular Ultrasound/IVUS;  Surgeon: Yvonne Kendall, MD;  [facility];  Service: Cardiovascular;  Laterality: N/A;  . LEFT HEART CATH AND CORONARY ANGIOGRAPHY N/A 07/03/2019   Procedure: LEFT HEART CATH AND CORONARY ANGIOGRAPHY;  Surgeon: Yvonne Kendall, MD;  [facility];  Service: Cardiovascular;  Laterality: N/A;  . TUBAL LIGATION      MEDICATIONS: Current Outpatient Medications on File Prior to Visit  Medication Sig Dispense Refill  . aspirin  EC 81 MG EC tablet Take 1 tablet (81 mg total) by mouth daily. 90 tablet 0  . atorvastatin (LIPITOR) 80 MG tablet Take 1 tablet (80 mg total) by mouth daily. 90 tablet 1  . buPROPion (ZYBAN) 150 MG 12 hr tablet Take 150 mg by mouth 2 (two) times daily.    . fluticasone (FLONASE) 50 MCG/ACT nasal spray Place 2 sprays into both nostrils daily. 16 g 6  . metoprolol tartrate (LOPRESSOR) 25 MG tablet Take 0.5 tablets (12.5 mg total) by mouth 2 (two) times daily. 90 tablet 0  . nitroGLYCERIN (NITROSTAT) 0.4 MG SL tablet PLACE 1 TABLET UNDER THE TONGUE EVERY 5 MINUTES X 3 DOSES AS NEEDED FOR CHEST PAIN 25 tablet 6  . sertraline (ZOLOFT) 50 MG tablet Take 1/2 tab daily for 1 week then take 1 tab daily (Patient taking differently: Take 50 mg by mouth daily. ) 30 tablet 3  . ticagrelor (BRILINTA) 90 MG TABS tablet Take 1 tablet (90 mg total) by mouth 2 (two) times daily. 180 tablet 2  . Vitamin D, Ergocalciferol, (DRISDOL) 1.25 MG (50000 UNIT) CAPS capsule TAKE 1 CAPSULE BY MOUTH EVERY 7 DAYS 16 capsule 0   No current facility-administered medications on file prior to visit.    ALLERGIES: Allergies  Allergen Reactions  . Pork-Derived Products Anaphylaxis  . Sulfa Antibiotics Nausea And Vomiting    FAMILY HISTORY: Family History  Problem Relation Age of Onset  . Sudden Cardiac Death Maternal Grandfather    SOCIAL HISTORY: Social History   Socioeconomic History  . Marital status: Single  Spouse name: Not on file  . Number of children: Not on file  . Years of education: Not on file  . Highest education level: Not on file  Occupational History  . Not on file  Tobacco Use  . Smoking status: Current Every Day Smoker    Packs/day: 1.00    Types: Cigarettes  . Smokeless tobacco: Never Used  Substance and Sexual Activity  . Alcohol use: Never  . Drug use: Yes    Types: Marijuana  . Sexual activity: Yes  Other Topics Concern  . Not on file  Social History Narrative  . Not on file    Social Determinants of Health   Financial Resource Strain:   . Difficulty of Paying Living Expenses: Not on file  Food Insecurity:   . Worried About Programme researcher, broadcasting/film/video in the Last Year: Not on file  . Ran Out of Food in the Last Year: Not on file  Transportation Needs:   . Lack of Transportation (Medical): Not on file  . Lack of Transportation (Non-Medical): Not on file  Physical Activity:   . Days of Exercise per Week: Not on file  . Minutes of Exercise per Session: Not on file  Stress:   . Feeling of Stress : Not on file  Social Connections:   . Frequency of Communication with Friends and Family: Not on file  . Frequency of Social Gatherings with Friends and Family: Not on file  . Attends Religious Services: Not on file  . Active Member of Clubs or Organizations: Not on file  . Attends Banker Meetings: Not on file  . Marital Status: Not on file  Intimate Partner Violence:   . Fear of Current or Ex-Partner: Not on file  . Emotionally Abused: Not on file  . Physically Abused: Not on file  . Sexually Abused: Not on file    Objective:  *** General: No acute distress.  Patient appears well-groomed.   Head:  Normocephalic/atraumatic Eyes:  fundi examined but not visualized Neck: supple, no paraspinal tenderness, full range of motion Back: No paraspinal tenderness Heart: regular rate and rhythm Lungs: Clear to auscultation bilaterally. Vascular: No carotid bruits. Neurological Exam: Mental status: alert and oriented to person, place, and time, recent and remote memory intact, fund of knowledge intact, attention and concentration intact, speech fluent and not dysarthric, language intact. Cranial nerves: CN I: not tested CN II: pupils equal, round and reactive to light, visual fields intact CN III, IV, VI:  full range of motion, no nystagmus, no ptosis CN V: facial sensation intact. CN VII: upper and lower face symmetric CN VIII: hearing intact CN IX, X:  gag intact, uvula midline CN XI: sternocleidomastoid and trapezius muscles intact CN XII: tongue midline Bulk & Tone: normal, no fasciculations. Motor:  muscle strength 5/5 throughout Sensation:  Pinprick, temperature and vibratory sensation intact. Deep Tendon Reflexes:  2+ throughout,  toes downgoing.   Finger to nose testing:  Without dysmetria.   Heel to shin:  Without dysmetria.   Gait:  Normal station and stride.  Romberg negative.  Assessment/Plan:   ***    Thank you for allowing me to take part in the care of this patient.  Shon Millet, DO  CC:  Georgian Co, PA-C

## 2020-01-19 ENCOUNTER — Ambulatory Visit: Payer: Medicaid Other | Admitting: Neurology

## 2020-02-22 ENCOUNTER — Other Ambulatory Visit: Payer: Self-pay | Admitting: Cardiology

## 2020-05-05 ENCOUNTER — Other Ambulatory Visit: Payer: Self-pay | Admitting: Cardiology

## 2020-08-09 ENCOUNTER — Other Ambulatory Visit: Payer: Self-pay

## 2020-08-09 ENCOUNTER — Encounter: Payer: Self-pay | Admitting: Family

## 2020-08-09 ENCOUNTER — Ambulatory Visit: Payer: Medicaid Other | Admitting: Family

## 2020-08-09 VITALS — BP 130/80 | HR 92 | Ht 62.5 in | Wt 174.0 lb

## 2020-08-09 DIAGNOSIS — I4729 Other ventricular tachycardia: Secondary | ICD-10-CM

## 2020-08-09 DIAGNOSIS — Z720 Tobacco use: Secondary | ICD-10-CM

## 2020-08-09 DIAGNOSIS — R002 Palpitations: Secondary | ICD-10-CM

## 2020-08-09 DIAGNOSIS — E785 Hyperlipidemia, unspecified: Secondary | ICD-10-CM | POA: Diagnosis not present

## 2020-08-09 DIAGNOSIS — I472 Ventricular tachycardia: Secondary | ICD-10-CM | POA: Diagnosis not present

## 2020-08-09 DIAGNOSIS — I25118 Atherosclerotic heart disease of native coronary artery with other forms of angina pectoris: Secondary | ICD-10-CM | POA: Diagnosis not present

## 2020-08-09 DIAGNOSIS — Z79899 Other long term (current) drug therapy: Secondary | ICD-10-CM

## 2020-08-09 MED ORDER — ATORVASTATIN CALCIUM 80 MG PO TABS: 1.0000 | ORAL_TABLET | Freq: Every day | ORAL | 3 refills | Status: DC

## 2020-08-09 MED ORDER — NITROGLYCERIN 0.4 MG SL SUBL: 0.4000 mg | SUBLINGUAL_TABLET | SUBLINGUAL | 6 refills | Status: DC | PRN

## 2020-08-09 MED ORDER — METOPROLOL TARTRATE 25 MG PO TABS: 25.0000 mg | ORAL_TABLET | Freq: Two times a day (BID) | ORAL | 3 refills | Status: DC

## 2020-08-09 MED ORDER — TICAGRELOR 60 MG PO TABS: 60.0000 mg | ORAL_TABLET | Freq: Two times a day (BID) | ORAL | 3 refills | Status: DC

## 2020-08-09 NOTE — Progress Notes (Addendum)
Office Visit    Patient Name: Grace Castillo Date of Encounter: 08/09/2020  PCP:  Marcine Matar, MD   [facility]  Cardiologist:  Donato Schultz, MD  Advanced Practice Provider:  No care team member to display Electrophysiologist:  None    Chief Complaint    Grace Castillo is a 40 y.o. female with a hx of CAD with and STEMI and stent of LAD, LCx and bifurcation of distal LM 06/2019, ventricular tachycardia, tobacco use presents today for chest pain.   Past Medical History    Past Medical History:  Diagnosis Date   Hyperlipidemia    Hypertension    MS (multiple sclerosis) (HCC)    NSTEMI (non-ST elevated myocardial infarction) (HCC)    NSVT (nonsustained ventricular tachycardia) (HCC) 07/02/2019   S/P angioplasty with stent    DES to LAD and Lcx    Past Surgical History:  Procedure Laterality Date   CHOLECYSTECTOMY     CORONARY BALLOON ANGIOPLASTY N/A 07/03/2019   Procedure: CORONARY BALLOON ANGIOPLASTY;  Surgeon: Yvonne Kendall, MD;  Location: [facility];  Service: Cardiovascular;  Laterality: N/A;  distal lad   CORONARY STENT INTERVENTION N/A 07/03/2019   Procedure: CORONARY STENT INTERVENTION;  Surgeon: Yvonne Kendall, MD;  Location: [facility];  Service: Cardiovascular;  Laterality: N/A;  cfx lad    INTRAVASCULAR ULTRASOUND/IVUS N/A 07/03/2019   Procedure: Intravascular Ultrasound/IVUS;  Surgeon: Yvonne Kendall, MD;  Location: [facility];  Service: Cardiovascular;  Laterality: N/A;   LEFT HEART CATH AND CORONARY ANGIOGRAPHY N/A 07/03/2019   Procedure: LEFT HEART CATH AND CORONARY ANGIOGRAPHY;  Surgeon: Yvonne Kendall, MD;  Location: [facility];  Service: Cardiovascular;  Laterality: N/A;   TUBAL LIGATION      Allergies  Allergies  Allergen Reactions   Pork-Derived Products Anaphylaxis   Sulfa Antibiotics Nausea And Vomiting    History of Present Illness    Grace Castillo is a 40 y.o. female with a hx  of CAD with and STEMI and stent of LAD, LCx and bifurcation of distal LM 06/2019, ventricular tachycardia, tobacco use, multiple sclerosis, depression last seen 12/08/19.  May 2021 she had chest pain and pressure and was evaluated in the emergency department during ventricular tachycardia which converted with IV amiodarone.  Cardiac catheterization showed proximal left circumflex 9% stenosis and diffuse proximal LAD disease.  IVIS PCI to circumflex with thrombus, plaque shift requiring bifurcation stenting of the distal left main to the LAD and left circumflex.  The occlusive thrombus into the mid LAD which was treated with balloon angioplasty.  She was last seen 12/08/2019 with rare fleeting chest discomfort and otherwise doing well.  No changes were made at that time.  She presents today for follow-up. Tells me she will have episodes of chest pain that she is not sure if these episodes are anxiety or angina.. These episodes occur more often at rest.  Most recent episode lasted 20 minutes. She took 2 nitroglycerin and symptoms resolved.    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  She takes care of her 65 and 68 year old niece and nephew. Tells me she gets noticeably wornout. Her feet are swelling more - she feels like she has "fluid" in her feet. No appreciable edema on exam. Does spend most of the day on her feet and outside in the heat. Notes she feels more worn out than ususal over the last 1.5 months. She ran  out of Vitamin D1 month ago. Stopped her Metoprolol last week as she ran out. Thinks she last took Brilinta 2 months ago.  EKGs/Labs/Other Studies Reviewed:   The following studies were reviewed today:  EKG:  EKG is ordered today.  The ekg ordered today demonstrates NSR 92 bpm with no acute ST/T wave changes.   Recent Labs: 08/16/2019: BUN 10; Creatinine, Ser 0.74; Magnesium 1.9; Potassium 4.6; Sodium 139; TSH 0.938 09/01/2019: ALT 25  Recent Lipid Panel     Component Value Date/Time   CHOL 114 09/01/2019 1029   TRIG 80 09/01/2019 1029   HDL 33 (L) 09/01/2019 1029   CHOLHDL 3.5 09/01/2019 1029   CHOLHDL 6.1 07/04/2019 0537   VLDL 17 07/04/2019 0537   LDLCALC 65 09/01/2019 1029   Home Medications   Current Meds  Medication Sig   aspirin EC 81 MG EC tablet Take 1 tablet (81 mg total) by mouth daily.   atorvastatin (LIPITOR) 80 MG tablet TAKE 1 TABLET BY MOUTH EVERY DAY   fluticasone (FLONASE) 50 MCG/ACT nasal spray Place 2 sprays into both nostrils daily.   nitroGLYCERIN (NITROSTAT) 0.4 MG SL tablet PLACE 1 TABLET UNDER THE TONGUE EVERY 5 MINUTES X 3 DOSES AS NEEDED FOR CHEST PAIN   sertraline (ZOLOFT) 50 MG tablet Take 1/2 tab daily for 1 week then take 1 tab daily   Vitamin D, Ergocalciferol, (DRISDOL) 1.25 MG (50000 UNIT) CAPS capsule TAKE 1 CAPSULE BY MOUTH EVERY 7 DAYS   [DISCONTINUED] BRILINTA 90 MG TABS tablet TAKE 1 TABLET BY MOUTH TWICE DAILY   [DISCONTINUED] metoprolol tartrate (LOPRESSOR) 25 MG tablet TAKE 1/2 TABLET BY MOUTH TWICE DAILY     Review of Systems   All other systems reviewed and are otherwise negative except as noted above.  Physical Exam    VS:  BP 130/80 (BP Location: Left Arm, Patient Position: Sitting, Cuff Size: Normal)   Pulse 92   Ht 5' 2.5" (1.588 m)   Wt 174 lb (78.9 kg)   SpO2 96%   BMI 31.32 kg/m  , BMI Body mass index is 31.32 kg/m.  Wt Readings from Last 3 Encounters:  08/09/20 174 lb (78.9 kg)  12/08/19 166 lb (75.3 kg)  08/17/19 157 lb (71.2 kg)     GEN: Well nourished, well developed, in no acute distress. HEENT: normal. Neck: Supple, no JVD, carotid bruits, or masses. Cardiac: RRR, no murmurs, rubs, or gallops. No clubbing, cyanosis, edema.  Radials/DP/PT 2+ and equal bilaterally.  Respiratory:  Respirations regular and unlabored, clear to auscultation bilaterally. GI: Soft, nontender, nondistended. MS: No deformity or atrophy. Skin: Warm and dry, no rash. Neuro:  Strength and  sensation are intact. Psych: Normal affect.  Assessment & Plan    CAD / DOE / Palpitations- NSTEMI with intervention 06/2019.  Notes dyspnea on exertion as well as chest pain. Stopped Brilinta approximately 2 months ago.  Unclear if she completed 1 year of therapy.  She is now 1 year from intervention, will resume Brilinta but reduce to 60 mg twice daily dose.  Plan for Lexiscan Myoview to rule out ischemia.  Notes occasional palpitations, will increase metoprolol 25 mg twice daily. Heart healthy diet and regular cardiovascular exercise encouraged. CBC, CMP today to rule out anemia or electrolytes abnormality as contributory.   Shared Decision Making/Informed Consent{ The risks [chest pain, shortness of breath, cardiac arrhythmias, dizziness, blood pressure fluctuations, myocardial infarction, stroke/transient ischemic attack, nausea, vomiting, allergic reaction, radiation exposure, metallic taste sensation and life-threatening complications (estimated  to be 1 in 10,000)], benefits (risk stratification, diagnosing coronary artery disease, treatment guidance) and alternatives of a nuclear stress test were discussed in detail with Ms. Leven and she agrees to proceed.   HLD - Refill Atorvastatin 80mg  daily. Lipid panel.   Medication management-.  The importance of taking her medications regularly in the setting of coronary artery disease.  LE edema -not appreciable on exam.  Anticipate etiology venous insufficiency.  Educated to maintain low-salt diet, elevate lower extremities, wear compression stockings when possible.  Nonsustained VT - in setting of NSTEMI. Previously converted with IV Amiodarone. No evidence of recurrence.   Tobacco use - Smoking cessation encouraged. Recommend utilization of [id number]. Did not like Wellbutrin as it made her gain weight.   MS - Follows with neurology.  Recurrent sinus infection - Continue to follow with PCP.  Depression - Continue to follow with  PCP.   Disposition: Follow up in 2 month(s) with Dr. or APP   Signed, Anne Fu, NP 08/09/2020, 3:37 PM [facility]

## 2020-08-09 NOTE — Patient Instructions (Addendum)
Medication Instructions:  Your physician has recommended you make the following change in your medication:  CHANGE Metoprolol to 25mg  one tablet twice daily  CHANGE Brilinta to 60mg  twice daily  RESUME Atorvastatin 80mg  daily  *If you need a refill on your cardiac medications before your next appointment, please call your pharmacy*   Lab Work: Your physician recommends that you return for lab work today: CBC, CMP, lipid panel  If you have labs (blood work) drawn today and your tests are completely normal, you will receive your results only by: MyChart Message (if you have MyChart) OR A paper copy in the mail If you have any lab test that is abnormal or we need to change your treatment, we will call you to review the results.   Testing/Procedures: Your physician has requested that you have a lexiscan myoview.  Please follow instruction sheet, as given.  Follow-Up: At [facility], you and your health needs are our priority.  As part of our continuing mission to provide you with exceptional heart care, we have created designated Provider Care Teams.  These Care Teams include your primary Cardiologist (physician) and Advanced Practice Providers (APPs -  Physician Assistants and Nurse Practitioners) who all work together to provide you with the care you need, when you need it.  We recommend signing up for the patient portal called "MyChart".  Sign up information is provided on this After Visit Summary.  MyChart is used to connect with patients for Virtual Visits (Telemedicine).  Patients are able to view lab/test results, encounter notes, upcoming appointments, etc.  Non-urgent messages can be sent to your provider as well.   To learn more about what you can do with MyChart, go to .    Your next appointment:   2 month(s)  The format for your next appointment:   In Person  Provider:   You may see , MD or one of the following Advanced Practice  Providers on your designated Care Team:   [facility], NP   Other Instructions  To prevent or reduce lower extremity swelling: Eat a low salt diet. Salt makes the body hold onto extra fluid which causes swelling. Sit with legs elevated. For example, in the recliner or on an ottoman.  Wear knee-high compression stockings during the daytime. Ones labeled 15-20 mmHg provide good compression.  Heart Healthy Diet Recommendations: A low-salt diet is recommended. Meats should be grilled, baked, or boiled. Avoid fried foods. Focus on lean protein sources like fish or chicken with vegetables and fruits. The American Heart Association is a [employer]!  American Heart Association Diet and Lifeystyle Recommendations   Exercise recommendations: The American Heart Association recommends 150 minutes of moderate intensity exercise weekly. Try 30 minutes of moderate intensity exercise 4-5 times per week. This could include walking, jogging, or swimming.

## 2020-08-10 LAB — CBC
Hematocrit: 42 % (ref 34.0–46.6)
Hemoglobin: 14.4 g/dL (ref 11.1–15.9)
MCH: 32.8 pg (ref 26.6–33.0)
MCHC: 34.3 g/dL (ref 31.5–35.7)
MCV: 96 fL (ref 79–97)
Platelets: 284 10*3/uL (ref 150–450)
RBC: 4.39 x10E6/uL (ref 3.77–5.28)
RDW: 12.8 % (ref 11.7–15.4)
WBC: 10.5 10*3/uL (ref 3.4–10.8)

## 2020-08-10 LAB — COMPREHENSIVE METABOLIC PANEL
ALT: 20 IU/L (ref 0–32)
AST: 20 IU/L (ref 0–40)
Albumin/Globulin Ratio: 1.6 (ref 1.2–2.2)
Albumin: 4.4 g/dL (ref 3.8–4.8)
Alkaline Phosphatase: 76 IU/L (ref 44–121)
BUN/Creatinine Ratio: 13 (ref 9–23)
BUN: 10 mg/dL (ref 6–20)
Bilirubin Total: 0.4 mg/dL (ref 0.0–1.2)
CO2: 22 mmol/L (ref 20–29)
Calcium: 9.7 mg/dL (ref 8.7–10.2)
Chloride: 103 mmol/L (ref 96–106)
Creatinine, Ser: 0.75 mg/dL (ref 0.57–1.00)
Globulin, Total: 2.8 g/dL (ref 1.5–4.5)
Glucose: 92 mg/dL (ref 65–99)
Potassium: 4.3 mmol/L (ref 3.5–5.2)
Sodium: 141 mmol/L (ref 134–144)
Total Protein: 7.2 g/dL (ref 6.0–8.5)
eGFR: 104 mL/min/{1.73_m2} (ref >59)

## 2020-08-10 LAB — LIPID PANEL
Chol/HDL Ratio: 6.9 ratio — ABNORMAL HIGH (ref 0.0–4.4)
Cholesterol, Total: 227 mg/dL — ABNORMAL HIGH (ref 100–199)
HDL: 33 mg/dL — ABNORMAL LOW (ref >39)
LDL Chol Calc (NIH): 166 mg/dL — ABNORMAL HIGH (ref 0–99)
Triglycerides: 153 mg/dL — ABNORMAL HIGH (ref 0–149)
VLDL Cholesterol Cal: 28 mg/dL (ref 5–40)

## 2020-08-12 ENCOUNTER — Telehealth: Payer: Self-pay

## 2020-08-12 DIAGNOSIS — E785 Hyperlipidemia, unspecified: Secondary | ICD-10-CM

## 2020-08-12 MED ORDER — EZETIMIBE 10 MG PO TABS: 10.0000 mg | ORAL_TABLET | Freq: Every day | ORAL | 3 refills | Status: DC

## 2020-08-12 NOTE — Telephone Encounter (Signed)
-----   Message from Alver Sorrow, NP sent at 08/11/2020  5:36 PM EDT ----- Normal liver and kidney function. Normal electrolytes. CBC with no evidence of anemia nor infection. Good result! Cholesterol panel markedly elevated. Atorvastatin was resumed at recent clinic visit - anticipate she was not taking regularly - recommend Atorvastatin 80mg  daily as prescribed and repeat lipid panel in 6 weeks.   If she was taking Atorvastatin regularly, add Zetia 10mg  daily with repeat lipid panel 6 weeks.

## 2020-08-12 NOTE — Telephone Encounter (Signed)
Spoke with pt regarding lab results and recommendations. She agrees to take her atorvastatin QD and begin Zetia as well. Lab appt has been made.

## 2020-08-13 ENCOUNTER — Other Ambulatory Visit: Payer: Self-pay

## 2020-08-13 ENCOUNTER — Ambulatory Visit: Payer: Medicaid Other | Attending: Internal Medicine | Admitting: Internal Medicine

## 2020-08-13 DIAGNOSIS — E669 Obesity, unspecified: Secondary | ICD-10-CM | POA: Diagnosis not present

## 2020-08-13 DIAGNOSIS — R0981 Nasal congestion: Secondary | ICD-10-CM

## 2020-08-13 NOTE — Progress Notes (Signed)
Patient ID: Grace Castillo, female   DOB: 10-30-1980, 40 y.o.   MRN: [id number] Virtual Visit via Telephone Note  I connected with Donne Anon on 08/13/2020 at 9:51 a.m by telephone and verified that I am speaking with the correct person using two identifiers  Location: Patient: home Provider: office  Participants: Myself Patient CMA: Ms. Julius Bowels    I discussed the limitations, risks, security and privacy concerns of performing an evaluation and management service by telephone and the availability of in person appointments. I also discussed with the patient that there may be a patient responsible charge related to this service. The patient expressed understanding and agreed to proceed.   History of Present Illness: Pt with hx of MS, HTN, tob dep, CAD (NSTEMI 07/2019), HL.  Last seen by PA 07/2019.  Patient has 2 concerns today. She complains of issues with her sinuses.  She states that her nose has been stopped up and she is having drainage at the back of the throat for the past 1-1/2 weeks.  She endorses pressure over the right maxillary sinus and frontal headache..  She brings up brown phlegm in the mornings.  No shortness of breath, fever, loss of taste or smell or itchy throat.  She has had some watery eyes and itchy eyes.  Last week she had a feeling of popping sensation in both ears.  She has been using Flonase nasal spray.  Her other concern is her weight.  She states that she has gained about 30 pounds since she had a heart attack in May of last year.  She is not sure why she continues to gain weight.  She walks every day for about 25 minutes.  She is not a big eater but states that she does love pasta and bread.  She does not eat too much meat and certainly not fried meat.  She has never seen a nutritionist.   Outpatient Encounter Medications as of 08/13/2020  Medication Sig   aspirin EC 81 MG EC tablet Take 1 tablet (81 mg total) by mouth daily.   atorvastatin (LIPITOR) 80 MG tablet  Take 1 tablet (80 mg total) by mouth daily.   ezetimibe (ZETIA) 10 MG tablet Take 1 tablet (10 mg total) by mouth daily.   fluticasone (FLONASE) 50 MCG/ACT nasal spray Place 2 sprays into both nostrils daily.   metoprolol tartrate (LOPRESSOR) 25 MG tablet Take 1 tablet (25 mg total) by mouth 2 (two) times daily.   nitroGLYCERIN (NITROSTAT) 0.4 MG SL tablet Place 1 tablet (0.4 mg total) under the tongue every 5 (five) minutes as needed for chest pain.   sertraline (ZOLOFT) 50 MG tablet Take 1/2 tab daily for 1 week then take 1 tab daily   ticagrelor (BRILINTA) 60 MG TABS tablet Take 1 tablet (60 mg total) by mouth 2 (two) times daily.   Vitamin D, Ergocalciferol, (DRISDOL) 1.25 MG (50000 UNIT) CAPS capsule TAKE 1 CAPSULE BY MOUTH EVERY 7 DAYS   No facility-administered encounter medications on file as of 08/13/2020.      Observations/Objective: Depression screen [facility] 2/9 08/13/2020 08/16/2019  Decreased Interest 0 1  Down, Depressed, Hopeless 0 1  PHQ - 2 Score 0 2  Altered sleeping 3 1  Tired, decreased energy 3 1  Change in appetite 1 1  Feeling bad or failure about yourself  1 1  Trouble concentrating 1 1  Moving slowly or fidgety/restless 0 0  Suicidal thoughts 0 0  PHQ-9 Score 9 7   GAD  7 : Generalized Anxiety Score 08/13/2020 08/16/2019  Nervous, Anxious, on Edge 3 1  Control/stop worrying 1 1  Worry too much - different things 3 1  Trouble relaxing 3 1  Restless 3 1  Easily annoyed or irritable 3 1  Afraid - awful might happen 0 1  Total GAD 7 Score 16 7    Assessment and Plan: 1. Nasal sinus congestion Continue Flonase.  I recommend using an over-the-counter allergy medication like Claritin, Zyrtec or Allegra.  She states that she has some Claritin at home and will start using it.  Follow-up if no improvement.  2. Obesity (BMI 30.0-34.9) Discussed the importance of healthy eating habits and regular exercise to achieve and maintain weight loss.  Advised patient that about  70% of weight loss has to do with eating habits.  Advised cutting back on portion sizes of white carbohydrates, eliminating sugary drinks from the diet and incorporating fresh fruits and vegetables into the diet daily.  She is agreeable to seeing a nutritionist.  Continue regular exercise daily as she has been doing. - Amb ref to Medical Nutrition Therapy-MNT   Follow Up Instructions: As needed   I discussed the assessment and treatment plan with the patient. The patient was provided an opportunity to ask questions and all were answered. The patient agreed with the plan and demonstrated an understanding of the instructions.   The patient was advised to call back or seek an in-person evaluation if the symptoms worsen or if the condition fails to improve as anticipated.  I  Spent 12 minutes on this telephone encounter  Jonah Blue, MD

## 2020-08-13 NOTE — Addendum Note (Signed)
Addended by: Alver Sorrow on: 08/13/2020 01:33 PM   Modules accepted: Orders

## 2020-09-02 ENCOUNTER — Telehealth (HOSPITAL_COMMUNITY): Payer: Self-pay | Admitting: *Deleted

## 2020-09-02 NOTE — Telephone Encounter (Signed)
Patient given detailed instructions per Myocardial Perfusion Study Information Sheet for the test on 09/06/20 Patient notified to arrive 15 minutes early and that it is imperative to arrive on time for appointment to keep from having the test rescheduled.  If you need to cancel or reschedule your appointment, please call the office within 24 hours of your appointment. . Patient verbalized understanding. Josely Moffat Jacqueline   

## 2020-09-06 ENCOUNTER — Other Ambulatory Visit: Payer: Self-pay

## 2020-09-06 ENCOUNTER — Ambulatory Visit (HOSPITAL_COMMUNITY): Payer: Medicaid Other | Attending: Family

## 2020-09-06 VITALS — Ht 62.5 in | Wt 174.0 lb

## 2020-09-06 DIAGNOSIS — R110 Nausea: Secondary | ICD-10-CM | POA: Insufficient documentation

## 2020-09-06 DIAGNOSIS — I25118 Atherosclerotic heart disease of native coronary artery with other forms of angina pectoris: Secondary | ICD-10-CM | POA: Diagnosis present

## 2020-09-06 LAB — MYOCARDIAL PERFUSION IMAGING
LV dias vol: 72 mL (ref 46–106)
LV sys vol: 29 mL
Peak HR: 108 {beats}/min
Rest HR: 65 {beats}/min
SDS: 1
SRS: 2
SSS: 3
TID: 0.97

## 2020-09-06 MED ORDER — TECHNETIUM TC 99M TETROFOSMIN IV KIT
10.2000 | PACK | Freq: Once | INTRAVENOUS | Status: AC | PRN
  Administered 2020-09-06: 10.2 via INTRAVENOUS
  Filled 2020-09-06: qty 11

## 2020-09-06 MED ORDER — AMINOPHYLLINE 25 MG/ML IV SOLN
150.0000 mg | Freq: Once | INTRAVENOUS | Status: AC
  Administered 2020-09-06: 150 mg via INTRAVENOUS

## 2020-09-06 MED ORDER — TECHNETIUM TC 99M TETROFOSMIN IV KIT
31.6000 | PACK | Freq: Once | INTRAVENOUS | Status: AC | PRN
  Administered 2020-09-06: 31.6 via INTRAVENOUS
  Filled 2020-09-06: qty 32

## 2020-09-06 MED ORDER — REGADENOSON 0.4 MG/5ML IV SOLN
0.4000 mg | Freq: Once | INTRAVENOUS | Status: AC
  Administered 2020-09-06: 0.4 mg via INTRAVENOUS

## 2020-09-14 ENCOUNTER — Other Ambulatory Visit: Payer: Self-pay | Admitting: Family Medicine

## 2020-09-14 NOTE — Telephone Encounter (Signed)
Requested medication (s) are due for refill today: yes  Requested medication (s) are on the active medication list: yes  Last refill:  12/29/19  Future visit scheduled: no  Notes to clinic:  med not delegated to NT to RF   Requested Prescriptions  Pending Prescriptions Disp Refills   Vitamin D, Ergocalciferol, (DRISDOL) 1.25 MG (50000 UNIT) CAPS capsule [Pharmacy Med Name: VITAMIN D2 50,000IU (ERGO) CAP RX] 16 capsule 0    Sig: TAKE ONE CAPSULE BY MOUTH EVERY 7 DAYS      Endocrinology:  Vitamins - Vitamin D Supplementation Failed - 09/14/2020  3:11 PM      Failed - 50,000 IU strengths are not delegated      Failed - Phosphate in normal range and within 360 days    No results found for: PHOS        Failed - Vitamin D in normal range and within 360 days    Vit D, 25-Hydroxy  Date Value Ref Range Status  08/16/2019 24.7 (L) 30.0 - 100.0 ng/mL Final    Comment:    Vitamin D deficiency has been defined by the Institute of Medicine and an Endocrine Society practice guideline as a level of serum 25-OH vitamin D less than 20 ng/mL (1,2). The Endocrine Society went on to further define vitamin D insufficiency as a level between 21 and 29 ng/mL (2). 1. IOM (Institute of Medicine). 2010. Dietary reference    intakes for calcium and D. [place]: The    [employer]. 2. Holick MF, Binkley [place], Bischoff-Ferrari HA, et al.    Evaluation, treatment, and prevention of vitamin D    deficiency: an Endocrine Society clinical practice    guideline. JCEM. 2011 Jul; 96(7):1911-30.           Passed - Ca in normal range and within 360 days    Calcium  Date Value Ref Range Status  08/09/2020 9.7 8.7 - 10.2 mg/dL Final          Passed - Valid encounter within last 12 months    Recent Outpatient Visits           1 month ago Nasal sinus congestion   [place] [facility] Marcine Matar, MD   1 year ago Depression, unspecified depression type   [facility] [place], Marzella Schlein, [place]       Future Appointments             In 2 months Anne Fu, Veverly Fells, MD [facility]

## 2020-09-16 ENCOUNTER — Ambulatory Visit: Payer: Medicaid Other | Admitting: Registered"

## 2020-09-18 ENCOUNTER — Other Ambulatory Visit: Payer: Self-pay | Admitting: Physician Assistant

## 2020-09-18 DIAGNOSIS — F32A Depression, unspecified: Secondary | ICD-10-CM

## 2020-09-18 NOTE — Telephone Encounter (Signed)
Requested medication (s) are due for refill today: Yes  Requested medication (s) are on the active medication list: Yes  Last refill:  08/16/19  Future visit scheduled: No  Notes to clinic:  Prescription expired.    Requested Prescriptions  Pending Prescriptions Disp Refills   sertraline (ZOLOFT) 50 MG tablet [Pharmacy Med Name: SERTRALINE 50MG  TABLETS] 30 tablet 3    Sig: TAKE 1/2 TABLET BY MOUTH DAILY FOR 1 WEEK THEN TAKE 1 TABLET BY MOUTH DAILY      Psychiatry:  Antidepressants - SSRI Passed - 09/18/2020 10:00 AM      Passed - Valid encounter within last 6 months    Recent Outpatient Visits           1 month ago Nasal sinus congestion   [place] [facility], MD   1 year ago Depression, unspecified depression type   [facility] [place], [place], Marzella Schlein       Future Appointments             In 2 months [place], Anne Fu, MD [facility]

## 2020-09-27 ENCOUNTER — Other Ambulatory Visit: Payer: Self-pay

## 2020-09-27 ENCOUNTER — Other Ambulatory Visit: Payer: Medicaid Other | Admitting: *Deleted

## 2020-09-27 DIAGNOSIS — E785 Hyperlipidemia, unspecified: Secondary | ICD-10-CM

## 2020-09-27 LAB — LIPID PANEL
Chol/HDL Ratio: 4.2 ratio (ref 0.0–4.4)
Cholesterol, Total: 131 mg/dL (ref 100–199)
HDL: 31 mg/dL — ABNORMAL LOW (ref >39)
LDL Chol Calc (NIH): 85 mg/dL (ref 0–99)
Triglycerides: 74 mg/dL (ref 0–149)
VLDL Cholesterol Cal: 15 mg/dL (ref 5–40)

## 2020-10-28 ENCOUNTER — Ambulatory Visit: Payer: Medicaid Other | Admitting: Registered"

## 2020-11-21 NOTE — Progress Notes (Signed)
Cardiology Office Note:    Date:  11/22/2020   ID:  Grace Castillo, DOB 20-May-1980, MRN [id number]  PCP:  Grace Matar, MD  [facility] Cardiologist:  Grace Schultz, MD  [facility] Electrophysiologist:  None   Referring MD: Grace Matar, MD   History of Present Illness:    Grace Castillo is a 40 y.o. female here for follow-up s/p Lexiscan (08/2020).  Had non-ST elevation myocardial infarction with CAD, stents to the LAD, left circumflex on Brilinta and aspirin.  Seemed to be doing well.  Frustrated by tobacco cessation and weight gain.  It was May 2021 where she had chest pain pressure ventricular tachycardia converted with IV amiodarone and cardiac catheterization that showed proximal left circumflex with 90% stenosis and diffuse proximal LAD disease.  IVUS PCI to circumflex with thrombus, plaque shift requiring bifurcation stenting of the distal left main into the LAD and left circumflex.  Occlusive thrombus went into the mid LAD which was treated with balloon angioplasty.  Peak troponin was 6000.  She was having rare fleeting chest discomfort.  Today: Grace Castillo followed-up with Grace Shields, NP 08/09/2020 and reported DOE and chest pain. A lexiscan myoview was ordered to rule out ischemia. This was completed 09/06/2020 and showed LVEF 60% but otherwise looked normal.  Overall, she is feeling the same, and has "really low" energy. While walking, her right leg gives out on her and she develops shooting pains radiating proximally to her hip.   She also continues to have episodes of sharp chest pain as well. During her pain, she looks at her smart watch and notes her heart rate elevates even while she is sitting.  Since her last visit she has gained significant weight, from 155 to 181 lbs. However, she is active. 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 This also causes her bilateral LE pain.  Her diet consistently includes  coffee.  Of note, if she does not take the Brilinta, she can feel a difference. After taking Brilinta she does feel a bit more winded.  She denies any palpitations. No lightheadedness, headaches, syncope, orthopnea, or PND. Also has no lower extremity edema.   Past Medical History:  Diagnosis Date   Hyperlipidemia    Hypertension    MS (multiple sclerosis) (HCC)    NSTEMI (non-ST elevated myocardial infarction) (HCC)    NSVT (nonsustained ventricular tachycardia) (HCC) 07/02/2019   S/P angioplasty with stent    DES to LAD and Lcx     Past Surgical History:  Procedure Laterality Date   CHOLECYSTECTOMY     CORONARY BALLOON ANGIOPLASTY N/A 07/03/2019   Procedure: CORONARY BALLOON ANGIOPLASTY;  Surgeon: Yvonne Kendall, MD;  Location: [facility] INVASIVE CV LAB;  Service: Cardiovascular;  Laterality: N/A;  distal lad   CORONARY STENT INTERVENTION N/A 07/03/2019   Procedure: CORONARY STENT INTERVENTION;  Surgeon: Yvonne Kendall, MD;  Location: [facility] INVASIVE CV LAB;  Service: Cardiovascular;  Laterality: N/A;  cfx lad    INTRAVASCULAR ULTRASOUND/IVUS N/A 07/03/2019   Procedure: Intravascular Ultrasound/IVUS;  Surgeon: Yvonne Kendall, MD;  Location: [facility] INVASIVE CV LAB;  Service: Cardiovascular;  Laterality: N/A;   LEFT HEART CATH AND CORONARY ANGIOGRAPHY N/A 07/03/2019   Procedure: LEFT HEART CATH AND CORONARY ANGIOGRAPHY;  Surgeon: Yvonne Kendall, MD;  Location: [facility] INVASIVE CV LAB;  Service: Cardiovascular;  Laterality: N/A;   TUBAL LIGATION      Current Medications: Current Meds  Medication Sig   aspirin EC  81 MG EC tablet Take 1 tablet (81 mg total) by mouth daily.   atorvastatin (LIPITOR) 80 MG tablet Take 1 tablet (80 mg total) by mouth daily.   clopidogrel (PLAVIX) 75 MG tablet Take 1 tablet (75 mg total) by mouth daily.   fluticasone (FLONASE) 50 MCG/ACT nasal spray Place 2 sprays into both nostrils daily.   nitroGLYCERIN (NITROSTAT) 0.4 MG SL tablet Place 1 tablet (0.4 mg total)  under the tongue every 5 (five) minutes as needed for chest pain.   sertraline (ZOLOFT) 50 MG tablet Take 1 tablet (50 mg total) by mouth daily.   Vitamin D, Ergocalciferol, (DRISDOL) 1.25 MG (50000 UNIT) CAPS capsule TAKE 1 CAPSULE BY MOUTH EVERY 7 DAYS   [DISCONTINUED] metoprolol tartrate (LOPRESSOR) 25 MG tablet Take 1 tablet (25 mg total) by mouth 2 (two) times daily.   [DISCONTINUED] ticagrelor (BRILINTA) 60 MG TABS tablet Take 1 tablet (60 mg total) by mouth 2 (two) times daily.     Allergies:   Pork-derived products and Sulfa antibiotics   Social History   Socioeconomic History   Marital status: Single    Spouse name: Not on file   Number of children: Not on file   Years of education: Not on file   Highest education level: Not on file  Occupational History   Not on file  Tobacco Use   Smoking status: Every Day    Packs/day: 1.00    Types: Cigarettes   Smokeless tobacco: Never  Substance and Sexual Activity   Alcohol use: Never   Drug use: Yes    Types: Marijuana   Sexual activity: Yes  Other Topics Concern   Not on file  Social History Narrative   Not on file   Social Determinants of Health   Financial Resource Strain: Not on file  Food Insecurity: Not on file  Transportation Needs: Not on file  Physical Activity: Not on file  Stress: Not on file  Social Connections: Not on file     Family History: The patient's family history includes Sudden Cardiac Death in her maternal grandfather.  ROS:   Please see the history of present illness. (+) Shooting pains radiating from right LE to right hip. (+) Chest pain/discomfort with elevated heart rate (+) Weight gain (+) Fatigue (+) Shortness of breath (+) Bilateral LE pain    All other systems reviewed and are negative.  EKGs/Labs/Other Studies Reviewed:    Eugenie Birks Myoview 09/06/2020: The left ventricular ejection fraction is normal (55-65%). Nuclear stress EF: 60%. No T wave inversion was noted during  stress. There was no ST segment deviation noted during stress. The study is normal. This is a low risk study. Normal perfusion. LVEF 60% with normal wall motion. This is a low risk study. No prior for comparison.  Echo 07/04/2019:  1. Left ventricular ejection fraction, by estimation, is 55 to 60%. The  left ventricle has normal function. The left ventricle demonstrates  regional wall motion abnormalities (see scoring diagram/findings for  description). There is mild hypokinesis of  the left ventricular, basal-mid inferolateral wall.   2. The mitral valve is grossly normal. Trivial mitral valve  regurgitation.   3. The aortic valve is tricuspid. Aortic valve regurgitation is not  visualized.   4. The inferior vena cava is dilated in size with <50% respiratory  variability, suggesting right atrial pressure of 15 mmHg.   LHC 07/03/2019: Conclusions: Multivessel coronary artery disease, including mild to moderate diffuse proximal LAD disease, acute plaque rupture with thrombus  involving the ostial/proximal LCx with 90% stenosis, and 40% tubular lesion in the proximal RCA. Mildly elevated left ventricular filling pressure. Complex IVUS guided PCI to the ostial/proximal LCx leading to significant plaque shift and thrombus formation in the ostial LAD requiring bifurcation stenting of the distal LMCA into the LAD and LCx using a crush technique (Resolute Onyx 3.0 x 22 mm drug-eluting stent in the LMCA/LAD and Resolute Onyx 3.5 x 12 mm drug-eluting stent in the LCx).  There was also embolization of occlusive thrombus into the mid LAD, successfully treated with balloon angioplasty.   Recommendations: Continue bivalirudin infusion for 2 hours and tirofiban infusion for 18 hours. Titrate nitroglycerin infusion for relief of chest pain. Dual antiplatelet therapy with aspirin and ticagrelor for at least 12 months, ideally longer in light of concern for hypercoagulable state given significant  intracoronary thrombus formation despite adequate anticoagulation during the procedure. Obtain a limited echo tomorrow to reassess LVEF following complex PCI leading to temporary closure of the LAD and LCx. Aggressive secondary prevention, including high intensity statin therapy and smoking cessation.  Echo 07/03/2019: 1. Left ventricular ejection fraction, by estimation, is 50 to 55%. The  left ventricle has low normal function. The left ventricle has no regional  wall motion abnormalities. Left ventricular diastolic parameters were  normal.   2. Right ventricular systolic function is normal. The right ventricular  size is normal. Tricuspid regurgitation signal is inadequate for assessing  PA pressure.   3. The mitral valve is normal in structure. Trivial mitral valve  regurgitation. No evidence of mitral stenosis.   4. The aortic valve has an indeterminant number of cusps. Aortic valve  regurgitation is not visualized. No aortic stenosis is present.   5. The inferior vena cava is normal in size with greater than 50%  respiratory variability, suggesting right atrial pressure of 3 mmHg.   Comparison(s): No prior Echocardiogram.   Conclusion(s)/Recommendation(s): Low normal EF. There are no clear wall motion abnormalities that are consistent across views, but the  inferior/inferoseptal/inferolateral walls appear to thicken less than the  other walls.   EKG:  EKG is personally reviewed and interpreted. 11/22/2020: EKG was not ordered today.   Recent Labs: 08/09/2020: ALT 20; BUN 10; Creatinine, Ser 0.75; Hemoglobin 14.4; Platelets 284; Potassium 4.3; Sodium 141   Recent Lipid Panel    Component Value Date/Time   CHOL 131 09/27/2020 0904   TRIG 74 09/27/2020 0904   HDL 31 (L) 09/27/2020 0904   CHOLHDL 4.2 09/27/2020 0904   CHOLHDL 6.1 07/04/2019 0537   VLDL 17 07/04/2019 0537   LDLCALC 85 09/27/2020 0904    Risk Assessment/Calculations:       Physical Exam:    VS:  BP  128/86   Pulse 95   Ht 5' 2.5" (1.588 m)   Wt 181 lb 9.6 oz (82.4 kg)   SpO2 96%   BMI 32.69 kg/m     Wt Readings from Last 3 Encounters:  11/22/20 181 lb 9.6 oz (82.4 kg)  09/06/20 174 lb (78.9 kg)  08/09/20 174 lb (78.9 kg)     GEN: Well nourished, well developed in no acute distress HEENT: Normal NECK: No JVD; No carotid bruits LYMPHATICS: No lymphadenopathy CARDIAC: RRR, no murmurs, rubs, gallops RESPIRATORY:  Clear to auscultation without rales, wheezing or rhonchi  ABDOMEN: Soft, non-tender, non-distended MUSCULOSKELETAL:  No edema; No deformity  SKIN: Warm and dry NEUROLOGIC:  Alert and oriented x 3 PSYCHIATRIC:  Normal affect    ASSESSMENT:  1. Hyperlipidemia LDL goal <70   2. Coronary artery disease of native artery of native heart with stable angina pectoris (HCC)   3. Medication management   4. Pure hypercholesterolemia   5. Tobacco use   6. Other depression   7. Multiple sclerosis (HCC)     PLAN:    In order of problems listed above: CAD (coronary artery disease) Non-ST elevation microinfarction, stents to both distal LM, LAD and circumflex.  Has been on Brilinta and aspirin.  Was unable to do cardiac rehab previously because of work schedule.  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  She is still feeling short winded.  Does not have much energy.  I decided to transition her from her Brilinta 60 mg twice a day over to Plavix 75 mg once a day.  She does state that she feels worse after taking the Brilinta.  Perhaps short of breath side effect.  I will also discontinue her/taper off her metoprolol 25 mg twice a day.  It has been over 1 year since her myocardial infarction.  Lets see if this helps her with her energy.  Otherwise, blood work has been reassuring, recent nuclear stress test has been reassuring echo reassuring with EF of 60%.    Hyperlipidemia Even with atorvastatin 80 mg and Zetia 10 mg, her  LDL is still above goal.  At last check it was 85.  Given her aggressive CAD history, I will send her to the lipid clinic for potential PCSK9 inhibitor or other therapy.  Tobacco use Continue to encourage cessation.  Has tried Wellbutrin in the past.  Frustrated with weight gain with tobacco cessation.  Depression Continue to work with Dr. Laural Benes on this.  Multiple sclerosis (HCC) Has seen neurology in the past.  Coronary disease -Non-ST elevation microinfarction, stents to both distal LM, LAD and circumflex.  Has been on Brilinta and aspirin.  Unable to do cardiac rehab because of work schedule.  Diagnostic Dominance: Right  Intervention     Nonsustained ventricular tachycardia -Stable now.  Post intervention.  Converted with amiodarone previously.  Hyperlipidemia -Continue with high intensity statin therapy Lipitor 80 last LDL 65 excellent.  ALT 25.  TSH 0.9.  Multiple sclerosis -Seeing neurology.  Tobacco use -Continue to encourage cessation.  Has tried Wellbutrin.  Frustrated by the weight gain with tobacco cessation.  Talked about decreasing carbohydrates.  Depression -PCP assisting with this.  Recurrent sinus infection -She has reached out to [employer], PA's office for amoxicillin refill.  Has not heard back.  I will send a message on her behalf.   Follow-up:   6 months with Grace Shields, NP.  Medication Adjustments/Labs and Tests Ordered: Current medicines are reviewed at length with the patient today.  Concerns regarding medicines are outlined above.   Orders Placed This Encounter  Procedures   AMB Referral to Heartcare Pharm-D    Meds ordered this encounter  Medications   clopidogrel (PLAVIX) 75 MG tablet    Sig: Take 1 tablet (75 mg total) by mouth daily.    Dispense:  90 tablet    Refill:  3    STOP BRILINTA    Patient Instructions  Medication Instructions:  Your physician has recommended you make the following change in your  medication:   Change Brilinta to Plavix 75 mg daily   Taper off of the metoprolol... only take one for 2 days then stop   *If you need  a refill on your cardiac medications before your next appointment, please call your pharmacy*   Lab Work: none If you have labs (blood work) drawn today and your tests are completely normal, you will receive your results only by: MyChart Message (if you have MyChart) OR A paper copy in the mail If you have any lab test that is abnormal or we need to change your treatment, we will call you to review the results.   Testing/Procedures: none   Follow-Up: At [facility], you and your health needs are our priority.  As part of our continuing mission to provide you with exceptional heart care, we have created designated Provider Care Teams.  These Care Teams include your primary Cardiologist (physician) and Advanced Practice Providers (APPs -  Physician Assistants and Nurse Practitioners) who all work together to provide you with the care you need, when you need it.  We recommend signing up for the patient portal called "MyChart".  Sign up information is provided on this After Visit Summary.  MyChart is used to connect with patients for Virtual Visits (Telemedicine).  Patients are able to view lab/test results, encounter notes, upcoming appointments, etc.  Non-urgent messages can be sent to your provider as well.   To learn more about what you can do with MyChart, go to [email or web address].    Your next appointment:   6 month(s)  The format for your next appointment:   In Person  Provider:   [name], NP   Other Instructions Referral to the Lipid Clinic     I,Mathew Stumpf,acting as a scribe for Grace Schultz, MD.,have documented all relevant documentation on the behalf of Grace Schultz, MD,as directed by  Grace Schultz, MD while in the presence of Grace Schultz, MD.  I, Grace Schultz, MD, have reviewed all documentation for this visit. The  documentation on 11/22/20 for the exam, diagnosis, procedures, and orders are all accurate and complete.   Signed, Grace Schultz, MD  11/22/2020 10:21 AM    [facility]

## 2020-11-22 ENCOUNTER — Other Ambulatory Visit: Payer: Self-pay

## 2020-11-22 ENCOUNTER — Ambulatory Visit (INDEPENDENT_AMBULATORY_CARE_PROVIDER_SITE_OTHER): Payer: Medicaid Other | Admitting: Cardiology

## 2020-11-22 VITALS — BP 128/86 | HR 95 | Ht 62.5 in | Wt 181.6 lb

## 2020-11-22 DIAGNOSIS — I25118 Atherosclerotic heart disease of native coronary artery with other forms of angina pectoris: Secondary | ICD-10-CM

## 2020-11-22 DIAGNOSIS — G35 Multiple sclerosis: Secondary | ICD-10-CM

## 2020-11-22 DIAGNOSIS — Z720 Tobacco use: Secondary | ICD-10-CM | POA: Insufficient documentation

## 2020-11-22 DIAGNOSIS — E785 Hyperlipidemia, unspecified: Secondary | ICD-10-CM

## 2020-11-22 DIAGNOSIS — F32A Depression, unspecified: Secondary | ICD-10-CM | POA: Insufficient documentation

## 2020-11-22 DIAGNOSIS — E7800 Pure hypercholesterolemia, unspecified: Secondary | ICD-10-CM

## 2020-11-22 DIAGNOSIS — F3289 Other specified depressive episodes: Secondary | ICD-10-CM

## 2020-11-22 DIAGNOSIS — Z79899 Other long term (current) drug therapy: Secondary | ICD-10-CM

## 2020-11-22 MED ORDER — CLOPIDOGREL BISULFATE 75 MG PO TABS: 75.0000 mg | ORAL_TABLET | Freq: Every day | ORAL | 3 refills | Status: DC

## 2020-11-22 NOTE — Patient Instructions (Signed)
Medication Instructions:  Your physician has recommended you make the following change in your medication:   Change Brilinta to Plavix 75 mg daily   Taper off of the metoprolol... only take one for 2 days then stop   *If you need a refill on your cardiac medications before your next appointment, please call your pharmacy*   Lab Work: none If you have labs (blood work) drawn today and your tests are completely normal, you will receive your results only by: MyChart Message (if you have MyChart) OR A paper copy in the mail If you have any lab test that is abnormal or we need to change your treatment, we will call you to review the results.   Testing/Procedures: none   Follow-Up: At [facility], you and your health needs are our priority.  As part of our continuing mission to provide you with exceptional heart care, we have created designated Provider Care Teams.  These Care Teams include your primary Cardiologist (physician) and Advanced Practice Providers (APPs -  Physician Assistants and Nurse Practitioners) who all work together to provide you with the care you need, when you need it.  We recommend signing up for the patient portal called "MyChart".  Sign up information is provided on this After Visit Summary.  MyChart is used to connect with patients for Virtual Visits (Telemedicine).  Patients are able to view lab/test results, encounter notes, upcoming appointments, etc.  Non-urgent messages can be sent to your provider as well.   To learn more about what you can do with MyChart, go to [email or web address].    Your next appointment:   6 month(s)  The format for your next appointment:   In Person  Provider:   [name], NP   Other Instructions Referral to the Lipid Clinic

## 2020-11-22 NOTE — Assessment & Plan Note (Signed)
Continue to work with Dr. Laural Benes on this.

## 2020-11-22 NOTE — Assessment & Plan Note (Signed)
Even with atorvastatin 80 mg and Zetia 10 mg, her LDL is still above goal.  At last check it was 85.  Given her aggressive CAD history, I will send her to the lipid clinic for potential PCSK9 inhibitor or other therapy.

## 2020-11-22 NOTE — Assessment & Plan Note (Signed)
Continue to encourage cessation.  Has tried Wellbutrin in the past.  Frustrated with weight gain with tobacco cessation.

## 2020-11-22 NOTE — Assessment & Plan Note (Signed)
Has seen neurology in the past.

## 2020-11-22 NOTE — Assessment & Plan Note (Signed)
Non-ST elevation microinfarction, stents to both distal LM, LAD and circumflex.  Has been on Brilinta and aspirin.  Was unable to do cardiac rehab previously because of work schedule.  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  She is still feeling short winded.  Does not have much energy.  I decided to transition her from her Brilinta 60 mg twice a day over to Plavix 75 mg once a day.  She does state that she feels worse after taking the Brilinta.  Perhaps short of breath side effect.  I will also discontinue her/taper off her metoprolol 25 mg twice a day.  It has been over 1 year since her myocardial infarction.  Lets see if this helps her with her energy.  Otherwise, blood work has been reassuring, recent nuclear stress test has been reassuring echo reassuring with EF of 60%.

## 2020-12-03 ENCOUNTER — Other Ambulatory Visit: Payer: Self-pay

## 2020-12-03 ENCOUNTER — Encounter: Payer: Self-pay | Admitting: Dietician

## 2020-12-03 ENCOUNTER — Encounter: Payer: Medicaid Other | Attending: Internal Medicine | Admitting: Dietician

## 2020-12-03 DIAGNOSIS — E7800 Pure hypercholesterolemia, unspecified: Secondary | ICD-10-CM | POA: Diagnosis present

## 2020-12-03 DIAGNOSIS — I2510 Atherosclerotic heart disease of native coronary artery without angina pectoris: Secondary | ICD-10-CM | POA: Diagnosis not present

## 2020-12-03 NOTE — Progress Notes (Addendum)
Medical Nutrition Therapy  Appointment Start time:  [phone]  Appointment End time:  1100  Primary concerns today: She wants to know what she can eat to prevent weight gain and lower cholesterol, she would also know what foods can prevent migrains   Referral diagnosis: HLD, CAD with angina, medication management, obesity Preferred learning style: no preference indicated Learning readiness: ready   NUTRITION ASSESSMENT   Anthropometrics  62.5" 180 lbs 12/03/2020 137 lbs 06/2019 Gained weight as she has no energy to do anything since heart attack.  Clinical Medical Hx: MI (07/03/2019), HTN, HLD, MS, eye problems, smoking, constipation (since childhood) Medications: see list to include Lipitor, Zetia, and Plavix Labs: A1C 5.2% 07/03/2019, cholesterol 131, HDL 31, LDL 85, Triglycerides 74 09/27/2020 (improved from 08/09/2020) Notable Signs/Symptoms: poor energy  Lifestyle & Dietary Hx Patient lives with her significant other, 2 children (17 and 22 as well as her step daughter). She moved from [place] to [place] about 4 years ago. 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  Her sister's father and mother-in-law will also be moving in and she will be responsible for them as well.  He is on peritoneal dialysis.  She works from 6:30 am-6 pm. She has an Anaphylaxis allergy to PORK. Chocolate increases migraine pain. Doesn't smoke in the house. Drinks no water and 6 Starbucks double shot beverages daily Treats constipation with laxative most days. Food insecurity.  Estimated daily fluid intake: 100 oz daily (coffee and tea only) Supplements: none Sleep: "not a good sleeper" problems falling asleep and staying asleep, no sleeping during the day but tired during the day Stress / self-care: "high" Current average weekly physical activity: 92 stairs in her sister's home.  Used to ride bikes and "lite" jog but stopped after the MI.  24-Hr Dietary Recall Some days she doesn't eat if she is not  hungry Poor appetite. Likes vegetables and fruit First Meal: none Snack: none Second Meal: none Snack: handful of peanuts Third Meal: meatloaf Snack:  Beverages: coffee (6 cans of Starbucks mocha double shot daily), hot tea with Splenda, occasional juice (cranberry or apple), NO WATER (Each Starbucks drink contains:  210 calories 3.3 grams fat, 1.5 grams saturated fat, 35 grams carbohydrates, and 10 grams protein.  She obtains 1260 calories from these drinks daily.)  Estimated Energy Needs Calories: 1200-1400 Carbohydrate: 135-158g Protein: 90-105g Fat: 33-39g   NUTRITION DIAGNOSIS  NB-1.1 Food and nutrition-related knowledge deficit As related to balance of carbohydrates, protein, and fat.  As evidenced by diet hx and patient report.   NUTRITION INTERVENTION  Nutrition education (E-1) on the following topics:  Fiber Fat Fruit and vegetables, legumes Balance meals Beverage choices Migraines and nutrition  Handouts Provided Include  Food resources Food pantries in [place]  Learning Style & Readiness for Change Teaching method utilized: Patent attorney & Auditory  Demonstrated degree of understanding via: Teach Back  Barriers to learning/adherence to lifestyle change: stress, health, current lifestyle choices  Goals Established by Pt Increase fruits and vegetables Choose more meatless options.  Will try to decrease the Starbucks shots (5 per day rather than 6) Increase water intake  Foods that may trigger migraines in some people: Chocolate Aged cheese Cured meat Smoked fish Yeast extracts Nitrates/nitrates found in foods Artificial sweeteners MSG   MONITORING & EVALUATION Dietary intake, weekly physical activity in 1 month.  Next Steps  Patient is to call for questions.

## 2020-12-03 NOTE — Patient Instructions (Addendum)
Patient goal: Increase fruits and vegetables Choose more meatless options.  Will try to decrease the Starbucks shots (5 per day rather than 6) Increase water intake  Foods that may trigger migraines in some people: Chocolate Aged cheese Cured meat Smoked fish Yeast extracts Nitrates/nitrates found in foods Artificial sweeteners MSG

## 2020-12-06 ENCOUNTER — Telehealth: Payer: Self-pay | Admitting: Internal Medicine

## 2020-12-06 ENCOUNTER — Other Ambulatory Visit: Payer: Self-pay

## 2020-12-06 ENCOUNTER — Ambulatory Visit (INDEPENDENT_AMBULATORY_CARE_PROVIDER_SITE_OTHER): Payer: Medicaid Other | Admitting: Internal Medicine

## 2020-12-06 VITALS — BP 133/86 | HR 97 | Ht 62.5 in | Wt 176.0 lb

## 2020-12-06 DIAGNOSIS — E782 Mixed hyperlipidemia: Secondary | ICD-10-CM

## 2020-12-06 DIAGNOSIS — I25118 Atherosclerotic heart disease of native coronary artery with other forms of angina pectoris: Secondary | ICD-10-CM

## 2020-12-06 DIAGNOSIS — E785 Hyperlipidemia, unspecified: Secondary | ICD-10-CM | POA: Diagnosis not present

## 2020-12-06 DIAGNOSIS — I214 Non-ST elevation (NSTEMI) myocardial infarction: Secondary | ICD-10-CM

## 2020-12-06 DIAGNOSIS — Z955 Presence of coronary angioplasty implant and graft: Secondary | ICD-10-CM

## 2020-12-06 MED ORDER — PRALUENT 75 MG/ML ~~LOC~~ SOAJ
1.0000 | SUBCUTANEOUS | 11 refills | Status: DC
Start: 2020-12-06 — End: 2021-01-01

## 2020-12-06 NOTE — Patient Instructions (Signed)
Medication Instructions:  Dr. Rennis Golden recommends Praluent 75mg /ml (PCSK9). This is an injectable cholesterol medication self-administered once every 14 days. This medication will likely need prior approval with your insurance company, which we will work on. If the medication is not approved initially, we may need to do an appeal with your insurance.   Administer medication in area of fatty tissue such as abdomen, outer thigh, back of upper arm - and rotate site with each injection Store medication in refrigerator until ready to administer - allow to sit at room temp for 30 mins - 1 hour prior to injection Dispose of medication in a SHARPS container - your pharmacy should be able to direct you on this and proper disposal   If you need a co-pay card for Repatha: >> paying for Repatha or red box that says "Repatha Copay Card" in top right If you need a co-pay card for Praluent: [email or web address] >> starting & paying for Praluent  Patient Assistance:   The PAN Foundation: https://www.panfoundation.org/disease-funds/hypercholesterolemia/ -- can sign up for wait list  The Health Well foundation offers assistance to help pay for medication copays.  They will cover copays for all cholesterol lowering meds, including statins, fibrates, omega-3 fish oils like Vascepa, ezetimibe, Repatha, Praluent, Nexletol, Nexlizet.  The cards are usually good for $2,500 or 12 months, whichever comes first. Go to healthwellfoundation.org Click on "Apply Now" Answer questions as to whom is applying (patient or representative) Your disease fund will be "hypercholesterolemia - Medicare access" They will ask questions about finances and which medications you are taking for cholesterol When you submit, the approval is usually within minutes.  You will need to print the card information from the site You will need to show this information to your pharmacy, they will bill your Medicare Part D plan first -then bill Health  Well --for the copay.   You can also call them at [phone], although the hold times can be quite long.     *If you need a refill on your cardiac medications before your next appointment, please call your pharmacy*   Lab Work: FASTING lab work to check cholesterol in 3-4 months  -- complete about 1 week before your next visit with Dr. [phone]   If you have labs (blood work) drawn today and your tests are completely normal, you will receive your results only by: MyChart Message (if you have MyChart) OR A paper copy in the mail If you have any lab test that is abnormal or we need to change your treatment, we will call you to review the results.   Testing/Procedures: Genetic Test   Follow-Up: At [facility], you and your health needs are our priority.  As part of our continuing mission to provide you with exceptional heart care, we have created designated Provider Care Teams.  These Care Teams include your primary Cardiologist (physician) and Advanced Practice Providers (APPs -  Physician Assistants and Nurse Practitioners) who all work together to provide you with the care you need, when you need it.  We recommend signing up for the patient portal called "MyChart".  Sign up information is provided on this After Visit Summary.  MyChart is used to connect with patients for Virtual Visits (Telemedicine).  Patients are able to view lab/test results, encounter notes, upcoming appointments, etc.  Non-urgent messages can be sent to your provider as well.   To learn more about what you can do with MyChart, go to [facility].    Your next appointment:  About 4 months with Dr. Rennis Golden -- lipid clinic -- can be in-person or virtual visit

## 2020-12-06 NOTE — Telephone Encounter (Signed)
Genetic test for ASCVD/dyslipidemia panel ordered ([facility]) Cheek swab completed in office Specimen and necessary paperwork mailed. ID: [id number]

## 2020-12-06 NOTE — Telephone Encounter (Signed)
PA for Praluent 75mg /mL submitted via CMM (Key: [id number])

## 2020-12-06 NOTE — Progress Notes (Signed)
LIPID CLINIC CONSULT NOTE  Chief Complaint:  Manage dyslipidemia  Primary Care Physician: Marcine Matar, MD  Primary Cardiologist:  Donato Schultz, MD  HPI:  Grace Castillo is a 40 y.o. female who is being seen today for the evaluation of dyslipidemia at the request of Jake Bathe, MD. this is a pleasant but unfortunate 40 year old female with very early onset heart disease who had an non-ST elevation MI in May 2021 complicated by ventricular tachycardia and was found to have 90% stenosis of the proximal LAD.  She underwent complex intervention with IVUS guidance.  Troponin peaked at 6000.  She continued to have some chest pain after that and Lexiscan Myoview was ordered which was negative for ischemia and showed normal LV function.  Some of her symptoms might of been related to Brilinta which was changed to Plavix and she notes improvement in her shortness of breath.  Her lipid therapy was intensified during this hospitalization.  Previous untreated LDL cholesterol was 166 and recent labs showed total cholesterol 131, HDL 31, triglycerides 74 and LDL 85.  Given her young age and high risk pathology as well as family history of early onset heart disease including her mother who had a stroke in maternal grandfather who died at 56 of an MI, I am suspicious she has a genetic dyslipidemia.  I would put her in the "very high risk" category and therefore target her LDL cholesterol to less than 55 mg/dL.  PMHx:  Past Medical History:  Diagnosis Date   Hyperlipidemia    Hypertension    MS (multiple sclerosis) (HCC)    NSTEMI (non-ST elevated myocardial infarction) (HCC)    NSVT (nonsustained ventricular tachycardia) 07/02/2019   S/P angioplasty with stent    DES to LAD and Lcx     Past Surgical History:  Procedure Laterality Date   CHOLECYSTECTOMY     CORONARY BALLOON ANGIOPLASTY N/A 07/03/2019   Procedure: CORONARY BALLOON ANGIOPLASTY;  Surgeon: Yvonne Kendall, MD;  Location: [facility]  INVASIVE CV LAB;  Service: Cardiovascular;  Laterality: N/A;  distal lad   CORONARY STENT INTERVENTION N/A 07/03/2019   Procedure: CORONARY STENT INTERVENTION;  Surgeon: Yvonne Kendall, MD;  Location: [facility] INVASIVE CV LAB;  Service: Cardiovascular;  Laterality: N/A;  cfx lad    INTRAVASCULAR ULTRASOUND/IVUS N/A 07/03/2019   Procedure: Intravascular Ultrasound/IVUS;  Surgeon: Yvonne Kendall, MD;  Location: [facility] INVASIVE CV LAB;  Service: Cardiovascular;  Laterality: N/A;   LEFT HEART CATH AND CORONARY ANGIOGRAPHY N/A 07/03/2019   Procedure: LEFT HEART CATH AND CORONARY ANGIOGRAPHY;  Surgeon: Yvonne Kendall, MD;  Location: [facility] INVASIVE CV LAB;  Service: Cardiovascular;  Laterality: N/A;   TUBAL LIGATION      FAMHx:  Family History  Problem Relation Age of Onset   Sudden Cardiac Death Maternal Grandfather     SOCHx:   reports that she has been smoking cigarettes. She has been smoking an average of 1 pack per day. She has never used smokeless tobacco. She reports current drug use. Drug: Marijuana. She reports that she does not drink alcohol.  ALLERGIES:  Allergies  Allergen Reactions   Pork-Derived Products Anaphylaxis   Sulfa Antibiotics Nausea And Vomiting    ROS: Pertinent items noted in HPI and remainder of comprehensive ROS otherwise negative.  HOME MEDS: Current Outpatient Medications on File Prior to Visit  Medication Sig Dispense Refill   aspirin EC 81 MG EC tablet Take 1 tablet (81 mg total) by mouth daily. 90 tablet 0   atorvastatin (  LIPITOR) 80 MG tablet Take 1 tablet (80 mg total) by mouth daily. 90 tablet 3   clopidogrel (PLAVIX) 75 MG tablet Take 1 tablet (75 mg total) by mouth daily. 90 tablet 3   ezetimibe (ZETIA) 10 MG tablet Take 1 tablet (10 mg total) by mouth daily. 90 tablet 3   fluticasone (FLONASE) 50 MCG/ACT nasal spray Place 2 sprays into both nostrils daily. 16 g 6   nitroGLYCERIN (NITROSTAT) 0.4 MG SL tablet Place 1 tablet (0.4 mg total) under the tongue  every 5 (five) minutes as needed for chest pain. 25 tablet 6   sertraline (ZOLOFT) 50 MG tablet Take 1 tablet (50 mg total) by mouth daily. 30 tablet 2   Vitamin D, Ergocalciferol, (DRISDOL) 1.25 MG (50000 UNIT) CAPS capsule TAKE 1 CAPSULE BY MOUTH EVERY 7 DAYS (Patient not taking: No sig reported) 16 capsule 0   No current facility-administered medications on file prior to visit.    LABS/IMAGING: No results found for this or any previous visit (from the past 48 hour(s)). No results found.  LIPID PANEL:    Component Value Date/Time   CHOL 131 09/27/2020 0904   TRIG 74 09/27/2020 0904   HDL 31 (L) 09/27/2020 0904   CHOLHDL 4.2 09/27/2020 0904   CHOLHDL 6.1 07/04/2019 0537   VLDL 17 07/04/2019 0537   LDLCALC 85 09/27/2020 0904    WEIGHTS: Wt Readings from Last 3 Encounters:  12/06/20 176 lb (79.8 kg)  12/03/20 180 lb (81.6 kg)  11/22/20 181 lb 9.6 oz (82.4 kg)    VITALS: BP 133/86   Pulse 97   Ht 5' 2.5" (1.588 m)   Wt 176 lb (79.8 kg)   SpO2 97%   BMI 31.68 kg/m   EXAM: General appearance: alert and no distress Neck: no carotid bruit, no JVD, and thyroid not enlarged, symmetric, no tenderness/mass/nodules Lungs: clear to auscultation bilaterally Heart: regular rate and rhythm, S1, S2 normal, no murmur, click, rub or gallop Abdomen: soft, non-tender; bowel sounds normal; no masses,  no organomegaly Extremities: extremities normal, atraumatic, no cyanosis or edema Pulses: 2+ and symmetric Skin: Skin color, texture, turgor normal. No rashes or lesions Neurologic: Grossly normal Psych: Pleasant  *Examination chaperoned by Julaine Fusi, RN.  EKG: Deferred  ASSESSMENT: CAD with prior NSTEMI (06/2019), status post DES to the proximal LAD Strong family history of cardiovascular disease Mixed dyslipidemia with target LDL less than 55  PLAN: 1.   Grace Castillo has coronary disease at a very early age.  This is concerning for a genetic dyslipidemia.  Although her untreated  LDL cholesterol appears to be in the 160s, it is still possible she could have a familial hyperlipidemia or perhaps multifactorial lipid disorder.  I would recommend genetic testing as she has 4 children ranging from age 38-22.  She is agreeable for that.  In addition she will need additional lipid lowering.  She is already on high intensity atorvastatin 80 mg daily and ezetimibe 10 mg daily.  Would recommend adding a PCSK9 inhibitor, possibly Praluent 75 mg every 2 weeks or as based on what her insurance will cover.  We will plan repeat lipids including an NMR and check an LP(a) given her early onset heart disease at follow-up in about 3 months.  If this is significantly elevated, she might qualify for research trial.  Follow-up with me at that time.  Thanks again for the kind referral.  Chrystie Nose, MD, Milagros Loll  [place]  [facility]  Medical  Director of the Advanced Lipid Disorders &  Cardiovascular Risk Reduction Clinic Diplomate of the American Board of Clinical Lipidology Attending Cardiologist  Direct Dial: [phone]  Fax: [phone]  Website:  [email or web address]  Lisette Abu Chesley Veasey 12/06/2020, 1:20 PM

## 2020-12-10 NOTE — Telephone Encounter (Signed)
Request Reference Number: [id number]. PRALUENT INJ 75MG /ML is approved through 12/06/2021.

## 2020-12-25 ENCOUNTER — Telehealth (HOSPITAL_BASED_OUTPATIENT_CLINIC_OR_DEPARTMENT_OTHER): Payer: Self-pay | Admitting: Internal Medicine

## 2020-12-25 NOTE — Telephone Encounter (Signed)
Spoke with patient.  Reports she has been itching non stop since Saturday 11/5.  Scratching her skin so violently it is causing welts.  Last Praluent injection was 11/28.  Next injection due 11/11.  Took two 25mg  Benadryl on the 6th, 3 Benadryl on the 7th with no relief.  Last week she vomited but does not know if that is related.  Brother is a [birth year]1977 who looked at her skin and said it looks like she is having a reaction to something and is planning on prescribing prednisone for her.  Reports no changes in diet or anything new she was exposed to.    Advised to hold off on next injection in 2 days.  Advised to contact PCP or urgent care for further treatment.  Will route to Dr Armed forces operational officer and Rennis Golden.

## 2020-12-25 NOTE — Telephone Encounter (Signed)
Pt c/o medication issue:  1. Name of Medication: Alirocumab (PRALUENT) 75 MG/ML SOAJ  2. How are you currently taking this medication (dosage and times per day)? As directed  3. Are you having a reaction (difficulty breathing--STAT)? itching  4. What is your medication issue? Patient has been itchy for the past 5 days. She said she has taken benadryl and used cortizone cream but it is still not giving her any relief

## 2020-12-25 NOTE — Telephone Encounter (Signed)
Thanks Thayer Ohm - I agree . Would like to see this resolve before considering a re-challenge of the medicine or trying an alternative.  -[place]

## 2020-12-30 NOTE — Telephone Encounter (Signed)
MyChart message sent to patient w/follow up on how she is doing

## 2020-12-31 NOTE — Telephone Encounter (Signed)
I think we should try to change to Repatha - this is an allergy likely to praluent, which may be due to the formulation, not the drug.  Dr Rexene Edison

## 2021-01-01 ENCOUNTER — Other Ambulatory Visit: Payer: Self-pay

## 2021-01-01 ENCOUNTER — Encounter (HOSPITAL_BASED_OUTPATIENT_CLINIC_OR_DEPARTMENT_OTHER): Payer: Self-pay

## 2021-01-01 NOTE — Telephone Encounter (Signed)
Patient updated with MD advice via MyChart message

## 2021-01-01 NOTE — Addendum Note (Signed)
Addended by: Lindell Spar on: 01/01/2021 11:21 AM   Modules accepted: Orders

## 2021-01-02 MED ORDER — EZETIMIBE 10 MG PO TABS: 10.0000 mg | ORAL_TABLET | Freq: Every day | ORAL | 3 refills | Status: DC

## 2021-01-03 NOTE — Telephone Encounter (Signed)
Patient had side effects on Praluent (itching)  Per MD, try Repatha (see 11/9 phone note) PA submitted via CMM (Key: [id number])

## 2021-01-06 MED ORDER — REPATHA SURECLICK 140 MG/ML ~~LOC~~ SOAJ: 1.0000 | SUBCUTANEOUS | 11 refills | Status: DC

## 2021-01-06 NOTE — Addendum Note (Signed)
Addended by: Lindell Spar on: 01/06/2021 08:23 AM   Modules accepted: Orders

## 2021-01-06 NOTE — Telephone Encounter (Signed)
Request Reference Number: [id number]. REPATHA SURE INJ 140MG /ML is approved through 01/03/2022. For further questions, call 01/05/2022 at [phone].

## 2021-01-06 NOTE — Telephone Encounter (Signed)
Rx for Repatha sent to [facility] 

## 2021-01-10 ENCOUNTER — Other Ambulatory Visit: Payer: Self-pay | Admitting: Internal Medicine

## 2021-01-10 DIAGNOSIS — F32A Depression, unspecified: Secondary | ICD-10-CM

## 2021-01-10 NOTE — Telephone Encounter (Signed)
Requested medication (s) are due for refill today: yes  Requested medication (s) are on the active medication list: yes  Last refill:  09/19/20 #30 2 RF  Future visit scheduled: no  Notes to clinic:  needs aapointment   Requested Prescriptions  Pending Prescriptions Disp Refills   sertraline (ZOLOFT) 50 MG tablet [Pharmacy Med Name: SERTRALINE 50MG  TABLETS] 30 tablet 2    Sig: TAKE 1 TABLET(50 MG) BY MOUTH DAILY     Psychiatry:  Antidepressants - SSRI Passed - 01/10/2021  6:57 AM      Passed - Completed PHQ-2 or PHQ-9 in the last 360 days      Passed - Valid encounter within last 6 months    Recent Outpatient Visits           5 months ago Nasal sinus congestion   [place] [facility], MD   1 year ago Depression, unspecified depression type   [facility] [place], [place], Marzella Schlein       Future Appointments             In 3 months Hilty, [place], MD [facility], [facility]

## 2021-01-23 ENCOUNTER — Encounter: Payer: Self-pay | Admitting: Dietician

## 2021-01-23 ENCOUNTER — Ambulatory Visit: Payer: Medicaid Other | Admitting: Dietician

## 2021-01-23 ENCOUNTER — Encounter: Payer: Medicaid Other | Attending: Internal Medicine | Admitting: Dietician

## 2021-01-23 DIAGNOSIS — E7800 Pure hypercholesterolemia, unspecified: Secondary | ICD-10-CM | POA: Insufficient documentation

## 2021-01-23 NOTE — Patient Instructions (Addendum)
Choose lean chicken, fish, vegetables, fruit  Avoid margarine, oil, avocados, nuts, seeds, and shellfish.   Decrease all processed foods.   Eat more foods as grown. Consider fat free dressing options. Continue to decrease your coffee drinks due to the types of fat and non-nutritive calories. Caffeine withdrawal can cause headaches.  Decrease your caffeine slowly and add water to help with the symptoms.

## 2021-01-23 NOTE — Progress Notes (Signed)
Medical Nutrition Therapy  Appointment Start time:  [phone]  Appointment End time:  1045 This is a virtual visit.  Patient is in her car and I am in my office.  She pulled off to a safe place.    Primary concerns today: She wants to know what she can eat to prevent weight gain and lower cholesterol, she would also know what foods can prevent migrains   Referral diagnosis: HLD, CAD with angina, medication management, obesity Preferred learning style: no preference indicated Learning readiness: ready   NUTRITION ASSESSMENT  Since her initial visit 12/03/2020, she has decreased the coffee shots from 6 to 4 daily.  She notes headaches when she decreases her caffeine. She is drinking more cranberry muice and sweet tea but is not drinking water.  She occasionally uses Gatorade zero.  Meals remain inconsistent.  We discussed her recent genetics test for cholesterol and reviewed in MD's 01/01/2021 message.  She is positive for familial hyperlipidemia, has the CETP protein which can lead to reduced levels of HDL cholesterol and has a mutation that can be associated with sitosterolemia.    Anthropometrics  62.5" 180 lbs 12/03/2020 137 lbs 06/2019 Gained weight as she has no energy to do anything since heart attack.  Clinical Medical Hx: MI (07/03/2019), HTN, HLD, MS, eye problems, smoking, constipation (since childhood) Medications: see list to include Lipitor, Zetia, and Plavix Labs: A1C 5.2% 07/03/2019, cholesterol 131, HDL 31, LDL 85, Triglycerides 74 09/27/2020 (improved from 08/09/2020) Notable Signs/Symptoms: poor energy  Lifestyle & Dietary Hx Patient lives with her significant other, 2 children (17 and 22 as well as her step daughter). She moved from [place] to [place] about 4 years ago. 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  Her sister's father and mother-in-law will also be moving in and she will be responsible for them as well.  He is on peritoneal dialysis.  She works from 6:30 am-6  pm.  She is also working on remodeling the house. She has an Anaphylaxis allergy to PORK. Orange juice - hives Chocolate increases migraine pain. Doesn't smoke in the house. Drinks no water and 6 Starbucks double shot beverages daily ( now decreased to 4 daily) Treats constipation with laxative most days. Food insecurity.  Estimated daily fluid intake: 100 oz daily (coffee and tea only) Supplements: none Sleep: "not a good sleeper" problems falling asleep and staying asleep, no sleeping during the day but tired during the day Stress / self-care: "high" Current average weekly physical activity: 92 stairs in her sister's home.  Used to ride bikes and "lite" jog but stopped after the MI.  24-Hr Dietary Recall Some days she doesn't eat if she is not hungry Poor appetite. Likes vegetables and fruit Not a big meat eater.  Prefers chicken and fish. First Meal: berry cinnamon roll Snack: none Second Meal: none Snack: pretzels, honey mustard combos (salty snack) Third Meal: salad with feta Snack:  Beverages: coffee (6 cans of Starbucks mocha double shot daily), hot tea with Splenda, occasional juice (cranberry or apple), NO WATER (Each Starbucks drink contains:  210 calories 3.3 grams fat, 1.5 grams saturated fat, 35 grams carbohydrates, and 10 grams protein.  She obtains 1260 calories from these drinks daily.)  Estimated Energy Needs Calories: 1200-1400 Carbohydrate: 135-158g Protein: 90-105g Fat: 33-39g   NUTRITION DIAGNOSIS  NB-1.1 Food and nutrition-related knowledge deficit As related to balance of carbohydrates, protein, and fat.  As evidenced by diet hx and patient report.   NUTRITION INTERVENTION  Nutrition education (E-1) on the following topics: Improvement of nutrition quality of diet Add water Continue to decrease the coffee drinks Discussed caffeine withdrawal symptoms Discussed plant sterol foods to avoid (margarine, oil, avocados, nuts, seeds,  shellfish)  Handouts Provided Include  Food resources Food pantries in [place] This visit, emailed patient the following link:   https://rarediseases.org/rare-diseases/sitosterolemia/ which discusses nutrition with this mutation  Learning Style & Readiness for Change Teaching method utilized: Visual & Auditory  Demonstrated degree of understanding via: Teach Back  Barriers to learning/adherence to lifestyle change: stress, health, current lifestyle choices  Goals Established by Pt Choose lean chicken, fish, vegetables, fruit  Avoid margarine, oil, avocados, nuts, seeds, and shellfish.   Decrease all processed foods.   Eat more foods as grown. Consider fat free dressing options. Continue to decrease your coffee drinks due to the types of fat and non-nutritive calories. Caffeine withdrawal can cause headaches.  Decrease your caffeine slowly and add water to help with the symptoms.  MONITORING & EVALUATION Dietary intake, weekly physical activity in 2 months.  Next Steps  Patient is to call for questions.

## 2021-02-06 ENCOUNTER — Other Ambulatory Visit: Payer: Self-pay | Admitting: Physician Assistant

## 2021-02-06 DIAGNOSIS — R0981 Nasal congestion: Secondary | ICD-10-CM

## 2021-03-16 ENCOUNTER — Other Ambulatory Visit: Payer: Self-pay | Admitting: Internal Medicine

## 2021-03-16 DIAGNOSIS — F32A Depression, unspecified: Secondary | ICD-10-CM

## 2021-03-17 ENCOUNTER — Telehealth: Payer: Self-pay | Admitting: *Deleted

## 2021-03-17 NOTE — Telephone Encounter (Signed)
Requested Prescriptions  Pending Prescriptions Disp Refills   sertraline (ZOLOFT) 50 MG tablet [Pharmacy Med Name: SERTRALINE 50MG  TABLETS] 30 tablet 2    Sig: TAKE 1 TABLET(50 MG) BY MOUTH DAILY     Psychiatry:  Antidepressants - SSRI Failed - 03/16/2021 11:49 AM      Failed - Valid encounter within last 6 months    Recent Outpatient Visits          7 months ago Nasal sinus congestion   [place] [facility], MD   1 year ago Depression, unspecified depression type   [facility] [place], [place], Marzella Schlein      Future Appointments            In 1 month Hilty, [place], MD [facility]   In 2 months Lisette Abu, MD [facility]           Passed - Completed PHQ-2 or PHQ-9 in the last 360 days

## 2021-03-17 NOTE — Telephone Encounter (Signed)
I called pt and made her an appt with Dr. Karle Plumber for 05/16/2021 at 1:30 for her 6 month check and refills.

## 2021-04-29 ENCOUNTER — Telehealth (HOSPITAL_BASED_OUTPATIENT_CLINIC_OR_DEPARTMENT_OTHER): Payer: Medicaid Other | Admitting: Internal Medicine

## 2021-05-16 ENCOUNTER — Ambulatory Visit: Payer: Medicaid Other | Attending: Internal Medicine | Admitting: Internal Medicine

## 2021-05-16 ENCOUNTER — Encounter: Payer: Self-pay | Admitting: Internal Medicine

## 2021-05-16 ENCOUNTER — Ambulatory Visit
Admission: RE | Admit: 2021-05-16 | Discharge: 2021-05-16 | Disposition: A | Discharge status: Home / Self Care | Payer: Medicaid Other | Source: Ambulatory Visit | Attending: Internal Medicine | Admitting: Internal Medicine

## 2021-05-16 VITALS — BP 125/82 | HR 98 | Resp 16 | Wt 186.4 lb

## 2021-05-16 DIAGNOSIS — R002 Palpitations: Secondary | ICD-10-CM

## 2021-05-16 DIAGNOSIS — M25551 Pain in right hip: Secondary | ICD-10-CM

## 2021-05-16 DIAGNOSIS — M79671 Pain in right foot: Secondary | ICD-10-CM

## 2021-05-16 DIAGNOSIS — G8929 Other chronic pain: Secondary | ICD-10-CM

## 2021-05-16 IMAGING — CR DG HIP (WITH OR WITHOUT PELVIS) 2-3V*R*
2 series · 2 of 2 positions shown · non-contrast
Comparison: No recent prior.

CLINICAL DATA: History of right hip pain.

EXAM:
DG HIP (WITH OR WITHOUT PELVIS) 2-3V RIGHT

[t hip ap right]
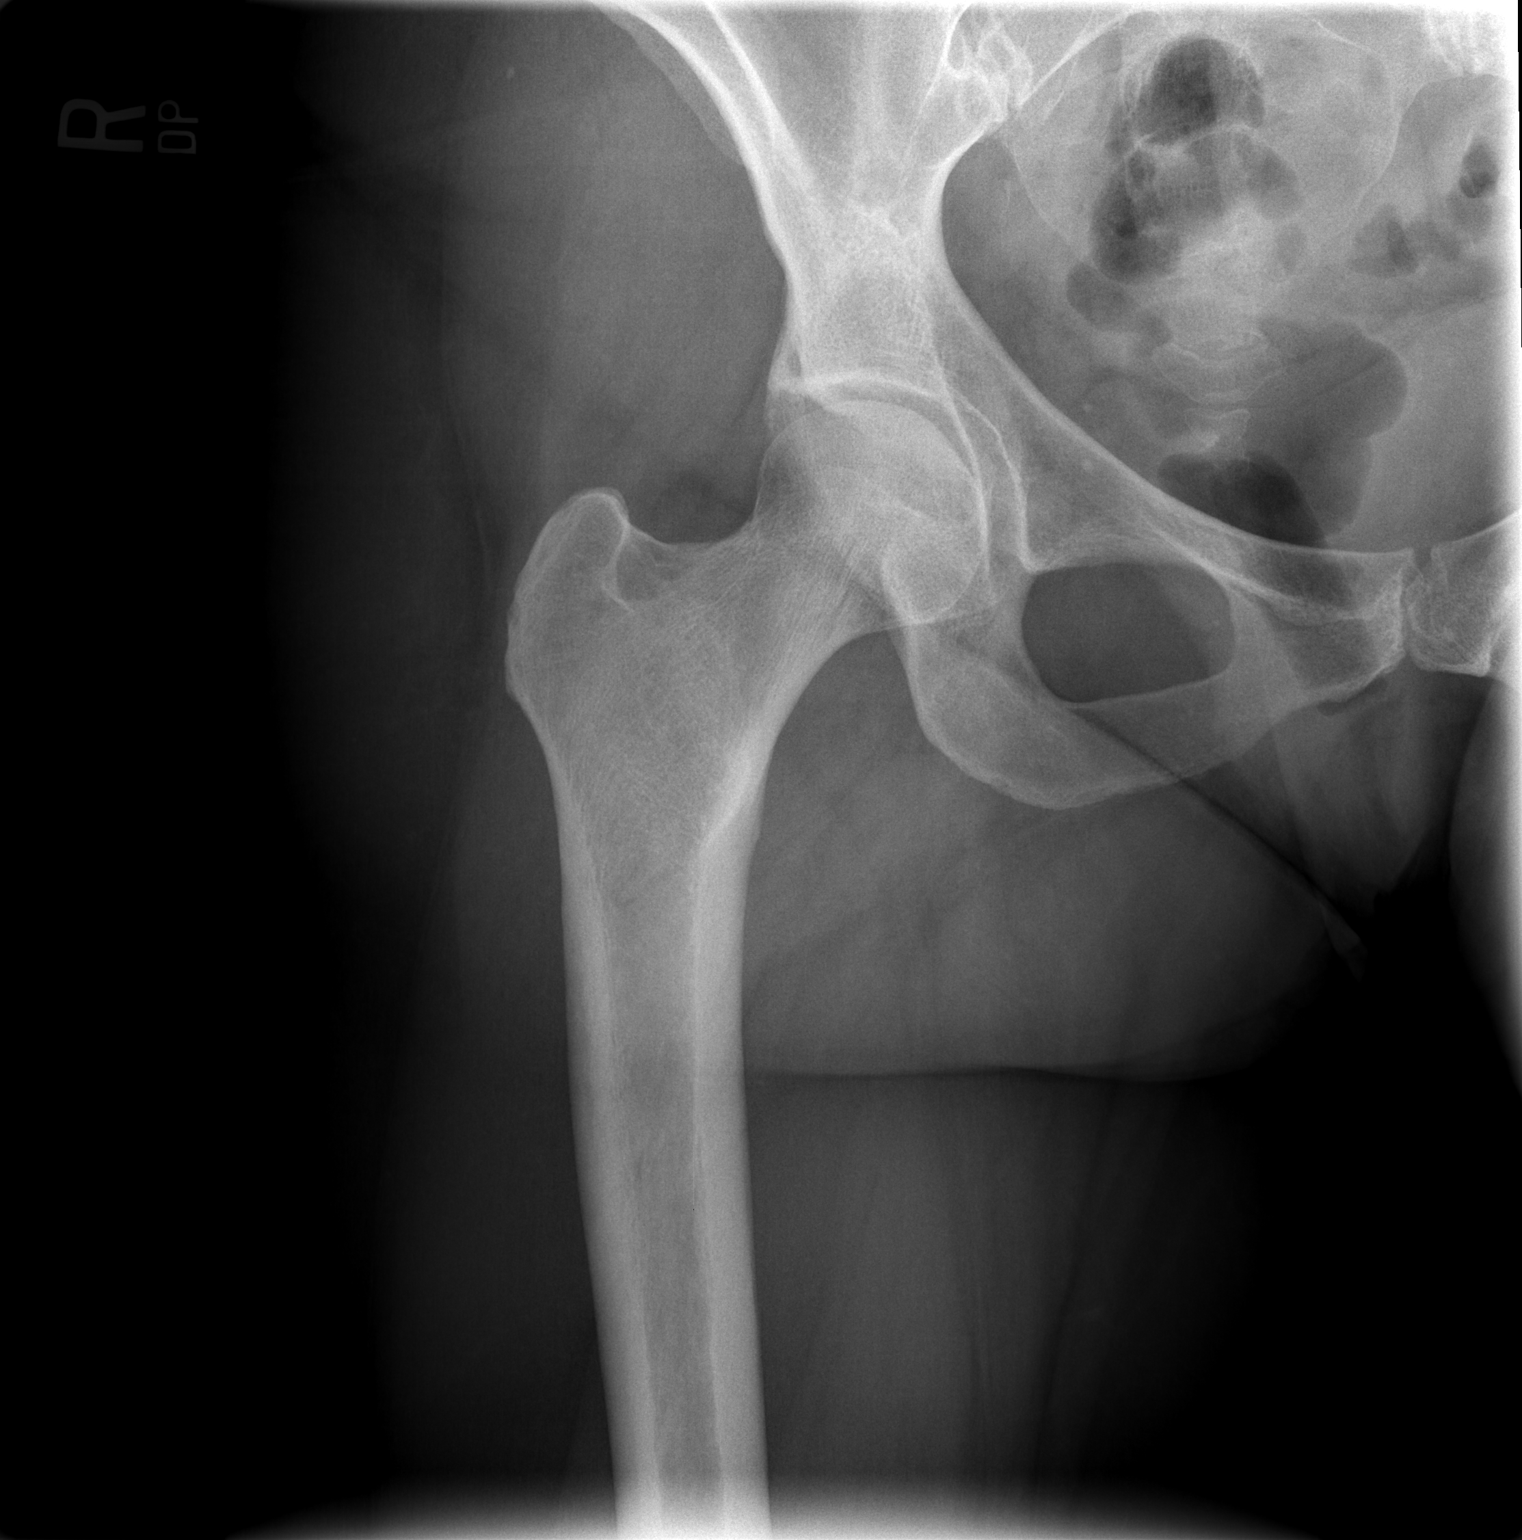

[t hip frog leg right]
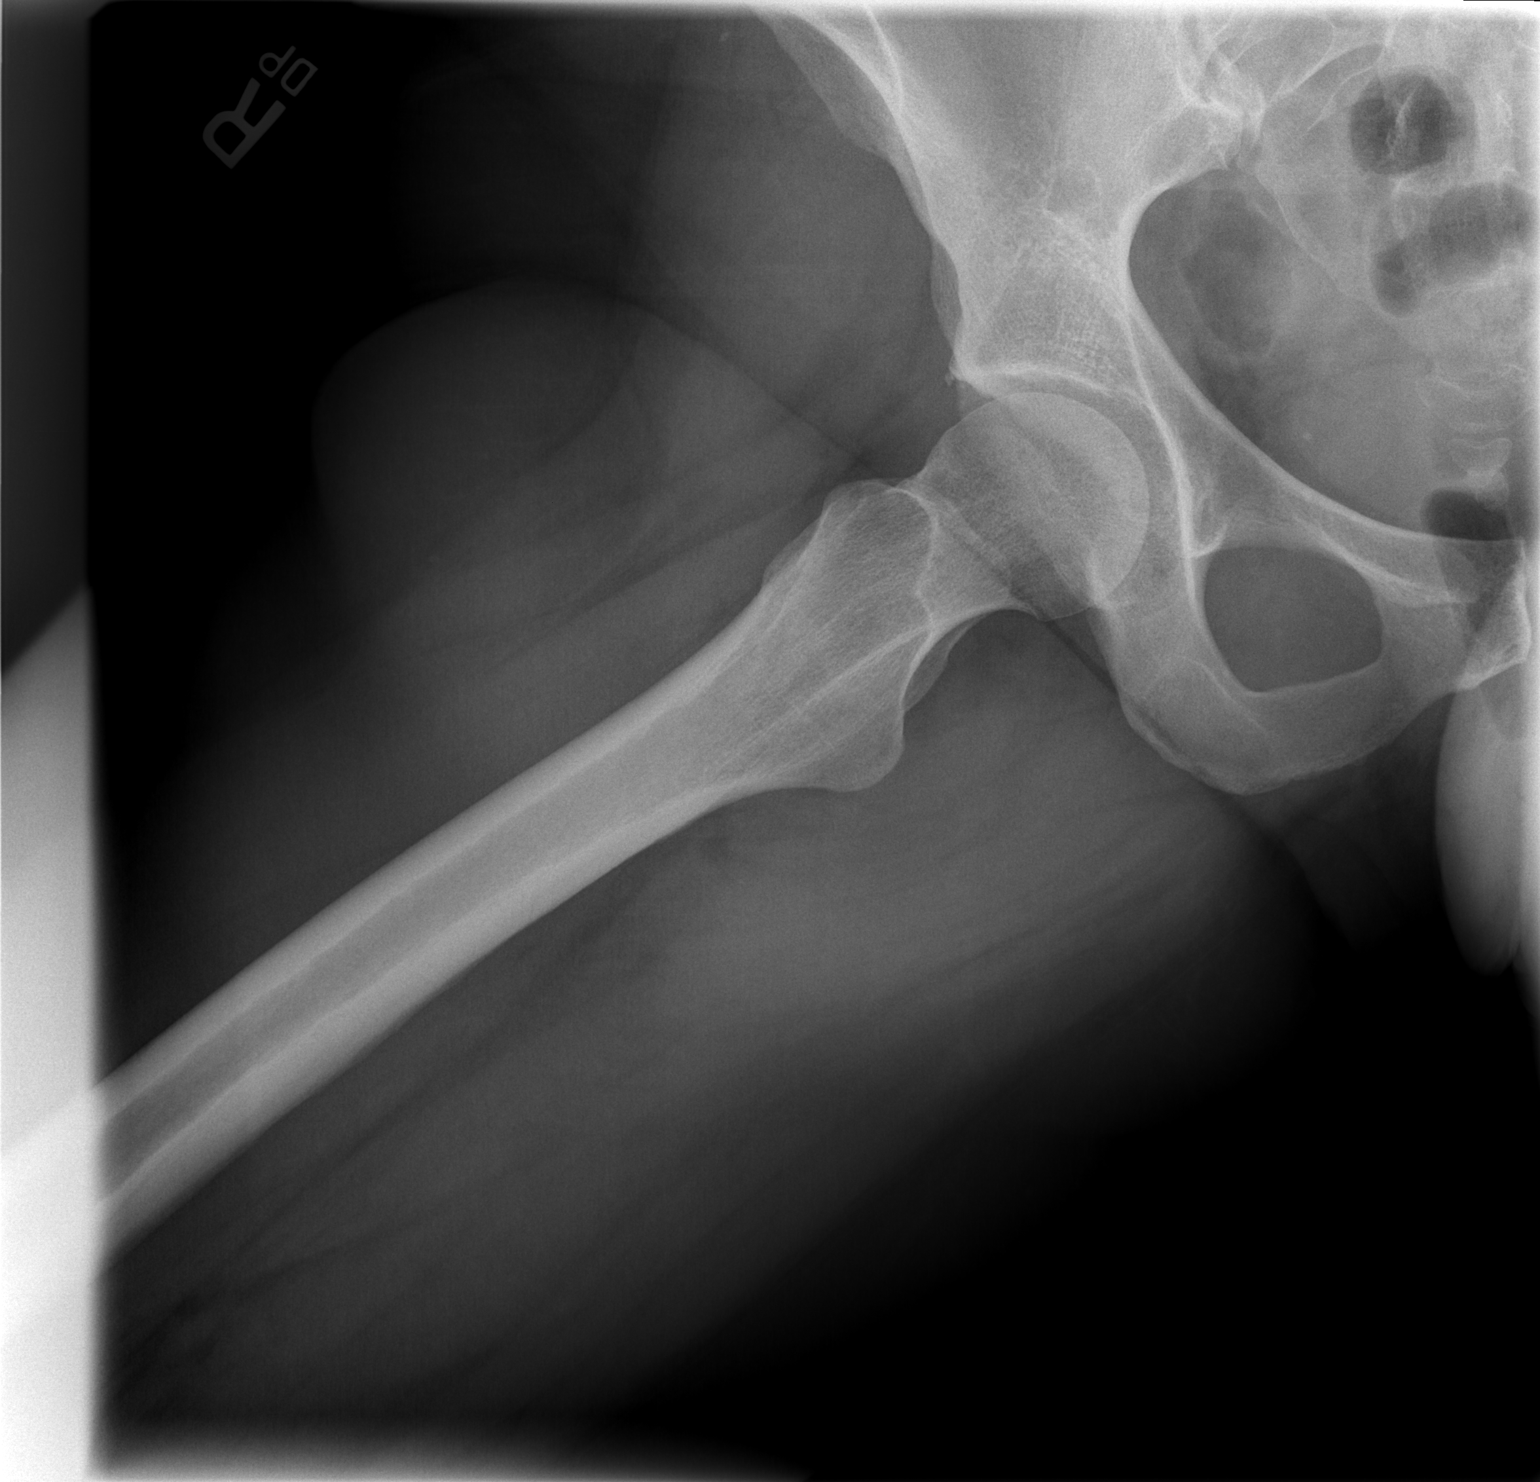

[2 of 2 positions shown; findings below may reference images not displayed]

FINDINGS: Degenerative change right hip. Small corticated bony density noted
adjacent to the acetabulum most likely related to degenerative
change and or old injury. Right femur is intact. No evidence of
dislocation. Pelvic calcifications consistent phleboliths.
IMPRESSION: Degenerative changes right hip. Small corticated bony density noted
adjacent to the acetabulum most likely related to degenerative
change and or old injury. Right femur is intact.

## 2021-05-16 MED ORDER — DICLOFENAC SODIUM 1 % EX GEL: 2.0000 g | Freq: Four times a day (QID) | CUTANEOUS | 2 refills | Status: DC

## 2021-05-16 NOTE — Progress Notes (Signed)
? ? ?Patient ID: Grace Castillo, female    DOB: 1980/09/27  MRN: [id number] ? ?CC: sinus infection, Hip Pain (Right/Popping sound/Shooting pain down leg /Calf gets tight ), and Referral (Podiatry ) ? ? ?Subjective: ?Grace Castillo is a 41 y.o. female who presents for chronic ds management ?Her concerns today include:  ?Pt with hx of MS, HTN, tob dep, CAD (NSTEMI 07/2019, NSVT, DES LAD), HL. ? ?Thinks she has sinus infection: sneezing, sinus drainage, watery itchy eyes, light cough x 1 wk. No fever. Using Flonase PRN and Benadryl.  Latter not help ? ?C/o problems with RT hip.  Gives out at times.  ?Constant feeling like something needs to pop for it to work properly.  Sends shoot pain down her leg.  Not able to walk a block without having to sit down due to pain.  Hurts to walk up the stairs in her sister house.  Uses a chart in grocery store to lean on to take wgh off the hip while shopping.  Hurts to lay on RT side. ?Pain waxes and wanes.  Worse when on her feet.  Most of time pain 7-8/10.  ?Symptoms present for 4-5 mths ?No initiating factors and no previous issue with the hip.  Occasional pain in the lower back ?Sometimes she gets bad cramps in lateral hip area ?Takes Tylenol which does not really help.  Took one of her father's Tylenol with codeine and that did not help either. ? ?C/o pain RT plantar heel x 1.5 mth ?Notice discoloration over the area. ?Hurts to put pressure on the heel or even put her tennis shoe on.  ?No injury to the heel. Has a callous over some of the area.  Wears comfortable shoes that are not tight. ?She is on Plavix according to her med list but she is uncertain whether she is still taking it or not. ? ?Reports getting palpitations at times.  Other times her heart rate stays above 100.  Last saw her cardiologist Dr. Marlou Porch in October. ? ?Declines pap for now ?Patient Active Problem List  ? Diagnosis Date Noted  ? Hyperlipidemia 11/22/2020  ? Tobacco use 11/22/2020  ? Depression 11/22/2020  ?  Multiple sclerosis ([place]) 11/22/2020  ? CAD (coronary artery disease) 07/05/2019  ? Non-ST elevation (NSTEMI) myocardial infarction ([place]) 07/05/2019  ? Unstable angina ([place]) 07/03/2019  ? Ventricular tachycardia   ?  ? ?Current Outpatient Medications on File Prior to Visit  ?Medication Sig Dispense Refill  ? aspirin EC 81 MG EC tablet Take 1 tablet (81 mg total) by mouth daily. 90 tablet 0  ? atorvastatin (LIPITOR) 80 MG tablet Take 1 tablet (80 mg total) by mouth daily. 90 tablet 3  ? clopidogrel (PLAVIX) 75 MG tablet Take 1 tablet (75 mg total) by mouth daily. 90 tablet 3  ? ezetimibe (ZETIA) 10 MG tablet Take 1 tablet (10 mg total) by mouth daily. 90 tablet 3  ? fluticasone (FLONASE) 50 MCG/ACT nasal spray Place 2 sprays into both nostrils daily. 16 g 6  ? nitroGLYCERIN (NITROSTAT) 0.4 MG SL tablet Place 1 tablet (0.4 mg total) under the tongue every 5 (five) minutes as needed for chest pain. 25 tablet 6  ? sertraline (ZOLOFT) 50 MG tablet TAKE 1 TABLET(50 MG) BY MOUTH DAILY 30 tablet 2  ? Vitamin D, Ergocalciferol, (DRISDOL) 1.25 MG (50000 UNIT) CAPS capsule TAKE 1 CAPSULE BY MOUTH EVERY 7 DAYS (Patient not taking: No sig reported) 16 capsule 0  ? ?No current facility-administered medications on  file prior to visit.  ? ? ?Allergies  ?Allergen Reactions  ? Orange Juice [Orange Oil] Hives  ? Pork-Derived Products Anaphylaxis  ? Repatha [Evolocumab] Itching  ?  Itching, welts, body felt on fire  ? Praluent [Alirocumab] Itching  ? Sulfa Antibiotics Nausea And Vomiting  ? ? ?Social History  ? ?Socioeconomic History  ? Marital status: Single  ?  Spouse name: Not on file  ? Number of children: Not on file  ? Years of education: Not on file  ? Highest education level: Not on file  ?Occupational History  ? Not on file  ?Tobacco Use  ? Smoking status: Every Day  ?  Packs/day: 1.00  ?  Types: Cigarettes  ? Smokeless tobacco: Never  ?Substance and Sexual Activity  ? Alcohol use: Never  ? Drug use: Yes  ?  Types: Marijuana  ?  Sexual activity: Yes  ?Other Topics Concern  ? Not on file  ?Social History Narrative  ? Not on file  ? ?Social Determinants of Health  ? ?Financial Resource Strain: Low Risk   ? Difficulty of Paying Living Expenses: Not hard at all  ?Food Insecurity: No Food Insecurity  ? Worried About Charity fundraiser in the Last Year: Never true  ? Ran Out of Food in the Last Year: Never true  ?Transportation Needs: No Transportation Needs  ? Lack of Transportation (Medical): No  ? Lack of Transportation (Non-Medical): No  ?Physical Activity: Inactive  ? Days of Exercise per Week: 0 days  ? Minutes of Exercise per Session: 0 min  ?Stress: Not on file  ?Social Connections: Not on file  ?Intimate Partner Violence: Not on file  ? ? ?Family History  ?Problem Relation Age of Onset  ? Sudden Cardiac Death Maternal Grandfather   ? ? ?Past Surgical History:  ?Procedure Laterality Date  ? CHOLECYSTECTOMY    ? CORONARY BALLOON ANGIOPLASTY N/A 07/03/2019  ? Procedure: CORONARY BALLOON ANGIOPLASTY;  Surgeon: Nelva Bush, MD;  Location: [facility];  Service: Cardiovascular;  Laterality: N/A;  distal lad  ? CORONARY STENT INTERVENTION N/A 07/03/2019  ? Procedure: CORONARY STENT INTERVENTION;  Surgeon: Nelva Bush, MD;  Location: [facility];  Service: Cardiovascular;  Laterality: N/A;  cfx ?lad ?  ? INTRAVASCULAR ULTRASOUND/IVUS N/A 07/03/2019  ? Procedure: Intravascular Ultrasound/IVUS;  Surgeon: Nelva Bush, MD;  Location: [facility];  Service: Cardiovascular;  Laterality: N/A;  ? LEFT HEART CATH AND CORONARY ANGIOGRAPHY N/A 07/03/2019  ? Procedure: LEFT HEART CATH AND CORONARY ANGIOGRAPHY;  Surgeon: Nelva Bush, MD;  Location: [facility];  Service: Cardiovascular;  Laterality: N/A;  ? TUBAL LIGATION    ? ? ?ROS: ?Review of Systems ?Negative except as stated above ? ?PHYSICAL EXAM: ?BP 125/82   Pulse 98   Resp 16   Wt 186 lb 6.4 oz (84.6 kg)   SpO2 96%   BMI 33.55 kg/m?   ?Physical  Exam ? ?General appearance - alert, well appearing, middle-age Caucasian female and in no distress ?Mental status - normal mood, behavior, speech, dress, motor activity, and thought processes ?CVS: Regular rate rhythm ?Chest: Clear to auscultation ?Musculoskeletal -right hip: Mild to moderate tenderness on palpation over the trochanteric bursa.  She has moderate discomfort with passive range of motion in all directions. ?Right foot: She has mild callus buildup around the heel.  Over the medial to lateral aspect of the callus, she has some underlying discoloration of the skin suggesting a bruise/contusion.  It is tender to touch.  No fluctuance appreciated. ? ? ? ?  Latest Ref Rng & Units 08/09/2020  ?  3:57 PM 09/01/2019  ? 10:29 AM 08/16/2019  ? 11:40 AM  ?CMP  ?Glucose 65 - 99 mg/dL 92    87    ?BUN 6 - 20 mg/dL 10    10    ?Creatinine 0.57 - 1.00 mg/dL 0.75    0.74    ?Sodium 134 - 144 mmol/L 141    139    ?Potassium 3.5 - 5.2 mmol/L 4.3    4.6    ?Chloride 96 - 106 mmol/L 103    102    ?CO2 20 - 29 mmol/L 22    23    ?Calcium 8.7 - 10.2 mg/dL 9.7    9.9    ?Total Protein 6.0 - 8.5 g/dL 7.2   6.9     ?Total Bilirubin 0.0 - 1.2 mg/dL 0.4   0.7     ?Alkaline Phos 44 - 121 IU/L 76   90     ?AST 0 - 40 IU/L 20   29     ?ALT 0 - 32 IU/L 20   25     ? ?Lipid Panel  ?   ?Component Value Date/Time  ? CHOL 131 09/27/2020 0904  ? TRIG 74 09/27/2020 0904  ? HDL 31 (L) 09/27/2020 0904  ? CHOLHDL 4.2 09/27/2020 0904  ? CHOLHDL 6.1 07/04/2019 0537  ? VLDL 17 07/04/2019 0537  ? [place] 85 09/27/2020 0904  ? ? ?CBC ?   ?Component Value Date/Time  ? WBC 10.5 08/09/2020 1557  ? WBC 11.4 (H) 07/04/2019 0537  ? RBC 4.39 08/09/2020 1557  ? RBC 3.60 (L) 07/04/2019 0537  ? HGB 14.4 08/09/2020 1557  ? HCT 42.0 08/09/2020 1557  ? PLT 284 08/09/2020 1557  ? MCV 96 08/09/2020 1557  ? MCH 32.8 08/09/2020 1557  ? MCH 33.1 07/04/2019 0537  ? MCHC 34.3 08/09/2020 1557  ? MCHC 33.8 07/04/2019 0537  ? RDW 12.8 08/09/2020 1557  ? ? ?ASSESSMENT AND  PLAN: ?1. Chronic pain of right hip ?Differential diagnosis include trochanteric bursitis, OA of the hip joint, avascular necrosis of the hip joint though this seems more unlikely. ?Recommend getting x-rays and r

## 2021-05-16 NOTE — Patient Instructions (Signed)
Please call and schedule an appointment with your cardiologist for evaluation of palpitations. ?

## 2021-05-17 LAB — CBC
Hematocrit: 43.1 % (ref 34.0–46.6)
Hemoglobin: 14.6 g/dL (ref 11.1–15.9)
MCH: 32.7 pg (ref 26.6–33.0)
MCHC: 33.9 g/dL (ref 31.5–35.7)
MCV: 96 fL (ref 79–97)
Platelets: 281 10*3/uL (ref 150–450)
RBC: 4.47 x10E6/uL (ref 3.77–5.28)
RDW: 13 % (ref 11.7–15.4)
WBC: 9.5 10*3/uL (ref 3.4–10.8)

## 2021-05-17 LAB — TSH: TSH: 0.782 u[IU]/mL (ref 0.450–4.500)

## 2021-05-20 ENCOUNTER — Encounter: Payer: Self-pay | Admitting: Orthopaedic Surgery

## 2021-05-20 ENCOUNTER — Ambulatory Visit (INDEPENDENT_AMBULATORY_CARE_PROVIDER_SITE_OTHER): Payer: Medicaid Other | Admitting: Orthopaedic Surgery

## 2021-05-20 DIAGNOSIS — G8929 Other chronic pain: Secondary | ICD-10-CM | POA: Insufficient documentation

## 2021-05-20 DIAGNOSIS — M25551 Pain in right hip: Secondary | ICD-10-CM | POA: Diagnosis not present

## 2021-05-20 MED ORDER — METHYLPREDNISOLONE ACETATE 40 MG/ML IJ SUSP
40.0000 mg | INTRAMUSCULAR | Status: AC | PRN
  Administered 2021-05-20: 40 mg via INTRA_ARTICULAR

## 2021-05-20 MED ORDER — LIDOCAINE HCL 1 % IJ SOLN
3.0000 mL | INTRAMUSCULAR | Status: AC | PRN
  Administered 2021-05-20: 3 mL

## 2021-05-20 MED ORDER — BUPIVACAINE HCL 0.5 % IJ SOLN
3.0000 mL | INTRAMUSCULAR | Status: AC | PRN
  Administered 2021-05-20: 3 mL via INTRA_ARTICULAR

## 2021-05-20 NOTE — Progress Notes (Signed)
? ?Office Visit Note ?  ?Patient: Grace Castillo           ?Date of Birth: 02/05/1981           ?MRN: [id number] ?Visit Date: 05/20/2021 ?             ?Requested by: Marcine Matar, MD ?[address] ?[address] ?[place],  [place] [address] ?PCP: Marcine Matar, MD ? ? ?Assessment & Plan: ?Visit Diagnoses:  ?1. Chronic right hip pain   ? ? ?Plan: Impression is chronic lateral right hip pain.  Findings reviewed with the patient.  X-rays were also reviewed which were negative for structural abnormalities and well-preserved joint space.  Treatment options discussed with the patient and she would like to undergo cortisone injection as well as home exercise program.  May need to consider MRI of the lumbar spine and/or the hip if she continues to have her symptoms. ? ?Follow-Up Instructions: No follow-ups on file.  ? ?Orders:  ?No orders of the defined types were placed in this encounter. ? ?No orders of the defined types were placed in this encounter. ? ? ? ? Procedures: ?Large Joint Inj: R greater trochanter on 05/20/2021 2:35 PM ?Indications: pain ?Details: 22 G needle ? ?Arthrogram: No ? ?Medications: 3 mL lidocaine 1 %; 3 mL bupivacaine 0.5 %; 40 mg methylPREDNISolone acetate 40 MG/ML ?Patient was prepped and draped in the usual sterile fashion.  ? ? ? ? ?Clinical Data: ?No additional findings. ? ? ?Subjective: ?Chief Complaint  ?Patient presents with  ? Right Hip - Pain  ? Lower Back - Pain  ? ? ?HPI ? ?Grace Castillo is a 41 year old female here for chronic right hip pain mainly to the lateral side with radiation of numbness tingling down into the toes at times.  Occasional groin pain.  Denies any injuries.  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  She has trouble with steps and with laying on her right side. ? ?Review of Systems  ?Constitutional: Negative.   ?HENT: Negative.    ?Eyes: Negative.   ?Respiratory: Negative.    ?Cardiovascular: Negative.   ?Endocrine: Negative.   ?Musculoskeletal:  Negative.   ?Neurological: Negative.   ?Hematological: Negative.   ?Psychiatric/Behavioral: Negative.    ?All other systems reviewed and are negative. ? ? ?Objective: ?Vital Signs: There were no vitals taken for this visit. ? ?Physical Exam ?Vitals and nursing note reviewed.  ?Constitutional:   ?   Appearance: She is well-developed.  ?HENT:  ?   Head: Normocephalic and atraumatic.  ?Pulmonary:  ?   Effort: Pulmonary effort is normal.  ?Abdominal:  ?   Palpations: Abdomen is soft.  ?Musculoskeletal:  ?   Cervical back: Neck supple.  ?Skin: ?   General: Skin is warm.  ?   Capillary Refill: Capillary refill takes less than 2 seconds.  ?Neurological:  ?   Mental Status: She is alert and oriented to person, place, and time.  ?Psychiatric:     ?   Behavior: Behavior normal.     ?   Thought Content: Thought content normal.     ?   Judgment: Judgment normal.  ? ? ?Ortho Exam ? ?Examination of the right hip shows excellent range of motion.  Tenderness to the lateral aspect of the hip.  No sciatic tension signs.  Normal strength with manual muscle testing of the hip.  She has pain to the lateral side of the hip with manual muscle testing. ? ?  Specialty Comments:  ?No specialty comments available. ? ?Imaging: ?No results found. ? ? ?PMFS History: ?Patient Active Problem List  ? Diagnosis Date Noted  ? Chronic right hip pain 05/20/2021  ? Hyperlipidemia 11/22/2020  ? Tobacco use 11/22/2020  ? Depression 11/22/2020  ? Multiple sclerosis ([facility]) 11/22/2020  ? CAD (coronary artery disease) 07/05/2019  ? Non-ST elevation (NSTEMI) myocardial infarction ([facility]) 07/05/2019  ? Unstable angina ([facility]) 07/03/2019  ? Ventricular tachycardia ([facility])   ? ?Past Medical History:  ?Diagnosis Date  ? Hyperlipidemia   ? Hypertension   ? MS (multiple sclerosis) ([facility])   ? NSTEMI (non-ST elevated myocardial infarction) ([facility])   ? NSVT (nonsustained ventricular tachycardia) ([facility]) 07/02/2019  ? S/P angioplasty with stent   ? DES to LAD and Lcx   ?  ?Family  History  ?Problem Relation Age of Onset  ? Sudden Cardiac Death Maternal Grandfather   ?  ?Past Surgical History:  ?Procedure Laterality Date  ? CHOLECYSTECTOMY    ? CORONARY BALLOON ANGIOPLASTY N/A 07/03/2019  ? Procedure: CORONARY BALLOON ANGIOPLASTY;  Surgeon: Yvonne Kendall, MD;  Location: [facility];  Service: Cardiovascular;  Laterality: N/A;  distal lad  ? CORONARY STENT INTERVENTION N/A 07/03/2019  ? Procedure: CORONARY STENT INTERVENTION;  Surgeon: Yvonne Kendall, MD;  Location: [facility];  Service: Cardiovascular;  Laterality: N/A;  cfx ?lad ?  ? INTRAVASCULAR ULTRASOUND/IVUS N/A 07/03/2019  ? Procedure: Intravascular Ultrasound/IVUS;  Surgeon: Yvonne Kendall, MD;  Location: [facility];  Service: Cardiovascular;  Laterality: N/A;  ? LEFT HEART CATH AND CORONARY ANGIOGRAPHY N/A 07/03/2019  ? Procedure: LEFT HEART CATH AND CORONARY ANGIOGRAPHY;  Surgeon: Yvonne Kendall, MD;  Location: [facility];  Service: Cardiovascular;  Laterality: N/A;  ? TUBAL LIGATION    ? ?Social History  ? ?Occupational History  ? Not on file  ?Tobacco Use  ? Smoking status: Every Day  ?  Packs/day: 1.00  ?  Types: Cigarettes  ? Smokeless tobacco: Never  ?Substance and Sexual Activity  ? Alcohol use: Never  ? Drug use: Yes  ?  Types: Marijuana  ? Sexual activity: Yes  ? ? ? ? ? ? ?

## 2021-05-28 ENCOUNTER — Telehealth: Payer: Self-pay | Admitting: Orthopaedic Surgery

## 2021-05-28 NOTE — Telephone Encounter (Signed)
Injection can take up to two weeks to kick in, but ok to go ahead and schedule pelvis and lumbar spine mri.  If injection starts working, I would probably cancel mri

## 2021-05-28 NOTE — Telephone Encounter (Signed)
Pt called and states injection did not help and would like to get an mri schled.  ? ?CB [phone] ?

## 2021-05-29 ENCOUNTER — Other Ambulatory Visit: Payer: Self-pay

## 2021-05-29 DIAGNOSIS — G8929 Other chronic pain: Secondary | ICD-10-CM

## 2021-05-29 DIAGNOSIS — M545 Low back pain, unspecified: Secondary | ICD-10-CM

## 2021-05-29 MED ORDER — TRAMADOL HCL 50 MG PO TABS: 50.0000 mg | ORAL_TABLET | Freq: Two times a day (BID) | ORAL | 0 refills | Status: DC | PRN

## 2021-05-29 NOTE — Telephone Encounter (Signed)
Called and LMOM 

## 2021-05-29 NOTE — Telephone Encounter (Signed)
Pharmacy will contact patient when ready for pick up.

## 2021-05-29 NOTE — Telephone Encounter (Signed)
Can you send in tramadol 50 1 every 12 hours prn # 30

## 2021-05-29 NOTE — Telephone Encounter (Signed)
Called patient. She is aware. Would like something for pain in the meantime. Please send into [facility]  in [place] ?

## 2021-05-30 ENCOUNTER — Encounter: Payer: Self-pay | Admitting: Podiatry

## 2021-05-30 ENCOUNTER — Ambulatory Visit (INDEPENDENT_AMBULATORY_CARE_PROVIDER_SITE_OTHER): Payer: Medicaid Other | Admitting: Podiatry

## 2021-05-30 ENCOUNTER — Ambulatory Visit (INDEPENDENT_AMBULATORY_CARE_PROVIDER_SITE_OTHER): Payer: Medicaid Other

## 2021-05-30 ENCOUNTER — Other Ambulatory Visit: Payer: Self-pay | Admitting: *Deleted

## 2021-05-30 DIAGNOSIS — S9031XA Contusion of right foot, initial encounter: Secondary | ICD-10-CM

## 2021-05-30 IMAGING — DX DG FOOT COMPLETE 3+V*R*
3 series · 3 of 3 positions shown · non-contrast
Comparison: None.

CLINICAL DATA: Right heel pain with contusion without injury.

EXAM:
RIGHT FOOT COMPLETE - 3+ VIEW

[foot ap wb]
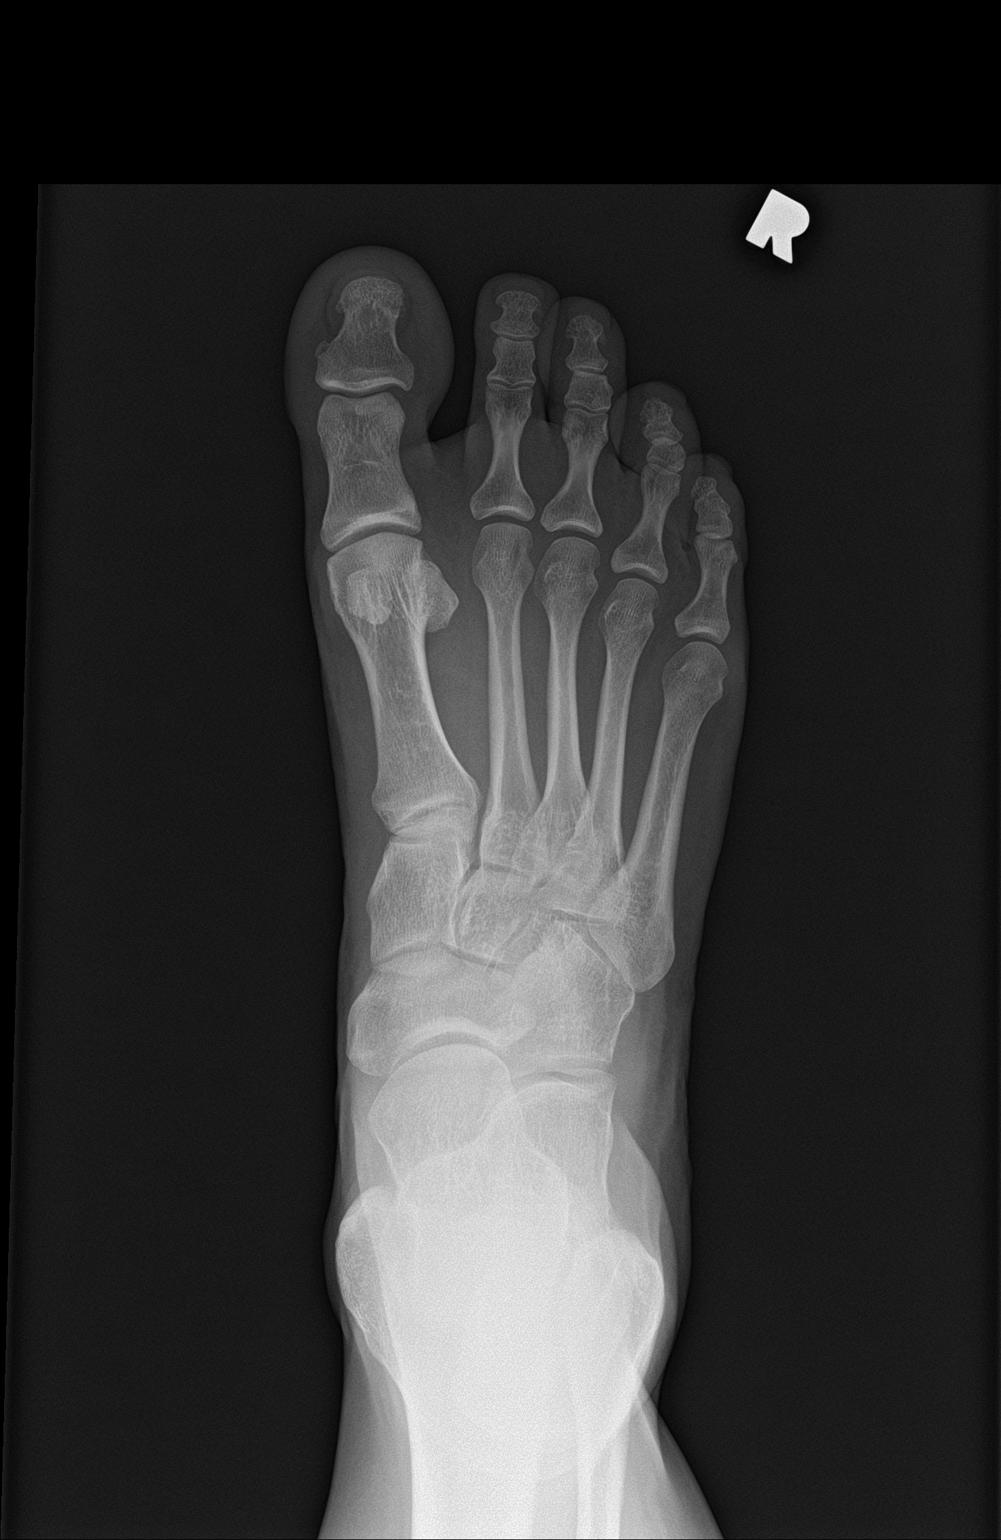

[foot obl wb]
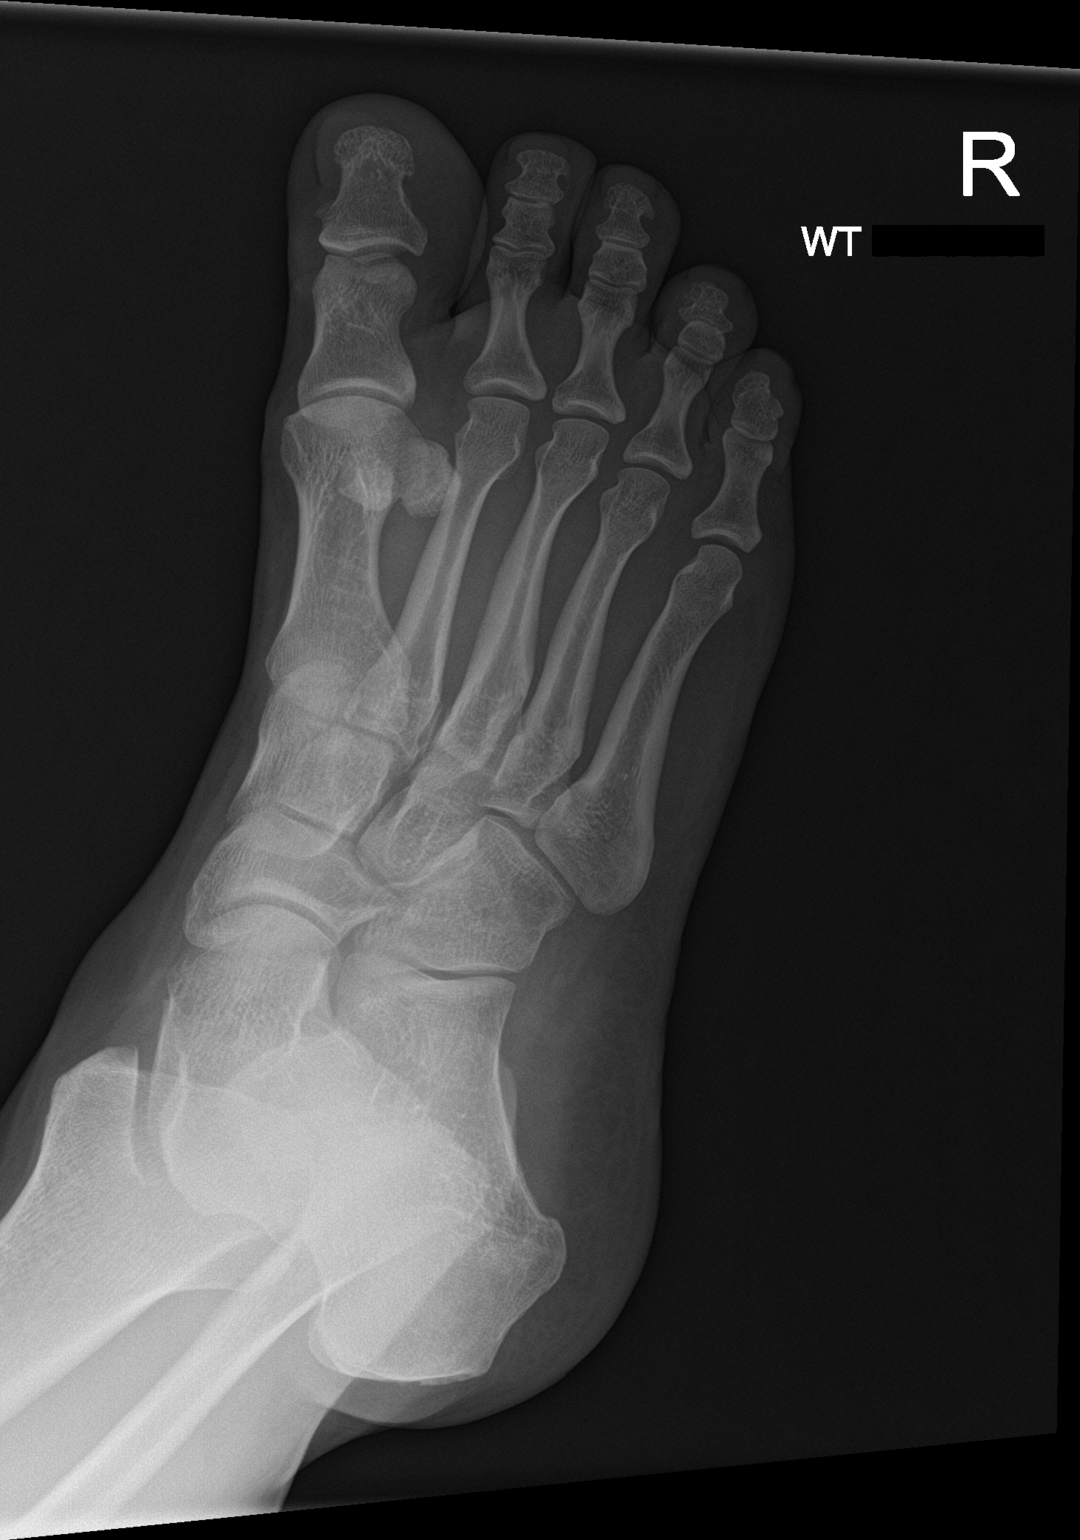

[foot lat wb]
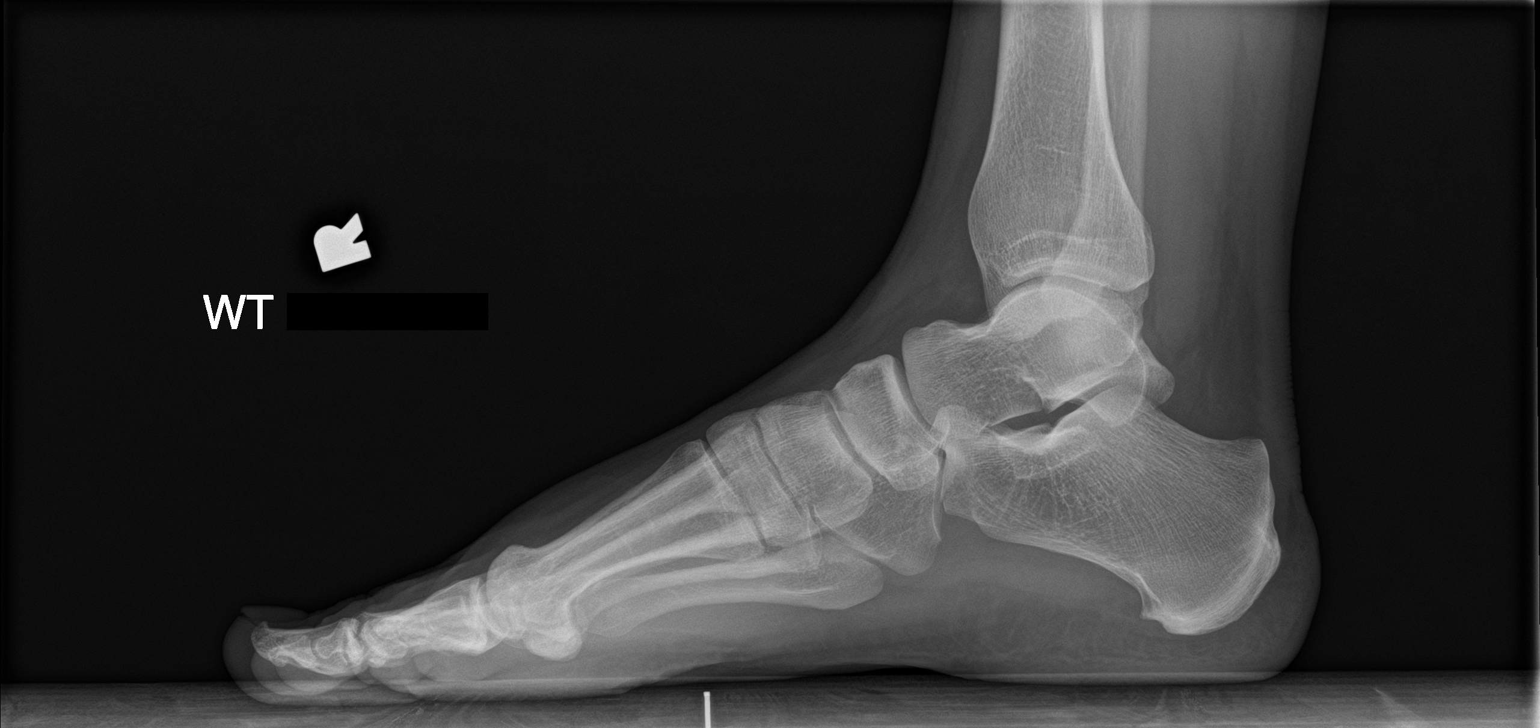

[3 of 3 positions shown; findings below may reference images not displayed]

FINDINGS: Tiny plantar calcaneal heel spur. Normal bone mineralization. Joint
spaces are preserved. No acute fracture or dislocation.
IMPRESSION: Tiny plantar calcaneal heel spur.

## 2021-05-30 NOTE — Progress Notes (Signed)
?  Subjective:  ?Patient ID: Monserrate Blaschke, female    DOB: 30-Jul-1980,   MRN: [id number] ? ?No chief complaint on file. ? ? ?41 y.o. female presents for concern of right foot pain. Relates most of the pain is on the back of her heel. Relates there is a dark color around the area. Relates she can wear shoes or put any pressure on this spot. Relates burning soreness and numbness. Relates she gets leg cramps as well and has difficulty walking. She does have a history of MS.  . Denies any other pedal complaints. Denies n/v/f/c.  ? ?Past Medical History:  ?Diagnosis Date  ? Hyperlipidemia   ? Hypertension   ? MS (multiple sclerosis) ([facility])   ? NSTEMI (non-ST elevated myocardial infarction) ([facility])   ? NSVT (nonsustained ventricular tachycardia) ([facility]) 07/02/2019  ? S/P angioplasty with stent   ? DES to LAD and Lcx   ? ? ?Objective:  ?Physical Exam: ?Vascular: DP/PT pulses 2/4 bilateral. CFT <3 seconds. Normal hair growth on digits. No edema.  ?Skin. No lacerations or abrasions bilateral feet. Echycmosis noted to distal right heel with tenderness to palpation. Mild hyperkeratosis overlying.  ?Musculoskeletal: MMT 5/5 bilateral lower extremities in DF, PF, Inversion and Eversion. Deceased ROM in DF of ankle joint. Tender to palpation over bruised looking area and tenderness to posterior plantar heel. No pain with ROM of ankle or subtalar joint.  ?Neurological: Sensation intact to light touch.  ? ?Assessment:  ? ?1. Right foot pain   ? ? ? ?Plan:  ?Patient was evaluated and treated and all questions answered. ?X-rays reviewed and discussed with patient. No acute fractures or dislocations noted. Mild spurring noted to posterior calcaneus and haglund's deformity noted.  ?Discussed contusion vs callus vs other skin condition vs radiculopathy causing pain in this area.  ?Provided padding to offload this spot.  ?Will continue to follow and if she has any worsening pain or more spreading or discoloration she should come back in to be  seen.  ?Follow-up in 4 weeks for re-evaluation.   ? ? ?Louann Sjogren, DPM  ? ? ?

## 2021-06-07 ENCOUNTER — Ambulatory Visit
Admission: RE | Admit: 2021-06-07 | Discharge: 2021-06-07 | Disposition: A | Discharge status: Home / Self Care | Payer: Medicaid Other | Source: Ambulatory Visit | Attending: Physician Assistant | Admitting: Physician Assistant

## 2021-06-07 DIAGNOSIS — G8929 Other chronic pain: Secondary | ICD-10-CM

## 2021-06-07 DIAGNOSIS — M545 Low back pain, unspecified: Secondary | ICD-10-CM

## 2021-06-07 IMAGING — MR MR PELVIS W/O CM
5 series · 48 of 48 positions shown · non-contrast
Comparison: None.

CLINICAL DATA: Lower back pain radiating to the right hip. Right
hip pop in [DATE] months ago.

EXAM:
MRI PELVIS WITHOUT CONTRAST
TECHNIQUE: Multiplanar multisequence MR imaging of the pelvis was performed. No
intravenous contrast was administered.

[Series 3: STIR · coronal · right · 3.0mm · 1.25mm/px · 12 of 53 slices shown]
[im 1/53]
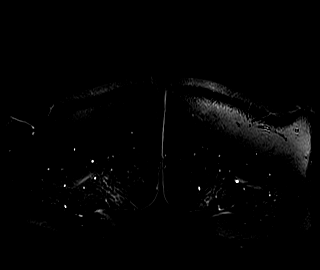
[im 5/53]
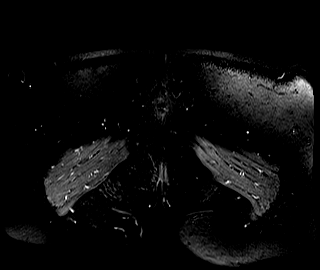
[im 10/53]
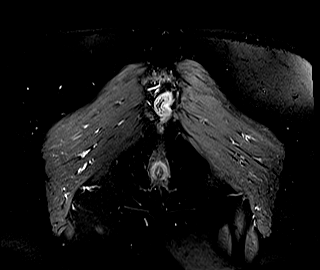
[im 15/53]
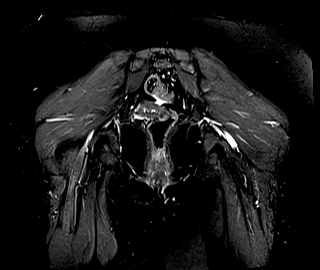
[im 19/53]
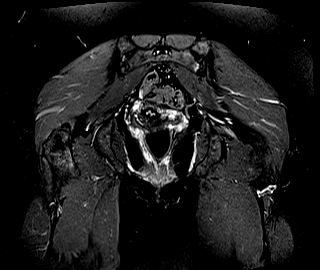
[im 24/53]
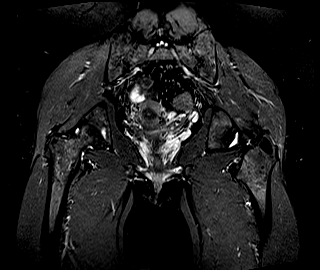
[im 29/53]
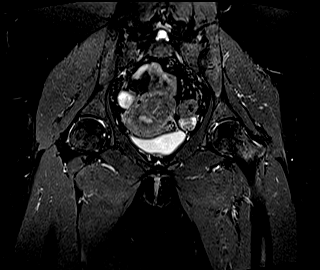
[im 34/53]
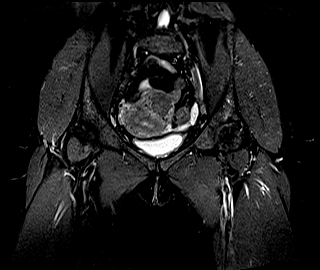
[im 38/53]
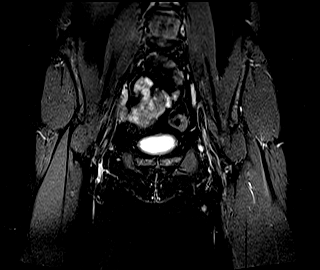
[im 43/53]
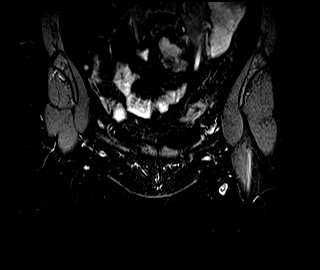
[im 48/53]
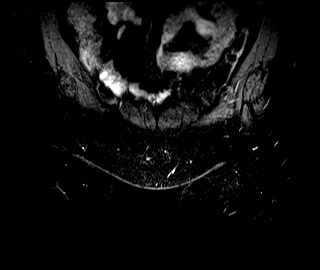
[im 53/53]
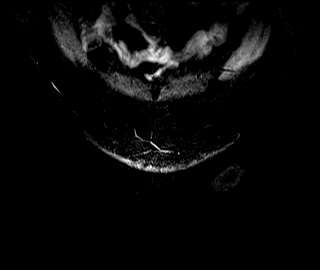

[Series 4: T1 · coronal · right · 3.0mm · 1.25mm/px · 12 of 53 slices shown (1 of 2)]
[im 1/53]
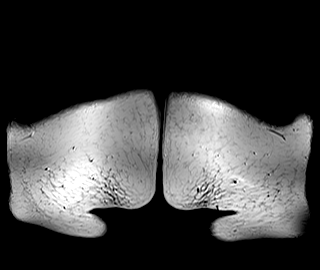
[im 5/53]
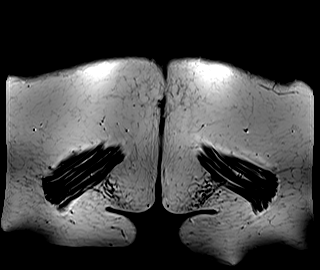
[im 10/53]
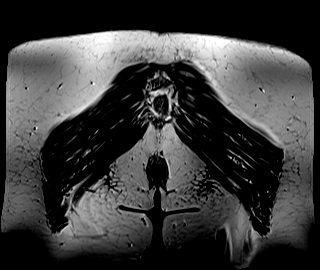
[im 15/53]
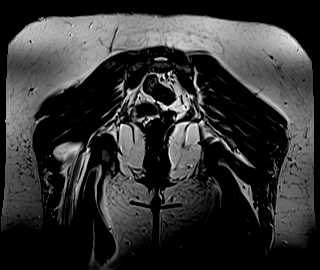
[im 19/53]
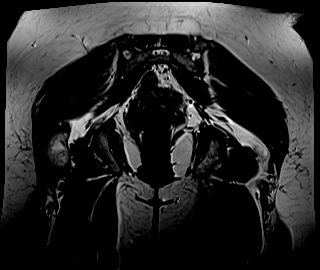
[im 24/53]
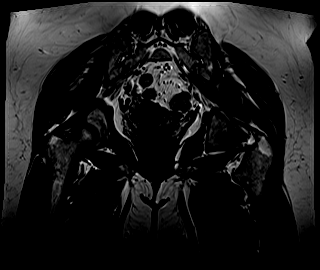
[im 29/53]
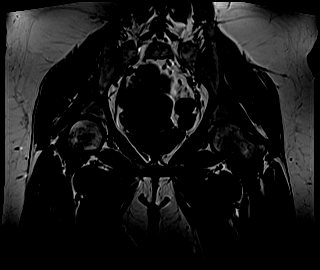
[im 34/53]
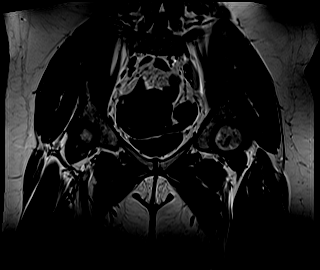
[im 38/53]
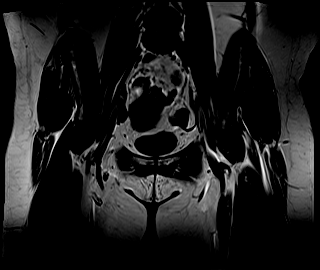
[im 43/53]
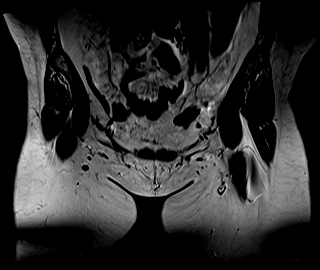
[im 48/53]
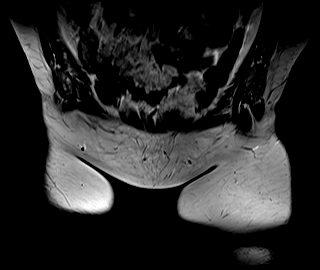
[im 53/53]
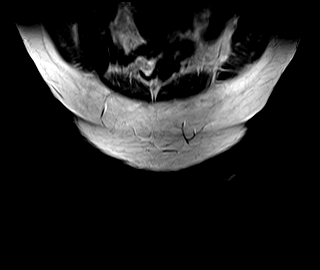

[Series 5: T1 · axial · right · 5.0mm · 1.04mm/px · z∈[-137,+100]mm · 7 of 35 slices shown (2 of 2)]
[im 1/35]
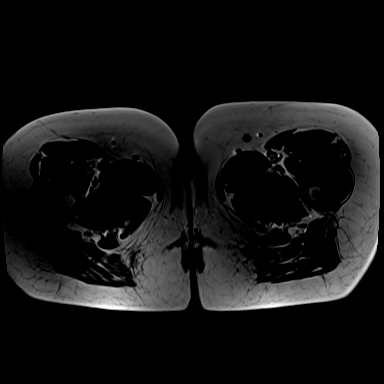
[im 6/35]
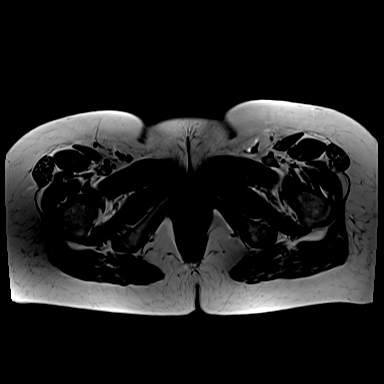
[im 12/35]
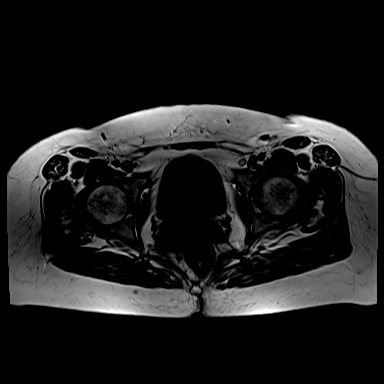
[im 18/35]
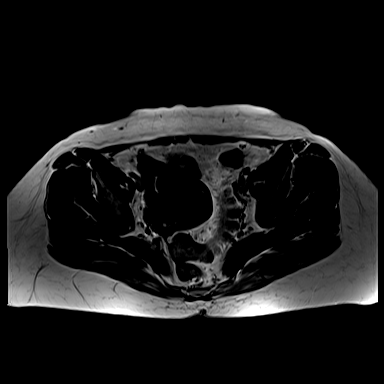
[im 23/35]
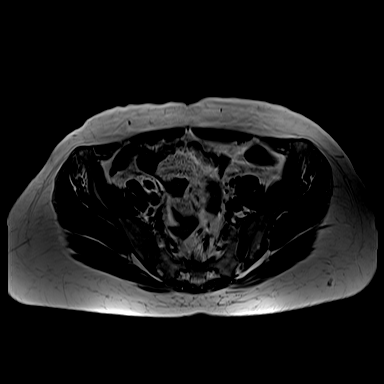
[im 29/35]
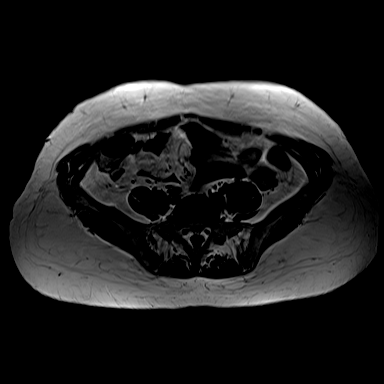
[im 35/35]
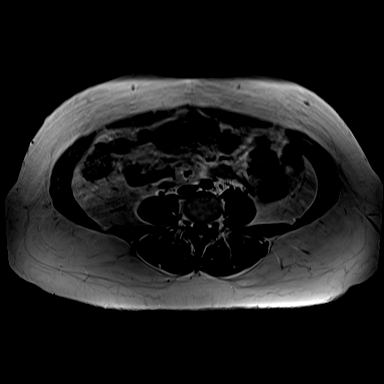

[Series 6: T2 fat-sat · axial · right · 5.0mm · 1.25mm/px · z∈[-137,+100]mm · 7 of 35 slices shown (1 of 2)]
[im 1/35]
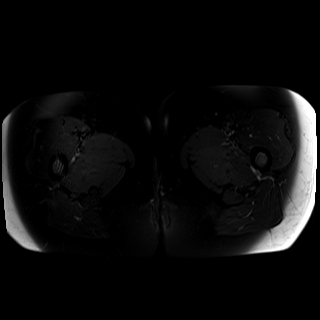
[im 6/35]
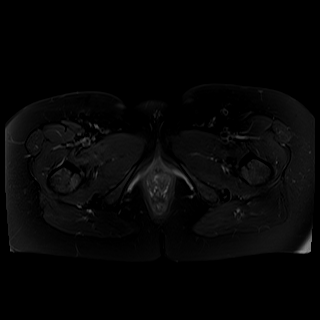
[im 12/35]
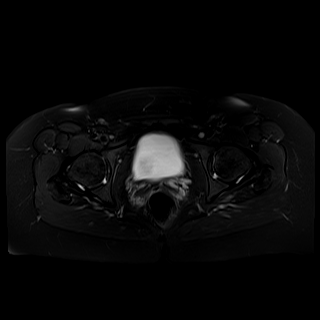
[im 18/35]
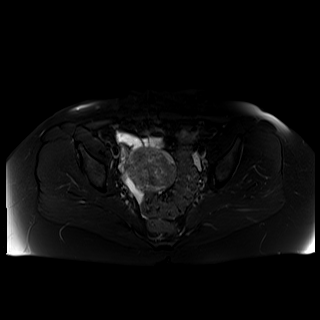
[im 23/35]
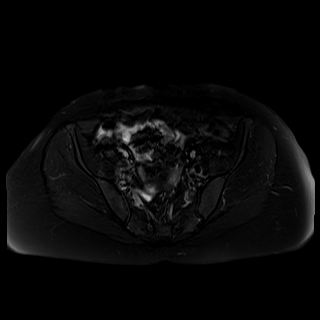
[im 29/35]
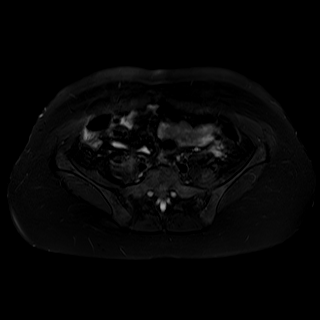
[im 35/35]
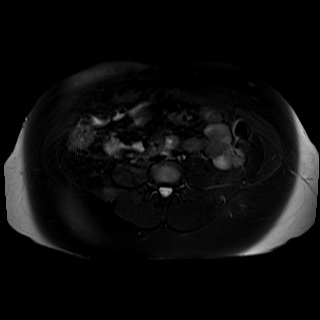

[Series 7: T2 fat-sat · sagittal · right · 5.0mm · 0.97mm/px · 10 of 49 slices shown (2 of 2)]
[im 1/49]
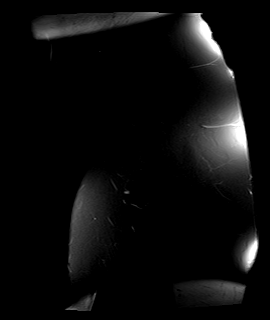
[im 6/49]
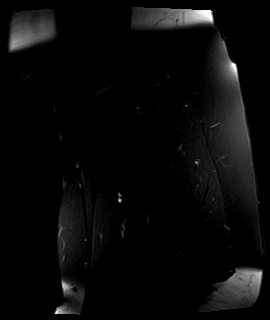
[im 11/49]
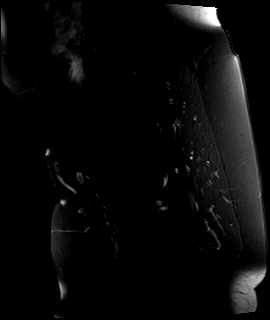
[im 17/49]
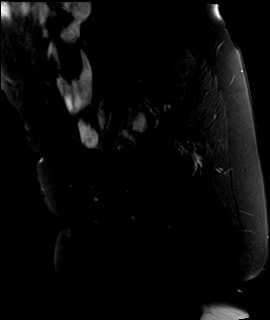
[im 22/49]
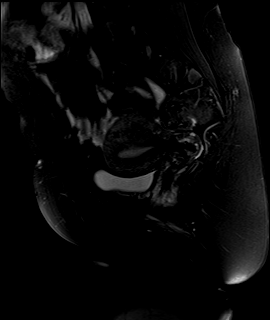
[im 27/49]
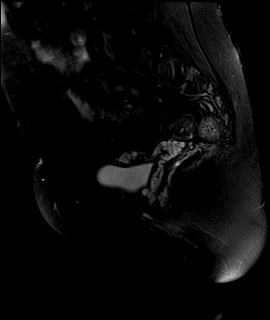
[im 33/49]
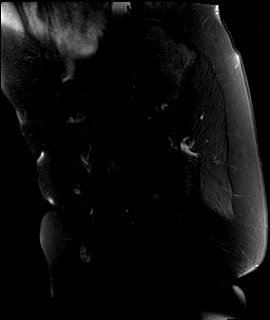
[im 38/49]
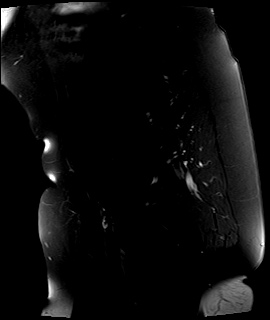
[im 43/49]
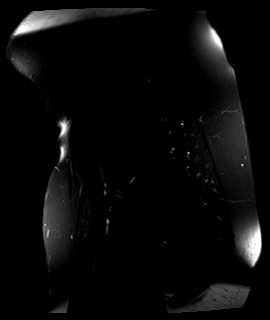
[im 49/49]
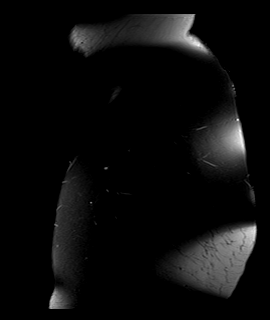

[48 of 48 positions shown; findings below may reference images not displayed]

FINDINGS: Bones:

No hip fracture, dislocation or avascular necrosis.

No periosteal reaction or bone destruction. No aggressive osseous
lesion.

Normal sacrum and sacroiliac joints. No SI joint widening or erosive
changes.

Articular cartilage and labrum

Articular cartilage: Articular cartilage thinning without evidence
of full-thickness defect.

Labrum: Grossly intact, but evaluation is limited by lack of
intraarticular fluid.

Joint or bursal effusion

Joint effusion:  No hip joint effusion.  No SI joint effusion.

Bursae:  No bursa formation.

Muscles and tendons

Flexors: Normal.

Extensors: Normal.

Abductors: Normal.

Adductors: Normal.

Gluteals: Normal.

Hamstrings: Normal.

Other findings

No pelvic free fluid. No fluid collection or hematoma. No inguinal
lymphadenopathy. No inguinal hernia. There is a fibroid uterus.
IMPRESSION: 1.  No evidence of sacroiliitis.

2.  No significant hip arthropathy or joint effusion.

3.  No evidence of tendinopathy or muscle strain.

4.  Fibroid uterus.

## 2021-06-07 IMAGING — MR MR LUMBAR SPINE W/O CM
4 of 5 series · 26 of 48 positions shown · non-contrast
Comparison: None.

CLINICAL DATA: LBP radiating to right hip down bilateral legs
Numbness/weakness in bilateral legs

EXAM:
MRI LUMBAR SPINE WITHOUT CONTRAST
TECHNIQUE: Multiplanar, multisequence MR imaging of the lumbar spine was
performed. No intravenous contrast was administered.

[Series 5: T2 · sagittal · 4.0mm · 0.94mm/px · 6 of 15 slices shown (1 of 2)]
[im 1/15]
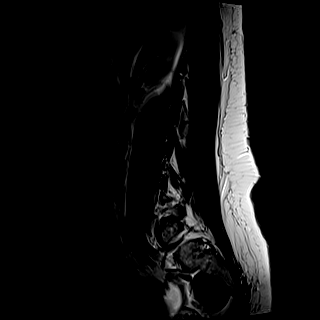
[im 3/15]
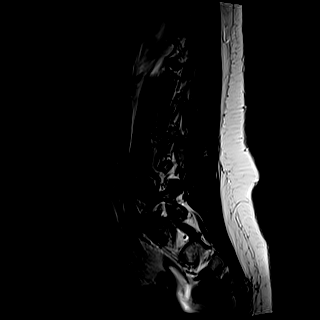
[im 6/15]
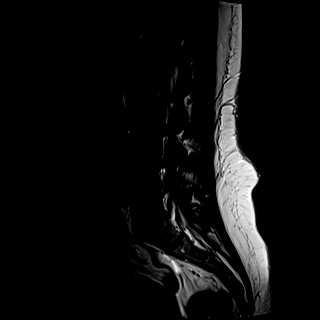
[im 9/15]
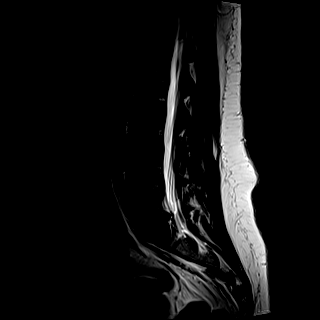
[im 12/15]
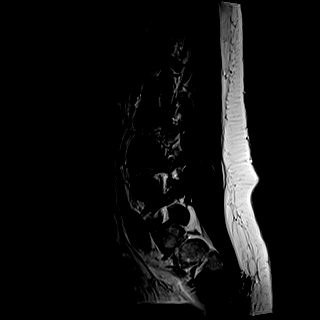
[im 15/15]
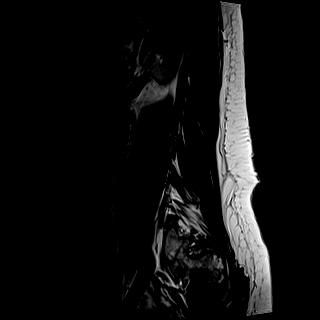

[Series 6: T1 · sagittal · 4.0mm · 0.94mm/px · 6 of 15 slices shown (1 of 2)]
[im 1/15]
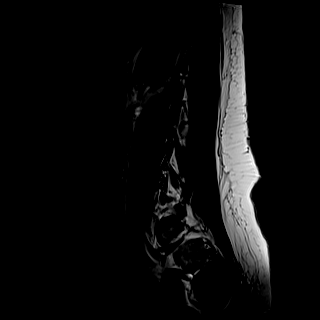
[im 3/15]
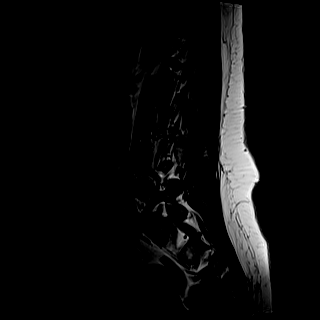
[im 6/15]
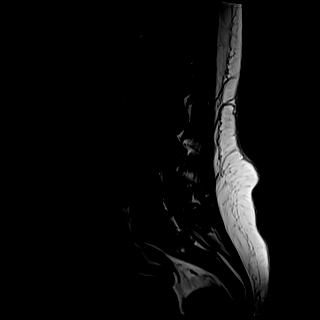
[im 9/15]
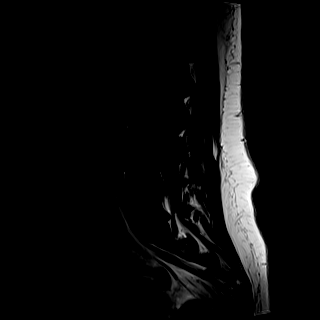
[im 12/15]
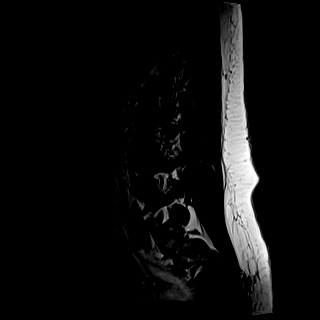
[im 15/15]
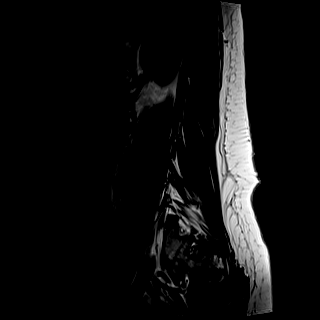

[Series 10: T1 · axial · 4.0mm · 0.35mm/px · z∈[-22,+155]mm · 5 of 40 slices shown (2 of 2)]
[im 1/40]
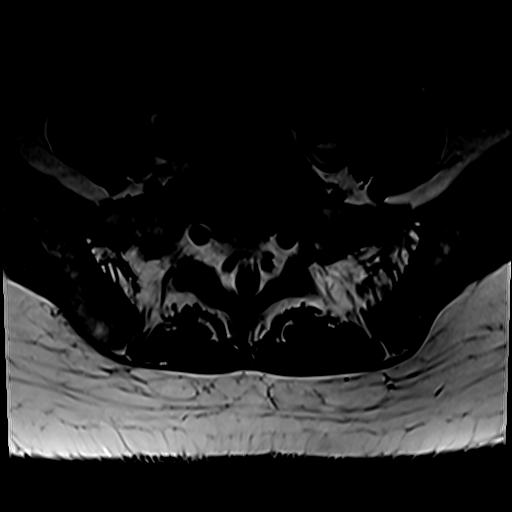
[im 6/40]
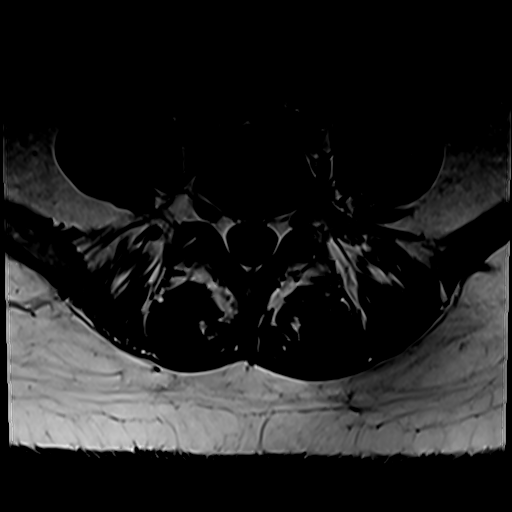
[im 12/40]
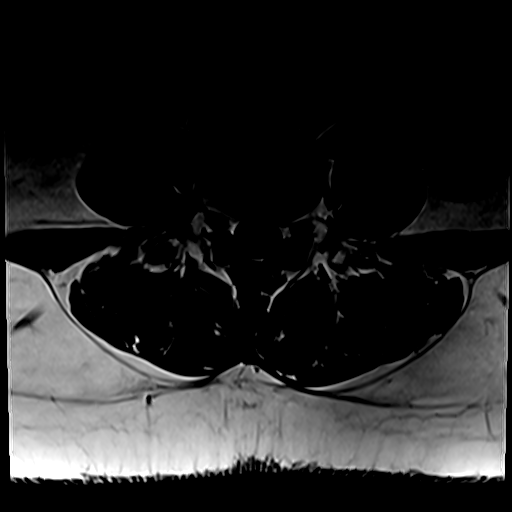
[im 20/40]
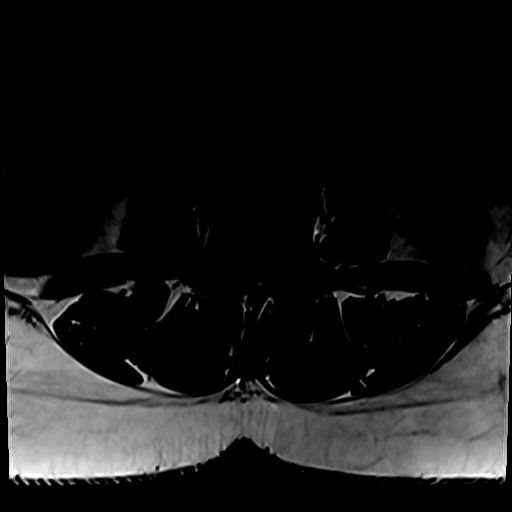
[im 34/40]
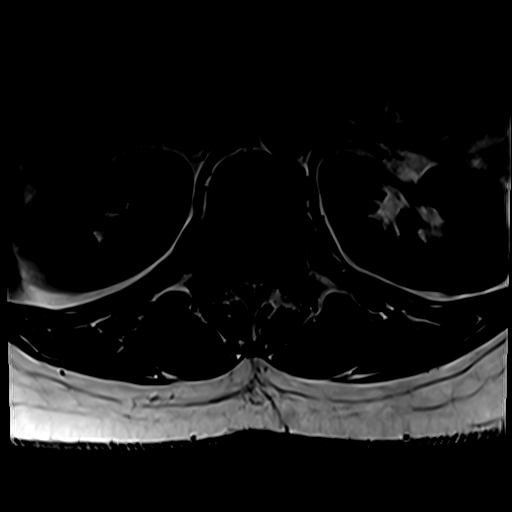

[Series 13: T2 · axial · 4.0mm · 0.35mm/px · z∈[-22,+184]mm · 9 of 40 slices shown (2 of 2)]
[im 1/40]
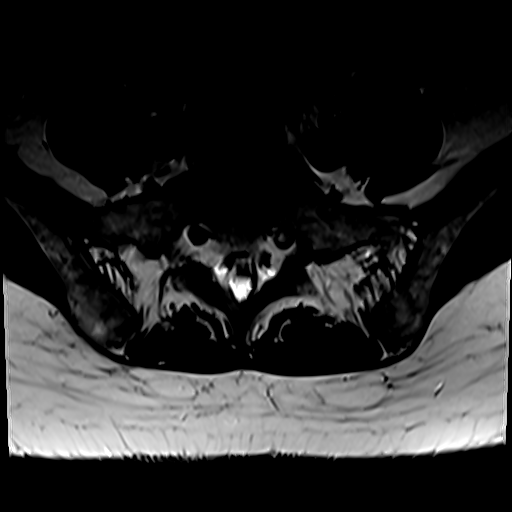
[im 6/40]
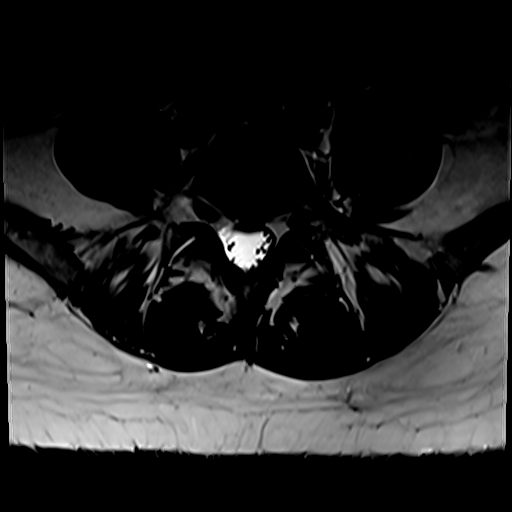
[im 12/40]
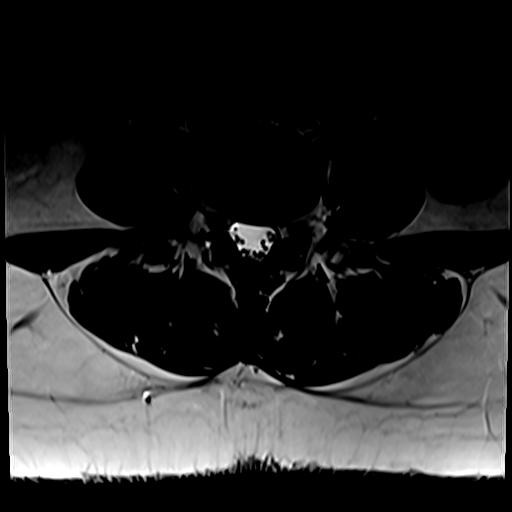
[im 17/40]
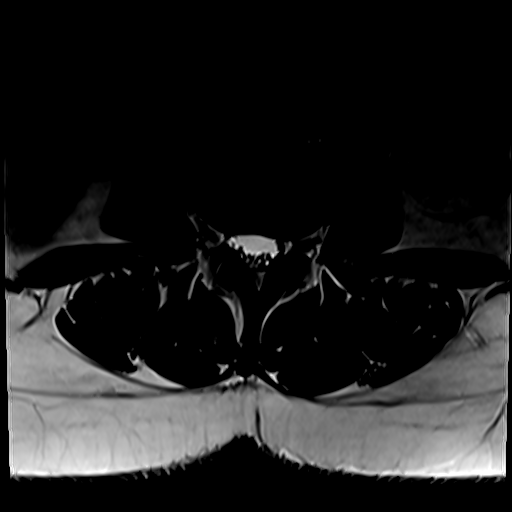
[im 20/40]
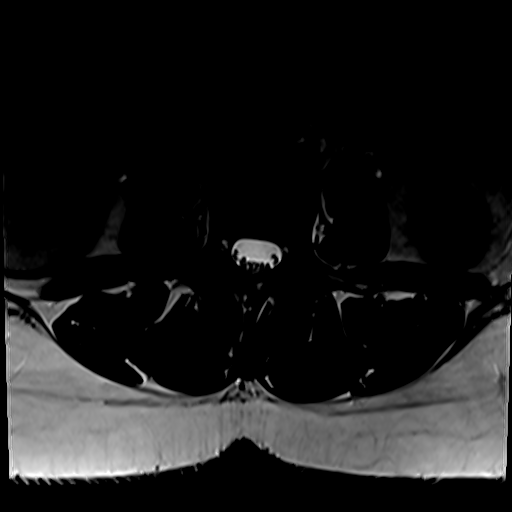
[im 23/40]
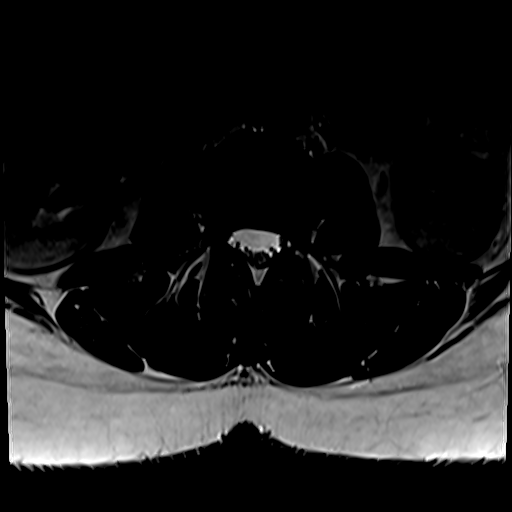
[im 28/40]
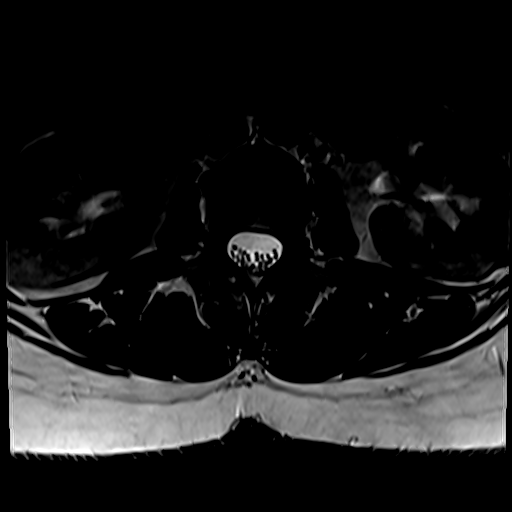
[im 34/40]
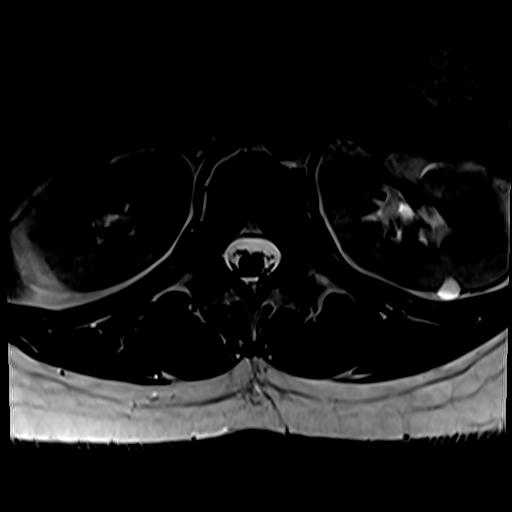
[im 40/40]
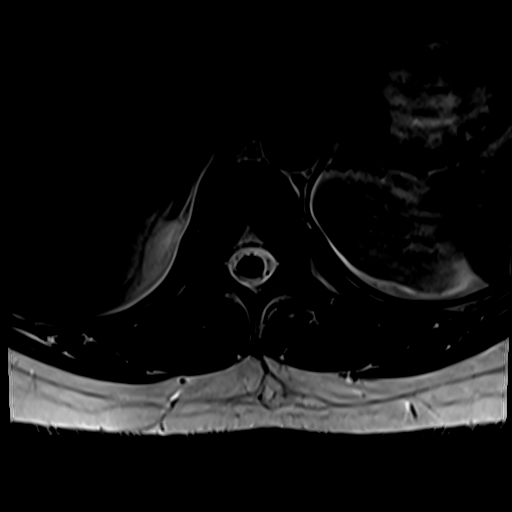

[26 of 48 positions shown; findings below may reference images not displayed]

FINDINGS: Segmentation:  Standard.

Alignment:  Physiologic.

Vertebrae:  No fracture, evidence of discitis, or bone lesion.

Conus medullaris and cauda equina: Conus extends to the L2 level.
Conus and cauda equina appear normal.

Paraspinal and other soft tissues: Negative.

Disc levels:

At L5-S1, there is a small central disc protrusion in close
proximity to the left S1 nerve root in the lateral recess. There is
no central spinal canal or neural foraminal stenosis.
IMPRESSION: 1. Small central disc protrusion at L5-S1 in close proximity to the
left S1 nerve root in the lateral recess. Correlate for left S1
radiculopathy.
2. Otherwise normal lumbar spine.

## 2021-06-09 NOTE — Progress Notes (Signed)
F/u to discuss

## 2021-06-11 NOTE — Progress Notes (Signed)
APPT MADE

## 2021-06-20 ENCOUNTER — Ambulatory Visit (INDEPENDENT_AMBULATORY_CARE_PROVIDER_SITE_OTHER): Payer: Medicaid Other | Admitting: Orthopaedic Surgery

## 2021-06-20 ENCOUNTER — Encounter: Payer: Self-pay | Admitting: Orthopaedic Surgery

## 2021-06-20 DIAGNOSIS — M5416 Radiculopathy, lumbar region: Secondary | ICD-10-CM

## 2021-06-20 MED ORDER — HYDROCODONE-ACETAMINOPHEN 5-325 MG PO TABS: 1.0000 | ORAL_TABLET | Freq: Two times a day (BID) | ORAL | 0 refills | Status: DC | PRN

## 2021-06-20 MED ORDER — PREDNISONE 10 MG (21) PO TBPK: ORAL_TABLET | ORAL | 0 refills | Status: DC

## 2021-06-20 MED ORDER — METHOCARBAMOL 750 MG PO TABS: 750.0000 mg | ORAL_TABLET | Freq: Three times a day (TID) | ORAL | 2 refills | Status: DC | PRN

## 2021-06-20 NOTE — Progress Notes (Signed)
? ?Office Visit Note ?  ?Patient: Grace Castillo           ?Date of Birth: 10-Oct-1980           ?MRN: [id number] ?Visit Date: 06/20/2021 ?             ?Requested by: Marcine Matar, MD ?[address] ?[address] ?[place],  [place] [address] ?PCP: Marcine Matar, MD ? ? ?Assessment & Plan: ?Visit Diagnoses:  ?1. Radiculopathy, lumbar region   ? ? ?Plan: Impression is chronic right sided back, hip and leg pain.  Her symptoms appear to be coming from the lumbar spine which shows disc protrusion L5-S1 likely contacting the S1 nerve root however on the left side.  Her symptoms are on the right, so I would like to refer to Dr. Alvester Morin for diagnostic and hopefully therapeutic ESI.  She will follow-up with [place] as needed. ? ?Follow-Up Instructions: Return if symptoms worsen or fail to improve.  ? ?Orders:  ?No orders of the defined types were placed in this encounter. ? ?Meds ordered this encounter  ?Medications  ? predniSONE (STERAPRED UNI-PAK 21 TAB) 10 MG (21) TBPK tablet  ?  Sig: Take as directed  ?  Dispense:  21 tablet  ?  Refill:  0  ? methocarbamol (ROBAXIN) 750 MG tablet  ?  Sig: Take 1 tablet (750 mg total) by mouth every 8 (eight) hours as needed for muscle spasms.  ?  Dispense:  30 tablet  ?  Refill:  2  ? HYDROcodone-acetaminophen (NORCO) 5-325 MG tablet  ?  Sig: Take 1 tablet by mouth 2 (two) times daily as needed.  ?  Dispense:  20 tablet  ?  Refill:  0  ? ? ? ? Procedures: ?No procedures performed ? ? ?Clinical Data: ?No additional findings. ? ? ?Subjective: ?Chief Complaint  ?Patient presents with  ? Lower Back - Pain  ? ? ?HPI patient is a pleasant 41 year old female who comes in today to discuss MRI results of the pelvis and lumbar spine.  She has been dealing with chronic right-sided low back pain, right hip pain rating down the right leg for a while.  She was seen in our office where the trochanteric bursa was injected with cortisone.  She denies any relief following the injection.  Subsequent MRI of the  pelvis and lumbar spines were obtained.  Pelvis MRI was unremarkable.  Lumbar spine MRI shows small disc protrusion L5-S1 likely contacting the S1 nerve root on the left.  Otherwise unremarkable MRI ? ? ? ? ?Objective: ?Vital Signs: There were no vitals taken for this visit. ? ? ? ?Ortho Exam unchanged exam ? ?Specialty Comments:  ?No specialty comments available. ? ?Imaging: ?No new imaging ? ? ?PMFS History: ?Patient Active Problem List  ? Diagnosis Date Noted  ? Chronic right hip pain 05/20/2021  ? Hyperlipidemia 11/22/2020  ? Tobacco use 11/22/2020  ? Depression 11/22/2020  ? Multiple sclerosis (HCC) 11/22/2020  ? CAD (coronary artery disease) 07/05/2019  ? Non-ST elevation (NSTEMI) myocardial infarction (HCC) 07/05/2019  ? Unstable angina (HCC) 07/03/2019  ? Ventricular tachycardia (HCC)   ? ?Past Medical History:  ?Diagnosis Date  ? Hyperlipidemia   ? Hypertension   ? MS (multiple sclerosis) (HCC)   ? NSTEMI (non-ST elevated myocardial infarction) (HCC)   ? NSVT (nonsustained ventricular tachycardia) (HCC) 07/02/2019  ? S/P angioplasty with stent   ? DES to LAD and Lcx   ?  ?Family History  ?Problem  Relation Age of Onset  ? Sudden Cardiac Death Maternal Grandfather   ?  ?Past Surgical History:  ?Procedure Laterality Date  ? CHOLECYSTECTOMY    ? CORONARY BALLOON ANGIOPLASTY N/A 07/03/2019  ? Procedure: CORONARY BALLOON ANGIOPLASTY;  Surgeon: Yvonne Kendall, MD;  [facility];  Service: Cardiovascular;  Laterality: N/A;  distal lad  ? CORONARY STENT INTERVENTION N/A 07/03/2019  ? Procedure: CORONARY STENT INTERVENTION;  Surgeon: Yvonne Kendall, MD;  [facility]: [facility];  Service: Cardiovascular;  Laterality: N/A;  cfx ?lad ?  ? INTRAVASCULAR ULTRASOUND/IVUS N/A 07/03/2019  ? Procedure: Intravascular Ultrasound/IVUS;  Surgeon: Yvonne Kendall, MD;  [facility];  Service: Cardiovascular;  Laterality: N/A;  ? LEFT HEART CATH AND CORONARY ANGIOGRAPHY N/A 07/03/2019  ?  Procedure: LEFT HEART CATH AND CORONARY ANGIOGRAPHY;  Surgeon: Yvonne Kendall, MD;  [facility];  Service: Cardiovascular;  Laterality: N/A;  ? TUBAL LIGATION    ? ?Social History  ? ?Occupational History  ? Not on file  ?Tobacco Use  ? Smoking status: Every Day  ?  Packs/day: 1.00  ?  Types: Cigarettes  ? Smokeless tobacco: Never  ?Substance and Sexual Activity  ? Alcohol use: Never  ? Drug use: Yes  ?  Types: Marijuana  ? Sexual activity: Yes  ? ? ? ? ? ? ?

## 2021-06-23 ENCOUNTER — Other Ambulatory Visit: Payer: Self-pay

## 2021-06-23 DIAGNOSIS — M545 Low back pain, unspecified: Secondary | ICD-10-CM

## 2021-07-02 ENCOUNTER — Telehealth: Payer: Self-pay | Admitting: Physical Medicine and Rehabilitation

## 2021-07-02 ENCOUNTER — Other Ambulatory Visit: Payer: Self-pay | Admitting: Internal Medicine

## 2021-07-02 DIAGNOSIS — F32A Depression, unspecified: Secondary | ICD-10-CM

## 2021-07-02 NOTE — Telephone Encounter (Signed)
Pt called requesting a sooner appt if possible or be put on a cancellation list. Please call pt about this matter at [phone]. ?

## 2021-07-08 ENCOUNTER — Encounter: Payer: Self-pay | Admitting: Podiatry

## 2021-07-08 ENCOUNTER — Ambulatory Visit: Payer: Medicaid Other | Admitting: Podiatry

## 2021-07-08 DIAGNOSIS — I739 Peripheral vascular disease, unspecified: Secondary | ICD-10-CM

## 2021-07-08 MED ORDER — NITROGLYCERIN 2 % TD OINT: 0.5000 [in_us] | TOPICAL_OINTMENT | Freq: Three times a day (TID) | TRANSDERMAL | 1 refills | Status: DC

## 2021-07-08 NOTE — Progress Notes (Addendum)
HPI: 41 y.o. female presenting today as an urgent work in for concern of discoloration with increased pain to the right foot.  Patient states that within the last few days she has noticed blue discoloration to the right third toe.  She has an area of discoloration already to the right heel lateral aspect.  It is very painful with pressure and she became increasingly concerned when she noticed it as well to the toe.  She denies a history of injury to this area.  She does have a history of MI with stent placement.  She presents for further treatment and evaluation  Past Medical History:  Diagnosis Date   Hyperlipidemia    Hypertension    MS (multiple sclerosis) (HCC)    NSTEMI (non-ST elevated myocardial infarction) (HCC)    NSVT (nonsustained ventricular tachycardia) (HCC) 07/02/2019   S/P angioplasty with stent    DES to LAD and Lcx     Past Surgical History:  Procedure Laterality Date   CHOLECYSTECTOMY     CORONARY BALLOON ANGIOPLASTY N/A 07/03/2019   Procedure: CORONARY BALLOON ANGIOPLASTY;  Surgeon: Yvonne Kendall, MD;  [facility];  Service: Cardiovascular;  Laterality: N/A;  distal lad   CORONARY STENT INTERVENTION N/A 07/03/2019   Procedure: CORONARY STENT INTERVENTION;  Surgeon: Yvonne Kendall, MD;  [facility];  Service: Cardiovascular;  Laterality: N/A;  cfx lad    INTRAVASCULAR ULTRASOUND/IVUS N/A 07/03/2019   Procedure: Intravascular Ultrasound/IVUS;  Surgeon: Yvonne Kendall, MD;  [facility];  Service: Cardiovascular;  Laterality: N/A;   LEFT HEART CATH AND CORONARY ANGIOGRAPHY N/A 07/03/2019   Procedure: LEFT HEART CATH AND CORONARY ANGIOGRAPHY;  Surgeon: Yvonne Kendall, MD;  [facility];  Service: Cardiovascular;  Laterality: N/A;   TUBAL LIGATION      Allergies  Allergen Reactions   Orange Juice [Orange Oil] Hives   Pork-Derived Products Anaphylaxis   Repatha [Evolocumab] Itching    Itching,  welts, body felt on fire   Praluent [Alirocumab] Itching   Sulfa Antibiotics Nausea And Vomiting         Physical Exam: General: The patient is alert and oriented x3 in no acute distress.  Dermatology: Negative for open lesions or macerations.  Dusky bluish discoloration noted to the right third digit with a delayed capillary refill and lateral aspect of the right heel  Vascular: Unable to palpate pedal pulses.  The skin to the right foot and toes is very cold compared to the contralateral limb.  Significantly delayed capillary refill especially to the right third toe.  There is concern for ischemia to the foot  Neurological: Light touch and protective threshold grossly intact  Musculoskeletal Exam: No pedal deformities noted  Assessment: 1.  Pain due to ischemia right third toe and heel   Plan of Care:  1. Patient evaluated. 2.  Order placed for vascular ABIs bilateral 3.  Prescription for nitroglycerin 2% ointment to apply to the third toe and heel to increase circulation to the area 4.  Office will contact the patient in follow-up with results of the ABIs     Felecia Shelling, DPM [facility]  Dr. Felecia Shelling, DPM    [address]  [place], [place] [address]                Office [phone]  Fax [phone]

## 2021-07-10 ENCOUNTER — Encounter (HOSPITAL_COMMUNITY): Payer: Self-pay

## 2021-07-10 ENCOUNTER — Ambulatory Visit (HOSPITAL_COMMUNITY): Admit: 2021-07-10 | Payer: Medicaid Other | Admitting: Vascular Surgery

## 2021-07-10 ENCOUNTER — Ambulatory Visit (HOSPITAL_BASED_OUTPATIENT_CLINIC_OR_DEPARTMENT_OTHER)
Admission: RE | Admit: 2021-07-10 | Discharge: 2021-07-10 | Disposition: A | Discharge status: Home / Self Care | Payer: Medicaid Other | Source: Ambulatory Visit | Attending: Podiatry | Admitting: Podiatry

## 2021-07-10 ENCOUNTER — Other Ambulatory Visit: Payer: Self-pay

## 2021-07-10 ENCOUNTER — Ambulatory Visit (HOSPITAL_COMMUNITY)
Admission: EM | Admit: 2021-07-10 | Discharge: 2021-07-10 | Disposition: A | Discharge status: Home / Self Care | Payer: Medicaid Other | Attending: Emergency Medicine | Admitting: Emergency Medicine

## 2021-07-10 ENCOUNTER — Telehealth: Payer: Self-pay | Admitting: *Deleted

## 2021-07-10 ENCOUNTER — Encounter (HOSPITAL_COMMUNITY)
Admission: EM | Disposition: A | Discharge status: Home / Self Care | Payer: Self-pay | Source: Home / Self Care | Attending: Emergency Medicine

## 2021-07-10 DIAGNOSIS — I70221 Atherosclerosis of native arteries of extremities with rest pain, right leg: Secondary | ICD-10-CM | POA: Diagnosis present

## 2021-07-10 DIAGNOSIS — I779 Disorder of arteries and arterioles, unspecified: Secondary | ICD-10-CM | POA: Diagnosis not present

## 2021-07-10 DIAGNOSIS — I252 Old myocardial infarction: Secondary | ICD-10-CM | POA: Diagnosis not present

## 2021-07-10 DIAGNOSIS — F17210 Nicotine dependence, cigarettes, uncomplicated: Secondary | ICD-10-CM | POA: Diagnosis not present

## 2021-07-10 DIAGNOSIS — I771 Stricture of artery: Secondary | ICD-10-CM

## 2021-07-10 DIAGNOSIS — I2510 Atherosclerotic heart disease of native coronary artery without angina pectoris: Secondary | ICD-10-CM | POA: Diagnosis not present

## 2021-07-10 DIAGNOSIS — Z955 Presence of coronary angioplasty implant and graft: Secondary | ICD-10-CM | POA: Diagnosis not present

## 2021-07-10 DIAGNOSIS — Z7902 Long term (current) use of antithrombotics/antiplatelets: Secondary | ICD-10-CM | POA: Insufficient documentation

## 2021-07-10 DIAGNOSIS — I10 Essential (primary) hypertension: Secondary | ICD-10-CM | POA: Diagnosis not present

## 2021-07-10 DIAGNOSIS — E785 Hyperlipidemia, unspecified: Secondary | ICD-10-CM | POA: Diagnosis not present

## 2021-07-10 DIAGNOSIS — I70239 Atherosclerosis of native arteries of right leg with ulceration of unspecified site: Secondary | ICD-10-CM

## 2021-07-10 DIAGNOSIS — I739 Peripheral vascular disease, unspecified: Secondary | ICD-10-CM

## 2021-07-10 DIAGNOSIS — I998 Other disorder of circulatory system: Secondary | ICD-10-CM

## 2021-07-10 HISTORY — PX: ABDOMINAL AORTOGRAM W/LOWER EXTREMITY: CATH118223

## 2021-07-10 HISTORY — PX: PERIPHERAL VASCULAR INTERVENTION: CATH118257

## 2021-07-10 LAB — CBC WITH DIFFERENTIAL/PLATELET
Abs Immature Granulocytes: 0.03 10*3/uL (ref 0.00–0.07)
Basophils Absolute: 0 10*3/uL (ref 0.0–0.1)
Basophils Relative: 0 %
Eosinophils Absolute: 0.1 10*3/uL (ref 0.0–0.5)
Eosinophils Relative: 1 %
HCT: 39.8 % (ref 36.0–46.0)
Hemoglobin: 13.4 g/dL (ref 12.0–15.0)
Immature Granulocytes: 0 %
Lymphocytes Relative: 39 %
Lymphs Abs: 3.4 10*3/uL (ref 0.7–4.0)
MCH: 33.2 pg (ref 26.0–34.0)
MCHC: 33.7 g/dL (ref 30.0–36.0)
MCV: 98.5 fL (ref 80.0–100.0)
Monocytes Absolute: 0.4 10*3/uL (ref 0.1–1.0)
Monocytes Relative: 5 %
Neutro Abs: 4.7 10*3/uL (ref 1.7–7.7)
Neutrophils Relative %: 55 %
Platelets: 220 10*3/uL (ref 150–400)
RBC: 4.04 MIL/uL (ref 3.87–5.11)
RDW: 14.1 % (ref 11.5–15.5)
WBC: 8.6 10*3/uL (ref 4.0–10.5)
nRBC: 0 % (ref 0.0–0.2)

## 2021-07-10 LAB — BASIC METABOLIC PANEL
Anion gap: 8 (ref 5–15)
BUN: 7 mg/dL (ref 6–20)
CO2: 25 mmol/L (ref 22–32)
Calcium: 9.4 mg/dL (ref 8.9–10.3)
Chloride: 109 mmol/L (ref 98–111)
Creatinine, Ser: 0.69 mg/dL (ref 0.44–1.00)
GFR, Estimated: >60 mL/min (ref >60)
Glucose, Bld: 88 mg/dL (ref 70–99)
Potassium: 3.8 mmol/L (ref 3.5–5.1)
Sodium: 142 mmol/L (ref 135–145)

## 2021-07-10 SURGERY — ABDOMINAL AORTOGRAM W/LOWER EXTREMITY
Anesthesia: LOCAL

## 2021-07-10 MED ORDER — SODIUM CHLORIDE 0.9 % IV SOLN: 250.0000 mL | INTRAVENOUS | Status: DC | PRN

## 2021-07-10 MED ORDER — NICOTINE 21 MG/24HR TD PT24: 21.0000 mg | MEDICATED_PATCH | Freq: Every day | TRANSDERMAL | 3 refills | Status: DC

## 2021-07-10 MED ORDER — CLOPIDOGREL BISULFATE 75 MG PO TABS: 75.0000 mg | ORAL_TABLET | Freq: Every day | ORAL | 11 refills | Status: DC

## 2021-07-10 MED ORDER — LIDOCAINE HCL (PF) 1 % IJ SOLN
INTRAMUSCULAR | Status: AC
  Filled 2021-07-10: qty 30

## 2021-07-10 MED ORDER — ASPIRIN 81 MG PO CHEW
CHEWABLE_TABLET | ORAL | Status: AC
  Filled 2021-07-10: qty 1

## 2021-07-10 MED ORDER — ASPIRIN 81 MG PO CHEW
CHEWABLE_TABLET | ORAL | Status: DC | PRN
  Administered 2021-07-10: 81 mg via ORAL

## 2021-07-10 MED ORDER — ACETAMINOPHEN 500 MG PO TABS
1000.0000 mg | ORAL_TABLET | Freq: Once | ORAL | Status: AC
  Administered 2021-07-10: 1000 mg via ORAL
  Filled 2021-07-10: qty 2

## 2021-07-10 MED ORDER — CLOPIDOGREL BISULFATE 300 MG PO TABS
ORAL_TABLET | ORAL | Status: DC | PRN
  Administered 2021-07-10: 300 mg via ORAL

## 2021-07-10 MED ORDER — SODIUM CHLORIDE 0.9 % IV SOLN
INTRAVENOUS | Status: DC | PRN
  Administered 2021-07-10: 1000 mL

## 2021-07-10 MED ORDER — ACETAMINOPHEN 325 MG PO TABS: 650.0000 mg | ORAL_TABLET | ORAL | Status: DC | PRN

## 2021-07-10 MED ORDER — SODIUM CHLORIDE 0.9% FLUSH: 3.0000 mL | INTRAVENOUS | Status: DC | PRN

## 2021-07-10 MED ORDER — LIDOCAINE HCL (PF) 1 % IJ SOLN
INTRAMUSCULAR | Status: DC | PRN
  Administered 2021-07-10 (×2): 12 mL

## 2021-07-10 MED ORDER — MIDAZOLAM HCL 2 MG/2ML IJ SOLN
INTRAMUSCULAR | Status: DC | PRN
  Administered 2021-07-10 (×2): 1 mg via INTRAVENOUS

## 2021-07-10 MED ORDER — FENTANYL CITRATE (PF) 100 MCG/2ML IJ SOLN
INTRAMUSCULAR | Status: AC
Start: 2021-07-10 — End: ?
  Filled 2021-07-10: qty 2

## 2021-07-10 MED ORDER — SODIUM CHLORIDE 0.9% FLUSH: 3.0000 mL | Freq: Two times a day (BID) | INTRAVENOUS | Status: DC

## 2021-07-10 MED ORDER — MIDAZOLAM HCL 2 MG/2ML IJ SOLN
INTRAMUSCULAR | Status: AC
  Filled 2021-07-10: qty 2

## 2021-07-10 MED ORDER — SODIUM CHLORIDE 0.9 % IV SOLN: INTRAVENOUS | Status: DC

## 2021-07-10 MED ORDER — FENTANYL CITRATE (PF) 100 MCG/2ML IJ SOLN
INTRAMUSCULAR | Status: DC | PRN
  Administered 2021-07-10 (×4): 25 ug via INTRAVENOUS

## 2021-07-10 MED ORDER — LABETALOL HCL 5 MG/ML IV SOLN: 10.0000 mg | INTRAVENOUS | Status: DC | PRN

## 2021-07-10 MED ORDER — CLOPIDOGREL BISULFATE 300 MG PO TABS
ORAL_TABLET | ORAL | Status: AC
  Filled 2021-07-10: qty 1

## 2021-07-10 MED ORDER — BIVALIRUDIN BOLUS VIA INFUSION - CUPID
INTRAVENOUS | Status: DC | PRN
  Administered 2021-07-10: 61.2 mg via INTRAVENOUS

## 2021-07-10 MED ORDER — SODIUM CHLORIDE 0.9 % IV SOLN
INTRAVENOUS | Status: DC | PRN
  Administered 2021-07-10: 1.75 mg/kg/h via INTRAVENOUS

## 2021-07-10 MED ORDER — MORPHINE SULFATE (PF) 4 MG/ML IV SOLN
4.0000 mg | Freq: Once | INTRAVENOUS | Status: AC
  Administered 2021-07-10: 4 mg via INTRAVENOUS
  Filled 2021-07-10: qty 1

## 2021-07-10 MED ORDER — ONDANSETRON HCL 4 MG/2ML IJ SOLN: 4.0000 mg | Freq: Four times a day (QID) | INTRAMUSCULAR | Status: DC | PRN

## 2021-07-10 MED ORDER — HEPARIN (PORCINE) IN NACL 1000-0.9 UT/500ML-% IV SOLN
INTRAVENOUS | Status: AC
Start: 2021-07-10 — End: ?
  Filled 2021-07-10: qty 1000

## 2021-07-10 MED ORDER — IODIXANOL 320 MG/ML IV SOLN
INTRAVENOUS | Status: DC | PRN
  Administered 2021-07-10: 95 mL

## 2021-07-10 MED ORDER — HYDRALAZINE HCL 20 MG/ML IJ SOLN: 5.0000 mg | INTRAMUSCULAR | Status: DC | PRN

## 2021-07-10 SURGICAL SUPPLY — 18 items
CATH ANGIO 5F BER2 65CM (CATHETERS) ×1 IMPLANT
CATH OMNI FLUSH 5F 65CM (CATHETERS) ×1 IMPLANT
DEVICE CLOSURE MYNXGRIP 6/7F (Vascular Products) ×2 IMPLANT
GUIDEWIRE ANGLED .035X150CM (WIRE) IMPLANT
KIT ENCORE 26 ADVANTAGE (KITS) ×2 IMPLANT
KIT MICROPUNCTURE NIT STIFF (SHEATH) ×1 IMPLANT
KIT PV (KITS) ×3 IMPLANT
SHEATH BRITE TIP 7FR 35CM (SHEATH) ×2 IMPLANT
SHEATH PINNACLE 5F 10CM (SHEATH) ×2 IMPLANT
SHEATH PINNACLE 7F 10CM (SHEATH) ×2 IMPLANT
SHEATH PROBE COVER 6X72 (BAG) ×1 IMPLANT
STENT VIABAHN 7X29X80 VBX (Permanent Stent) ×1 IMPLANT
STENT VIABAHN VBX 7X59X80 (Permanent Stent) ×1 IMPLANT
SYR MEDRAD MARK V 150ML (SYRINGE) ×1 IMPLANT
TRANSDUCER W/STOPCOCK (MISCELLANEOUS) ×3 IMPLANT
TRAY PV CATH (CUSTOM PROCEDURE TRAY) ×3 IMPLANT
WIRE BENTSON .035X145CM (WIRE) ×1 IMPLANT
WIRE ROSEN-J .035X180CM (WIRE) ×2 IMPLANT

## 2021-07-10 NOTE — Consult Note (Addendum)
Hospital Consult    Reason for Consult: Critical limb ischemia right leg Requesting Physician: Emergency department MRN #:  [id number]  History of Present Illness: This is a 41 y.o. female who was directed to the emergency room by her podiatrist today.  She has a right ABI of 0.4 with a toe pressure of 44 mmHg.  She has had a lateral right heel wound for the past 7 months.  Over the past several days she has had tissue changes of the great toe and third toe.  She describes hip and buttock claudication of the right leg after walking about 2 minutes over the past 7 months.  She also has rest pain of the right leg overnight.  Past medical history also significant for CAD with MI with coronary stenting about 2 years ago.  She also is managed with hypertension and hyperlipidemia.  She is an everyday tobacco smoker.  She has smoked half a pack a day since she was 41 years old.  She is on dual antiplatelet therapy.  Past Medical History:  Diagnosis Date   Hyperlipidemia    Hypertension    MS (multiple sclerosis) (HCC)    NSTEMI (non-ST elevated myocardial infarction) (HCC)    NSVT (nonsustained ventricular tachycardia) (HCC) 07/02/2019   S/P angioplasty with stent    DES to LAD and Lcx     Past Surgical History:  Procedure Laterality Date   CHOLECYSTECTOMY     CORONARY BALLOON ANGIOPLASTY N/A 07/03/2019   Procedure: CORONARY BALLOON ANGIOPLASTY;  Surgeon: Yvonne Kendall, MD;  [facility];  Service: Cardiovascular;  Laterality: N/A;  distal lad   CORONARY STENT INTERVENTION N/A 07/03/2019   Procedure: CORONARY STENT INTERVENTION;  Surgeon: Yvonne Kendall, MD;  [facility];  Service: Cardiovascular;  Laterality: N/A;  cfx lad    INTRAVASCULAR ULTRASOUND/IVUS N/A 07/03/2019   Procedure: Intravascular Ultrasound/IVUS;  Surgeon: Yvonne Kendall, MD;  [facility];  Service: Cardiovascular;  Laterality: N/A;   LEFT HEART CATH AND CORONARY  ANGIOGRAPHY N/A 07/03/2019   Procedure: LEFT HEART CATH AND CORONARY ANGIOGRAPHY;  Surgeon: Yvonne Kendall, MD;  [facility];  Service: Cardiovascular;  Laterality: N/A;   TUBAL LIGATION      Allergies  Allergen Reactions   Orange Juice [Orange Oil] Hives   Pork-Derived Products Anaphylaxis   Praluent [Alirocumab] Itching   Sulfa Antibiotics Nausea And Vomiting    Prior to Admission medications   Medication Sig Start Date End Date Taking? Authorizing Provider  aspirin EC 81 MG EC tablet Take 1 tablet (81 mg total) by mouth daily. 07/06/19  Yes Arty Baumgartner, NP  atorvastatin (LIPITOR) 80 MG tablet Take 1 tablet (80 mg total) by mouth daily. 08/09/20  Yes Alver Sorrow, NP  cetirizine (ZYRTEC) 10 MG tablet Take 10 mg by mouth daily. 12/25/20 12/25/21 Yes [provider]  clopidogrel (PLAVIX) 75 MG tablet Take 1 tablet (75 mg total) by mouth daily. 11/22/20  Yes Jake Bathe, MD  diclofenac Sodium (VOLTAREN) 1 % GEL Apply 2 g topically 4 (four) times daily. 05/16/21  Yes Marcine Matar, MD  diphenhydrAMINE (BENADRYL) 25 mg capsule Take 25 mg by mouth every 8 (eight) hours as needed for allergies. 12/25/20  Yes [provider]  ezetimibe (ZETIA) 10 MG tablet Take 1 tablet (10 mg total) by mouth daily. 01/02/21  Yes Jake Bathe, MD  fluticasone (FLONASE) 50 MCG/ACT nasal spray Place 2 sprays into both nostrils  daily. Patient taking differently: Place 2 sprays into both nostrils daily as needed. 08/16/19  Yes Georgian Co M, PA-C  nitroGLYCERIN (NITROSTAT) 0.4 MG SL tablet Place 1 tablet (0.4 mg total) under the tongue every 5 (five) minutes as needed for chest pain. 08/09/20  Yes Alver Sorrow, NP  REPATHA SURECLICK 140 MG/ML SOAJ Inject 140 mg into the skin every 14 (fourteen) days. 05/06/21  Yes [provider]  sertraline (ZOLOFT) 50 MG tablet TAKE 1 TABLET(50 MG) BY MOUTH DAILY Patient taking differently: Take 50 mg by mouth at  bedtime. 07/02/21  Yes Marcine Matar, MD  BRILINTA 60 MG TABS tablet Take 60 mg by mouth 2 (two) times daily. 01/30/21   [provider]  HYDROcodone-acetaminophen (NORCO) 5-325 MG tablet Take 1 tablet by mouth 2 (two) times daily as needed. Patient not taking: Reported on 07/10/2021 06/20/21   Cristie Hem, PA-C  nitroGLYCERIN (NITROGLYN) 2 % ointment Apply 0.5 inches topically 3 (three) times daily. Apply around the toe and heel 07/08/21   Felecia Shelling, DPM    Social History   Socioeconomic History   Marital status: Single    Spouse name: Not on file   Number of children: Not on file   Years of education: Not on file   Highest education level: Not on file  Occupational History   Not on file  Tobacco Use   Smoking status: Every Day    Packs/day: 0.50    Types: Cigarettes   Smokeless tobacco: Never  Substance and Sexual Activity   Alcohol use: Never   Drug use: Yes    Types: Marijuana   Sexual activity: Yes  Other Topics Concern   Not on file  Social History Narrative   Not on file   Social Determinants of Health   Financial Resource Strain: Low Risk    Difficulty of Paying Living Expenses: Not hard at all  Food Insecurity: No Food Insecurity   Worried About Programme researcher, broadcasting/film/video in the Last Year: Never true   Ran Out of Food in the Last Year: Never true  Transportation Needs: No Transportation Needs   Lack of Transportation (Medical): No   Lack of Transportation (Non-Medical): No  Physical Activity: Inactive   Days of Exercise per Week: 0 days   Minutes of Exercise per Session: 0 min  Stress: Not on file  Social Connections: Not on file  Intimate Partner Violence: Not on file     Family History  Problem Relation Age of Onset   Sudden Cardiac Death Maternal Grandfather     ROS: Otherwise negative unless mentioned in HPI  Physical Examination  Vitals:   07/10/21 1115 07/10/21 1130  BP: (!) 141/88 (!) 146/79  Pulse: 61 62  Resp: 19 15  Temp:     SpO2: 98% 99%   Body mass index is 32.4 kg/m.  General:  WDWN in NAD Gait: Not observed HENT: WNL, normocephalic Pulmonary: normal non-labored breathing, without Rales, rhonchi,  wheezing Cardiac: regular Abdomen:  soft, NT/ND, no masses Skin: without rashes Vascular Exam/Pulses: Symmetrical radial pulses; palpable left DP pulse; unable to find right femoral pulse; right DP Doppler signal Extremities: Ischemic tissue changes of the right great toe and third toe; ischemic ulceration of the right lateral heel Musculoskeletal: no muscle wasting or atrophy  Neurologic: A&O X 3;  No focal weakness or paresthesias are detected; speech is fluent/normal Psychiatric:  The pt has Normal affect. Lymph:  Unremarkable  CBC    Component Value Date/Time   WBC 8.6 07/10/2021 1133   RBC 4.04 07/10/2021 1133   HGB 13.4 07/10/2021 1133   HGB 14.6 05/16/2021 1456   HCT 39.8 07/10/2021 1133   HCT 43.1 05/16/2021 1456   PLT 220 07/10/2021 1133   PLT 281 05/16/2021 1456   MCV 98.5 07/10/2021 1133   MCV 96 05/16/2021 1456   MCH 33.2 07/10/2021 1133   MCHC 33.7 07/10/2021 1133   RDW 14.1 07/10/2021 1133   RDW 13.0 05/16/2021 1456   LYMPHSABS 3.4 07/10/2021 1133   MONOABS 0.4 07/10/2021 1133   EOSABS 0.1 07/10/2021 1133   BASOSABS 0.0 07/10/2021 1133    BMET    Component Value Date/Time   NA 142 07/10/2021 1027   NA 141 08/09/2020 1557   K 3.8 07/10/2021 1027   CL 109 07/10/2021 1027   CO2 25 07/10/2021 1027   GLUCOSE 88 07/10/2021 1027   BUN 7 07/10/2021 1027   BUN 10 08/09/2020 1557   CREATININE 0.69 07/10/2021 1027   CALCIUM 9.4 07/10/2021 1027   GFRNONAA >60 07/10/2021 1027   GFRAA 119 08/16/2019 1140    COAGS: No results found for: INR, PROTIME   Non-Invasive Vascular Imaging:   Right ABI of 0.4 with toe pressure of 44 mmHg Left ABI and TBI within normal limits    ASSESSMENT/PLAN: This is a 42 y.o. female with ischemic tissue changes and rest pain of her  right lower extremity  -Right heel wound has been present for 7 months.  Ischemic changes to her toes started over the past couple days.  Patient describes hip and buttock claudication of her right lower extremity over the past several months.  This has progressed to rest pain of the right foot.  Motor and sensation are intact right foot.  On exam I am unable to appreciate a right femoral pulse.  Patient likely has iliac occlusive disease.  Plan will be for aortogram with right lower extremity runoff and possible intervention today with Dr. Chestine Spore or Dr. Lenell Antu.  I asked the patient to not eat or drink anything today.  She agrees to proceed.  On-call vascular surgeon Dr. Lenell Antu will evaluate the patient later today and provide further treatment plans   Emilie Rutter PA-C [facility] [phone]   VASCULAR STAFF ADDENDUM: I have independently interviewed and examined the patient. I agree with the above.  RLE CLTI in 40YF with personal and family history of atherosclerotic disease. Active smoker. 2+ L femoral pulse. Absent R femoral pulse 2+ L DP. Absent R DP. Lateral heel ulcer on R. Third toe with ischemic changes. ABI: R 0.4 / L 0.96 TP: R 44 / L 103  Plan RLE angiography today via LCFA access. Counseled she may need open vascular surgery. Anticipate home after angiogram unless complications arise.  Rande Brunt. Lenell Antu, MD [facility] Phone Number: [phone] 07/10/2021 1:09 PM

## 2021-07-10 NOTE — Progress Notes (Addendum)
VASCULAR LAB    ABIs have been performed.  See CV proc for preliminary results.  Called critical results to Amy, RN at [facility] in [place].  Awaiting return call. Amy called back, instructed patient to go to the ED.  I checked her in and spoke with [place], Charity fundraiser, in triage and explained the situation.  Amy states that as soon as Dr. Logan Bores is out of surgery, she will have him touch base with the ED.  Volanda Mangine, RVT 07/10/2021, 9:06 AM

## 2021-07-10 NOTE — Op Note (Signed)
Patient name: Grace Castillo MRN: [id number] DOB: 04-Feb-1981 Sex: female  07/10/2021 Pre-operative Diagnosis: Critical limb ischemia right lower extremity with tissue loss Post-operative diagnosis:  Same Surgeon:  Cephus Shelling, MD Procedure Performed: 1.  Ultrasound-guided access left common femoral artery 2.  Aortogram with catheter selection of aorta 3.  Ultrasound-guided access right common femoral artery 4.  Bilateral common iliac artery angioplasty with stent placement (7 mm x 59 mm VBX right common iliac artery and 7 mm x 29 mm VBX left common iliac artery 5.  Bilateral lower extremity arteriogram with runoff 6.  Mynx closure of bilateral common femoral arteries 7.  69 minutes of monitored moderate conscious sedation time  Indications: 41 year old female who presented to the ED today with evidence of critical limb ischemia of the right lower extremity with an ABI of 0.4 and an absent femoral pulse.  She was seen by her podiatrist today and then sent to the emergency department.  She presents for aortogram, lower extremity arteriogram, and possible intervention after risk benefits discussed.  Findings:   Initially accessed left common femoral artery.  Aortogram showed a subtotal occlusion of the proximal right common iliac artery with >99% stenosis with a reconstituted vessel distally and then a widely patent right external iliac and hypogastric artery.  The left common and external iliac artery was widely patent.  Ultimately after evaluating images the right common femoral was accessed retrograde and we crossed the right common iliac subtotal occlusion retrograde and got kissing wires in the aorta.  We then placed a 7 mm x 59 mm VBX in the right common iliac artery and a 7 mm x 29 mm VBX in the left common iliac artery with kissing iliac artery stents.  Widely patent stents after deployment.  We then shot the runoff distally and widely patent SFA popliteal and three-vessel runoff  down both legs.  Now has a palpable DP pulse in the right foot.   Procedure:  The patient was identified in the holding area and taken to room 8.  The patient was then placed supine on the table and prepped and draped in the usual sterile fashion.  A time out was called.  Patient was given Versed and fentanyl for moderate conscious sedation.  We monitored heart rate, blood pressure, oxygenation and respiratory rate.  I was present for all of sedation.  Ultrasound was used to evaluate the left common femoral artery.  It was patent .  A digital ultrasound image was acquired.  A micropuncture needle was used to access the left common femoral artery under ultrasound guidance.  An 018 wire was advanced without resistance and a micropuncture sheath was placed.  The 018 wire was removed and a benson wire was placed.  The micropuncture sheath was exchanged for a 5 french sheath.  An omniflush catheter was advanced over the wire to the level of L-1.  An abdominal angiogram was obtained.  We then elected to access the right common femoral artery under ultrasound guidance after it was evaluated with ultrasound and was patent.  This was accessed with a micro access needle and placed a microwire and then a microsheath.  I then placed a short 5 [place] sheath.  We then traversed the subtotal right common iliac occlusion retrograde with a BER 2 catheter and a Bentson wire.  We then started the patient on Angiomax after giving bolus given concern for heparin allergy.  We then exchanged for a Rosen wire [place] in the aorta and  put a long 7 [place] Brite tip sheath in both common femoral arteries that were then placed up retrograde into the aorta.  We then marked the right hypogastric artery as well as the aortic bifurcation and the diseased right common iliac lesion.  A 7 mm x 59 mm VBX was deployed in the right common iliac artery with a 7 mm x 29 mm VBX in the left common iliac artery.  Widely patent stents after deployment.  We then  pulled the sheaths down to allow outflow into the hypogastric and got bilateral lower extremity runoff with a Omni Flush catheter in the aorta.  Pertinent findings are noted above.  Satisfied with the results, short 7 [place] sheaths were placed in both common femoral arteries and then Mynx closure devices were deployed bilaterally.  Plan: Patient now has a palpable pulse in her right foot.  Optimized from a vascular standpoint.  Will need aspirin Plavix statin which she is already taking.  We will arrange follow-up in 1 month with non-invasive imaging.   Cephus Shelling, MD [facility] [place] Office: [phone]

## 2021-07-10 NOTE — Discharge Instructions (Signed)
Femoral Site Care This sheet gives you information about how to care for yourself after your procedure. Your health care provider may also give you more specific instructions. If you have problems or questions, contact your health care provider. What can I expect after the procedure?  After the procedure, it is common to have: Bruising that usually fades within 1-2 weeks. Tenderness at the site. Follow these instructions at home: Wound care Follow instructions from your health care provider about how to take care of your insertion site. Make sure you: Wash your hands with soap and water before you change your bandage (dressing). If soap and water are not available, use hand sanitizer. Remove your dressing as told by your health care provider. 24 hours Do not take baths, swim, or use a hot tub until your health care provider approves. You may shower 24-48 hours after the procedure or as told by your health care provider. Gently wash the site with plain soap and water. Pat the area dry with a clean towel. Do not rub the site. This may cause bleeding. Do not apply powder or lotion to the site. Keep the site clean and dry. Check your femoral site every day for signs of infection. Check for: Redness, swelling, or pain. Fluid or blood. Warmth. Pus or a bad smell. Activity For the first 2-3 days after your procedure, or as long as directed: Avoid climbing stairs as much as possible. Do not squat. Do not lift anything that is heavier than 10 lb (4.5 kg), or the limit that you are told, until your health care provider says that it is safe. For 5 days Rest as directed. Avoid sitting for a long time without moving. Get up to take short walks every 1-2 hours. Do not drive for 24 hours if you were given a medicine to help you relax (sedative). General instructions Take over-the-counter and prescription medicines only as told by your health care provider. Keep all follow-up visits as told by your  health care provider. This is important. Contact a health care provider if you have: A fever or chills. You have redness, swelling, or pain around your insertion site. Get help right away if: The catheter insertion area swells very fast. You pass out. You suddenly start to sweat or your skin gets clammy. The catheter insertion area is bleeding, and the bleeding does not stop when you hold steady pressure on the area. The area near or just beyond the catheter insertion site becomes pale, cool, tingly, or numb. These symptoms may represent a serious problem that is an emergency. Do not wait to see if the symptoms will go away. Get medical help right away. Call your local emergency services (911 in the [place] Do not drive yourself to the hospital. Summary After the procedure, it is common to have bruising that usually fades within 1-2 weeks. Check your femoral site every day for signs of infection. Do not lift anything that is heavier than 10 lb (4.5 kg), or the limit that you are told, until your health care provider says that it is safe. This information is not intended to replace advice given to you by your health care provider. Make sure you discuss any questions you have with your health care provider. Document Revised: 02/15/2017 Document Reviewed: 02/15/2017 Elsevier Patient Education  2020 [employer]  

## 2021-07-10 NOTE — ED Provider Notes (Signed)
[facility] Provider Note   CSN: [id number] Arrival date & time: 07/10/21  [id number]     History  No chief complaint on file.   Grace Castillo is a 41 y.o. female.  The history is provided by the patient and medical records. No language interpreter was used.   41 year old female significant history of multiple sclerosis, hypertension, CAD status post angioplasty with stent in 2021 sent here from podiatry office for further evaluation of right foot pain.  Patient reports she noticed discoloration to the heel of her right foot for the past 7 months.  3 days ago she started to notice discoloration to her third toe and 2 days ago she noted discoloration to her right great toe.  She endorsed throbbing achy pain 6 out of 10 at rest and worse with ambulation with tingling sensation.  The symptom has been ongoing but become progressively worse.  She did not recall any specific injury.  No chest pain no trouble breathing no fever chills.  She has been seen by her PCP and through podiatry in the past and during her visit today with Dr. Amalia Hailey, he was concerns of foot ischemia and patient was sent for an ABI.  It was abnormal and patient was recommended to come to ER to be evaluated by vascular surgeon.  Patient is currently on Brilinta and Plavix.  She also admits to smoking approximately half a pack a day.  Home Medications Prior to Admission medications   Medication Sig Start Date End Date Taking? Authorizing Provider  aspirin EC 81 MG EC tablet Take 1 tablet (81 mg total) by mouth daily. 07/06/19   Cheryln Manly, NP  atorvastatin (LIPITOR) 80 MG tablet Take 1 tablet (80 mg total) by mouth daily. 08/09/20   Loel Dubonnet, NP  BRILINTA 60 MG TABS tablet Take 60 mg by mouth 2 (two) times daily. 01/30/21   [provider]  cetirizine (ZYRTEC) 10 MG tablet Take by mouth. 12/25/20 12/25/21  [provider]  clopidogrel (PLAVIX) 75 MG tablet Take 1  tablet (75 mg total) by mouth daily. 11/22/20   Jerline Pain, MD  diclofenac Sodium (VOLTAREN) 1 % GEL Apply 2 g topically 4 (four) times daily. 05/16/21   Ladell Pier, MD  diphenhydrAMINE (BENADRYL) 25 mg capsule Take by mouth. 12/25/20   [provider]  ezetimibe (ZETIA) 10 MG tablet Take 1 tablet (10 mg total) by mouth daily. 01/02/21   Jerline Pain, MD  famotidine (PEPCID) 20 MG tablet Take by mouth. 12/25/20 12/25/21  [provider]  fluticasone (FLONASE) 50 MCG/ACT nasal spray Place 2 sprays into both nostrils daily. 08/16/19   Argentina Donovan, PA-C  HYDROcodone-acetaminophen (NORCO) 5-325 MG tablet Take 1 tablet by mouth 2 (two) times daily as needed. 06/20/21   Aundra Dubin, PA-C  methocarbamol (ROBAXIN) 750 MG tablet Take 1 tablet (750 mg total) by mouth every 8 (eight) hours as needed for muscle spasms. 06/20/21   Aundra Dubin, PA-C  metoprolol tartrate (LOPRESSOR) 25 MG tablet  11/16/20   [provider]  nitroGLYCERIN (NITROGLYN) 2 % ointment Apply 0.5 inches topically 3 (three) times daily. Apply around the toe and heel 07/08/21   Edrick Kins, DPM  nitroGLYCERIN (NITROSTAT) 0.4 MG SL tablet Place 1 tablet (0.4 mg total) under the tongue every 5 (five) minutes as needed for chest pain. 08/09/20   Loel Dubonnet, NP  predniSONE (STERAPRED UNI-PAK 21 TAB) 10 MG (  21) TBPK tablet Take as directed 06/20/21   Aundra Dubin, PA-C  REPATHA SURECLICK [id number] MG/ML SOAJ Inject into the skin. 05/06/21   [provider]  sertraline (ZOLOFT) 50 MG tablet TAKE 1 TABLET(50 MG) BY MOUTH DAILY 07/02/21   Ladell Pier, MD  traMADol (ULTRAM) 50 MG tablet Take 1 tablet (50 mg total) by mouth every 12 (twelve) hours as needed. 05/29/21   Aundra Dubin, PA-C      Allergies    Orange juice Haig Prophet oil], Pork-derived products, Repatha [evolocumab], Praluent [alirocumab], and Sulfa antibiotics    Review of Systems   Review of Systems  All other systems  reviewed and are negative.  Physical Exam Updated Vital Signs BP (!) 181/98 (BP Location: Right Arm)   Pulse 74   Temp 98.7 F (37.1 C) (Oral)   Resp 18   Ht 5' 2.5" (1.588 m)   Wt 81.6 kg   SpO2 100%   BMI 32.40 kg/m  Physical Exam Vitals and nursing note reviewed.  Constitutional:      General: She is not in acute distress.    Appearance: She is well-developed.  HENT:     Head: Atraumatic.  Eyes:     Conjunctiva/sclera: Conjunctivae normal.  Pulmonary:     Effort: Pulmonary effort is normal.  Musculoskeletal:     Cervical back: Neck supple.  Skin:    Findings: No rash.     Comments: Right foot: Lateral aspect of the heel appears dusky and cool to the touch measuring approximately 4 cm in diameter and tender to palpation.  Third toe is also cool and dusky with delayed cap refill.  The tip of the great toe is dusky in appearance with delayed cap refill.  Unable to palpate pedal pulse.  Leg compartment is soft and nontender.  Neurological:     Mental Status: She is alert.  Psychiatric:        Mood and Affect: Mood normal.    ED Results / Procedures / Treatments   Labs (all labs ordered are listed, but only abnormal results are displayed) Labs Reviewed  BASIC METABOLIC PANEL  CBC WITH DIFFERENTIAL/PLATELET  CBC WITH DIFFERENTIAL/PLATELET    EKG None  Radiology VAS [place] ABI WITH/WO TBI  Result Date: 07/10/2021  LOWER EXTREMITY DOPPLER STUDY Patient Name:  Grace Castillo  Date of Exam:   07/10/2021 Medical Rec #: [id number]     Accession #:    [id number] Date of Birth: [birth year]     Patient Gender: F Patient Age:   77 years Exam Location:  [facility] Procedure:      VAS [place] ABI WITH/WO TBI Referring Phys: Ruby Cola EVANS --------------------------------------------------------------------------------  Indications: Claudication, and rest pain. Patient with right dusky heel, great              toe, 3rd toe, and top of foot. First visit 05/30/21 to podiatry.               Patient has also being followed for chronic right hip pain. High Risk Factors: Hypertension, hyperlipidemia, coronary artery disease. Other Factors: NSTEMI, status post stent. Multiple sclerosis.  Comparison Study: No prior study on file Performing Technologist: Sharion Dove RVS  Examination Guidelines: A complete evaluation includes at minimum, Doppler waveform signals and systolic blood pressure reading at the level of bilateral brachial, anterior tibial, and posterior tibial arteries, when vessel segments are accessible. Bilateral testing is considered an integral part of a complete examination. Photoelectric Plethysmograph (PPG) waveforms and  toe systolic pressure readings are included as required and additional duplex testing as needed. Limited examinations for reoccurring indications may be performed as noted.  ABI Findings: +---------+------------------+-----+-----------+--------+ Right    Rt Pressure (mmHg)IndexWaveform   Comment  +---------+------------------+-----+-----------+--------+ Brachial 150                    multiphasic         +---------+------------------+-----+-----------+--------+ PTA      68                0.43 monophasic          +---------+------------------+-----+-----------+--------+ DP       55                0.35 monophasic          +---------+------------------+-----+-----------+--------+ Great Toe44                0.28                     +---------+------------------+-----+-----------+--------+ +---------+------------------+-----+-----------+-------+ Left     Lt Pressure (mmHg)IndexWaveform   Comment +---------+------------------+-----+-----------+-------+ Brachial 157                    multiphasic        +---------+------------------+-----+-----------+-------+ PTA      150               0.96 multiphasic        +---------+------------------+-----+-----------+-------+ DP       133               0.85 multiphasic         +---------+------------------+-----+-----------+-------+ Great Toe103               0.66                    +---------+------------------+-----+-----------+-------+ +-------+-----------+-----------+------------+------------+ ABI/TBIToday's ABIToday's TBIPrevious ABIPrevious TBI +-------+-----------+-----------+------------+------------+ Right  0.43       0.28                                +-------+-----------+-----------+------------+------------+ Left   0.96       0.66                                +-------+-----------+-----------+------------+------------+   Summary: Right: Resting right ankle-brachial index indicates severe to critical right lower extremity arterial disease. The right toe-brachial index is abnormal. Left: Resting left ankle-brachial index is within normal range. No evidence of significant left lower extremity arterial disease. The left toe-brachial index is abnormal. *See table(s) above for measurements and observations.     Preliminary     Procedures .Critical Care Performed by: Domenic Moras, PA-C Authorized by: Domenic Moras, PA-C   Critical care provider statement:    Critical care time (minutes):  30   Critical care was time spent personally by me on the following activities:  Development of treatment plan with patient or surrogate, discussions with consultants, evaluation of patient's response to treatment, examination of patient, ordering and review of laboratory studies, ordering and review of radiographic studies, ordering and performing treatments and interventions, pulse oximetry, re-evaluation of patient's condition and review of old charts    Medications Ordered in ED Medications  0.9 %  sodium chloride infusion (has no administration in time range)  acetaminophen (TYLENOL) tablet 1,000 mg (1,000 mg Oral  Given 07/10/21 1030)  morphine (PF) 4 MG/ML injection 4 mg (4 mg Intravenous Given 07/10/21 1139)    ED Course/ Medical Decision Making/ A&P                            Medical Decision Making Amount and/or Complexity of Data Reviewed Labs: ordered.  Risk OTC drugs. Prescription drug management. Decision regarding hospitalization.   BP (!) 181/98 (BP Location: Right Arm)   Pulse 74   Temp 98.7 F (37.1 C) (Oral)   Resp 18   Ht 5' 2.5" (1.588 m)   Wt 81.6 kg   SpO2 100%   BMI 32.40 kg/m   10:19 AM This is a 41 year old female with history of CAD status post cardiac stenting currently on Brilinta and Plavix, history of tobacco abuse, presenting with ongoing claudication involving her right lower extremity.  She noticed discolorations of the heel on her right foot ongoing for the past 7 months however for the past 3 days she also noticed discoloration of her third toe on the right foot and now discoloration to the tip of her great toe.  He was seen by podiatry today, had an ABI performed that shows finding concerning for critical right lower extremity arterial disease.  Plan to consult vascular surgeon for further management.  10:52 AM Appreciate prompt consultation from on-call vascular surgeon Dr. Luan Pulling who will see patient in the ER.  I have initially ordered a CT angiogram of the right lower extremity however he recommend to hold off on the CT in the meantime until patient can be evaluated.  We will cancel CT.  This patient presents to the ED for concern of R foot pain, this involves an extensive number of treatment options, and is a complaint that carries with it a high risk of complications and morbidity.  The differential diagnosis includes foot ischemia, claudication, pressure ulcer, cellulitis, DVT, fx/dislocation  Co morbidities that complicate the patient evaluation CAD  Tobacco abuse Additional history obtained:  Additional history obtained from patient External records from outside source obtained and reviewed including notes from podiatry from today when she was seen earlier  Lab Tests:  I Ordered, and  personally interpreted labs.  The pertinent results include:  as above  Imaging Studies ordered:  I ordered imaging studies including ABI of RLE I independently visualized and interpreted imaging which showed critical RLE arterial disease I agree with the radiologist interpretation  Cardiac Monitoring:  The patient was maintained on a cardiac monitor.  I personally viewed and interpreted the cardiac monitored which showed an underlying rhythm of: NSR  Medicines ordered and prescription drug management:  I ordered medication including tylenol  for headache Reevaluation of the patient after these medicines showed that the patient improved I have reviewed the patients home medicines and have made adjustments as needed  Test Considered: foot CT, but felt ABI is sufficient as this is a vascular complaint  Venous doppler study, but presentation suggestive of arterial source and not venous  Critical Interventions: as above  Consultations Obtained:  I requested consultation with the vascular surgeon Dr. Luan Pulling,  and discussed lab and imaging findings as well as pertinent plan - they recommend: aortogram and possible intervention today.  Problem List / ED Course: ischemic right foot  Reevaluation:  After the interventions noted above, I reevaluated the patient and found that they have :stayed the same  Social Determinants of Health: tobacco use  Dispostion:  After  consideration of the diagnostic results and the patients response to treatment, I feel that the patent would benefit from admission.         Final Clinical Impression(s) / ED Diagnoses Final diagnoses:  Acute lower limb ischemia    Rx / DC Orders ED Discharge Orders     None         Domenic Moras, PA-C 07/10/21 1321    Blanchie Dessert, MD 07/10/21 1512

## 2021-07-10 NOTE — ED Triage Notes (Signed)
Pt was sent over from vascular for a consult for an ischemic right foot. Pt does have a pulse that can be located with a doppler but has severely diminished blood flow. Pt endorses pain around the heel.

## 2021-07-10 NOTE — Telephone Encounter (Signed)
Called and spoke with [name] w/ vascular, instructed patient per our physician for her to go straight to ER. Vascular wanted to know if we can have a doctor call hospital so that the patient does not have to wait so long.

## 2021-07-10 NOTE — Telephone Encounter (Signed)
Nadia with Cardiovascular([phone]) is calling with a severe to critical right foot Vascular results for patient. They are waiting until  further  instructions from physician before releasing the patient. Please advise.

## 2021-07-11 ENCOUNTER — Ambulatory Visit: Payer: Medicaid Other | Admitting: Podiatry

## 2021-07-11 ENCOUNTER — Encounter (HOSPITAL_COMMUNITY): Payer: Self-pay | Admitting: Vascular Surgery

## 2021-07-11 MED FILL — Heparin Sod (Porcine)-NaCl IV Soln 1000 Unit/500ML-0.9%: INTRAVENOUS | Qty: 1000 | Status: AC

## 2021-07-11 NOTE — Telephone Encounter (Signed)
It looks like the patient had an angiogram performed yesterday with Dr. Chestine Spore.  Looks like it was good success. Ammie, can you make sure patient has a follow-up appointment outpatient next week.  Thanks, Dr. Logan Bores

## 2021-07-15 ENCOUNTER — Encounter (HOSPITAL_COMMUNITY): Payer: Self-pay | Admitting: Vascular Surgery

## 2021-07-16 ENCOUNTER — Encounter (HOSPITAL_COMMUNITY): Payer: Self-pay | Admitting: Vascular Surgery

## 2021-07-17 NOTE — Telephone Encounter (Signed)
Is this f/u appointment with Dr Carlis Abbott?

## 2021-07-24 NOTE — Telephone Encounter (Signed)
Patient has a f/u appointment for angiogram

## 2021-07-28 ENCOUNTER — Encounter: Payer: Self-pay | Admitting: Physical Medicine and Rehabilitation

## 2021-07-28 ENCOUNTER — Ambulatory Visit: Payer: Self-pay

## 2021-07-28 ENCOUNTER — Ambulatory Visit (INDEPENDENT_AMBULATORY_CARE_PROVIDER_SITE_OTHER): Payer: Medicaid Other | Admitting: Physical Medicine and Rehabilitation

## 2021-07-28 VITALS — BP 120/82 | HR 74

## 2021-07-28 DIAGNOSIS — M5416 Radiculopathy, lumbar region: Secondary | ICD-10-CM

## 2021-07-28 MED ORDER — METHYLPREDNISOLONE ACETATE 80 MG/ML IJ SUSP
80.0000 mg | Freq: Once | INTRAMUSCULAR | Status: AC
  Administered 2021-07-28: 80 mg

## 2021-07-28 NOTE — Patient Instructions (Signed)

## 2021-07-28 NOTE — Progress Notes (Signed)
Pt state lower back pain that travels down her right left leg. Pt state walking and standing makes the pain worse. Pt state she takes pain meds to help ease her pain. Pt has hx of procedure in her leg.  Numeric Pain Rating Scale and Functional Assessment Average Pain 9   In the last MONTH (on 0-10 scale) has pain interfered with the following?  1. General activity like being  able to carry out your everyday physical activities such as walking, climbing stairs, carrying groceries, or moving a chair?  Rating(10)   +Driver, +BT, -Dye Allergies.

## 2021-07-28 NOTE — Progress Notes (Unsigned)
Grace Castillo - 41 y.o. female MRN [id number]  Date of birth: Oct 03, 1980  Office Visit Note: Visit Date: 07/28/2021 PCP: Marcine Matar, MD Referred by: Tarry Kos, MD  Subjective: Chief Complaint  Patient presents with   Lower Back - Pain   Right Leg - Pain   HPI:  Grace Castillo is a 41 y.o. female who comes in todayHPI ROS Otherwise per HPI.  Assessment & Plan: Visit Diagnoses:    ICD-10-CM   1. Lumbar radiculopathy  M54.16 XR C-ARM NO REPORT    Epidural Steroid injection    methylPREDNISolone acetate (DEPO-MEDROL) injection 80 mg      Plan: No additional findings.   Meds & Orders:  Meds ordered this encounter  Medications   methylPREDNISolone acetate (DEPO-MEDROL) injection 80 mg    Orders Placed This Encounter  Procedures   XR C-ARM NO REPORT   Epidural Steroid injection    Follow-up: No follow-ups on file.   Procedures: No procedures performed      Clinical History: MRI LUMBAR SPINE WITHOUT CONTRAST   TECHNIQUE: Multiplanar, multisequence MR imaging of the lumbar spine was performed. No intravenous contrast was administered.   COMPARISON:  None.   FINDINGS: Segmentation:  Standard.   Alignment:  Physiologic.   Vertebrae:  No fracture, evidence of discitis, or bone lesion.   Conus medullaris and cauda equina: Conus extends to the L2 level. Conus and cauda equina appear normal.   Paraspinal and other soft tissues: Negative.   Disc levels:   At L5-S1, there is a small central disc protrusion in close proximity to the left S1 nerve root in the lateral recess. There is no central spinal canal or neural foraminal stenosis.   IMPRESSION: 1. Small central disc protrusion at L5-S1 in close proximity to the left S1 nerve root in the lateral recess. Correlate for left S1 radiculopathy. 2. Otherwise normal lumbar spine.     Electronically Signed By: Deatra Robinson M.D. On: 06/07/2021 23:48 ---- MRI PELVIS WITHOUT CONTRAST    TECHNIQUE: Multiplanar multisequence MR imaging of the pelvis was performed. No intravenous contrast was administered.   COMPARISON:  None.   FINDINGS: Bones:   No hip fracture, dislocation or avascular necrosis.   No periosteal reaction or bone destruction. No aggressive osseous lesion.   Normal sacrum and sacroiliac joints. No SI joint widening or erosive changes.   Articular cartilage and labrum   Articular cartilage: Articular cartilage thinning without evidence of full-thickness defect.   Labrum: Grossly intact, but evaluation is limited by lack of intraarticular fluid.   Joint or bursal effusion   Joint effusion:  No hip joint effusion.  No SI joint effusion.   Bursae:  No bursa formation.   Muscles and tendons   Flexors: Normal.   Extensors: Normal.   Abductors: Normal.   Adductors: Normal.   Gluteals: Normal.   Hamstrings: Normal.   Other findings   No pelvic free fluid. No fluid collection or hematoma. No inguinal lymphadenopathy. No inguinal hernia. There is a fibroid uterus.   IMPRESSION: 1.  No evidence of sacroiliitis.   2.  No significant hip arthropathy or joint effusion.   3.  No evidence of tendinopathy or muscle strain.   4.  Fibroid uterus.     Electronically Signed   By: Larose Hires D.O.   On: 06/07/2021 21:54     Objective:  VS:  HT:    WT:   BMI:     BP:120/82  HR:74bpm  TEMP: ( )  RESP:  Physical Exam   Imaging: No results found.

## 2021-07-30 ENCOUNTER — Other Ambulatory Visit: Payer: Self-pay | Admitting: Family

## 2021-07-30 ENCOUNTER — Other Ambulatory Visit: Payer: Self-pay | Admitting: Internal Medicine

## 2021-07-30 DIAGNOSIS — F32A Depression, unspecified: Secondary | ICD-10-CM

## 2021-07-30 DIAGNOSIS — I25118 Atherosclerotic heart disease of native coronary artery with other forms of angina pectoris: Secondary | ICD-10-CM

## 2021-07-30 NOTE — Telephone Encounter (Signed)
Pt of Dr. Skains. Please review for refill. Thank you! 

## 2021-08-08 ENCOUNTER — Other Ambulatory Visit: Payer: Self-pay | Admitting: Internal Medicine

## 2021-08-08 NOTE — Procedures (Signed)
S1 Lumbosacral Transforaminal Epidural Steroid Injection - Sub-Pedicular Approach with Fluoroscopic Guidance   Patient: Grace Castillo      Date of Birth: [birth year] MRN: [id number] PCP: Marcine Matar, MD      Visit Date: 07/28/2021   Universal Protocol:    Date/Time: 06/23/234:44 PM  Consent Given By: the patient  Position:  PRONE  Additional Comments: Vital signs were monitored before and after the procedure. Patient was prepped and draped in the usual sterile fashion. The correct patient, procedure, and site was verified.   Injection Procedure Details:  Procedure Site One Meds Administered:  Meds ordered this encounter  Medications   methylPREDNISolone acetate (DEPO-MEDROL) injection 80 mg    Laterality: Right  Location/Site:  S1 Foramen   Needle size: 22 ga.  Needle type: Spinal  Needle Placement: Transforaminal  Findings:   -Comments: Excellent flow of contrast along the nerve, nerve root and into the epidural space.  Epidurogram: Contrast epidurogram showed no nerve root cut off or restricted flow pattern.  Procedure Details: After squaring off the sacral end-plate to get a true AP view, the C-arm was positioned so that the best possible view of the S1 foramen was visualized. The soft tissues overlying this structure were infiltrated with 2-3 ml. of 1% Lidocaine without Epinephrine.    The spinal needle was inserted toward the target using a "trajectory" view along the fluoroscope beam.  Under AP and lateral visualization, the needle was advanced so it did not puncture dura. Biplanar projections were used to confirm position. Aspiration was confirmed to be negative for CSF and/or blood. A 1-2 ml. volume of Isovue-250 was injected and flow of contrast was noted at each level. Radiographs were obtained for documentation purposes.   After attaining the desired flow of contrast documented above, a 0.5 to 1.0 ml test dose of 0.25% Marcaine was injected into each  respective transforaminal space.  The patient was observed for 90 seconds post injection.  After no sensory deficits were reported, and normal lower extremity motor function was noted,   the above injectate was administered so that equal amounts of the injectate were placed at each foramen (level) into the transforaminal epidural space.   Additional Comments:  The patient tolerated the procedure well Dressing: Band-Aid with 2 x 2 sterile gauze    Post-procedure details: Patient was observed during the procedure. Post-procedure instructions were reviewed.  Patient left the clinic in stable condition.

## 2021-08-09 ENCOUNTER — Other Ambulatory Visit: Payer: Self-pay

## 2021-08-09 DIAGNOSIS — I739 Peripheral vascular disease, unspecified: Secondary | ICD-10-CM

## 2021-08-29 NOTE — Progress Notes (Unsigned)
HISTORY AND PHYSICAL     CC:  follow up. Requesting Provider:  Marcine Matar, MD  HPI: This is a 41 y.o. female who is here today for follow up for PAD.  She was seen in the ED for a right lateral heel wound that was present for 7 months and she had an ABI of 0.4 with toe pressure of .  She was having hip and buttock claudication of the right leg after walking for 2 minutes.  She has hx significant for CAD with hx of MI with coronary stenting ~ 2 years prior.    She was taken for angiogram on 07/10/2021 and had bilateral CIA angioplasty with stent placement she had a palpable right DP pulse at the conclusion of the procedure.  She was started on asa and plavix.    The pt returns today for follow up.  She states that her heel wound has healed.  She is able to walk without issues.  She does not have wounds or rest pain.  She states she does have some spasms in her legs during the day and at night.  The pt is on a statin for cholesterol management.    The pt is on an aspirin.    Other AC:  Plavix The pt is not on medication for hypertension.  The pt does not have diabetes. Tobacco hx:  current  Pt does not have family hx of AAA.  Father's hx unknown.   Past Medical History:  Diagnosis Date   Hyperlipidemia    Hypertension    MS (multiple sclerosis) (HCC)    NSTEMI (non-ST elevated myocardial infarction) (HCC)    NSVT (nonsustained ventricular tachycardia) (HCC) 07/02/2019   S/P angioplasty with stent    DES to LAD and Lcx     Past Surgical History:  Procedure Laterality Date   ABDOMINAL AORTOGRAM W/LOWER EXTREMITY N/A 07/10/2021   Procedure: ABDOMINAL AORTOGRAM W/LOWER EXTREMITY;  Surgeon: Cephus Shelling, MD;  [facility];  Service: Cardiovascular;  Laterality: N/A;   CHOLECYSTECTOMY     CORONARY BALLOON ANGIOPLASTY N/A 07/03/2019   Procedure: CORONARY BALLOON ANGIOPLASTY;  Surgeon: Yvonne Kendall, MD;  Location: [facility];  Service:  Cardiovascular;  Laterality: N/A;  distal lad   CORONARY STENT INTERVENTION N/A 07/03/2019   Procedure: CORONARY STENT INTERVENTION;  Surgeon: Yvonne Kendall, MD;  Location: [facility];  Service: Cardiovascular;  Laterality: N/A;  cfx lad    INTRAVASCULAR ULTRASOUND/IVUS N/A 07/03/2019   Procedure: Intravascular Ultrasound/IVUS;  Surgeon: Yvonne Kendall, MD;  [facility];  Service: Cardiovascular;  Laterality: N/A;   LEFT HEART CATH AND CORONARY ANGIOGRAPHY N/A 07/03/2019   Procedure: LEFT HEART CATH AND CORONARY ANGIOGRAPHY;  Surgeon: Yvonne Kendall, MD;  [facility];  Service: Cardiovascular;  Laterality: N/A;   PERIPHERAL VASCULAR INTERVENTION  07/10/2021   Procedure: PERIPHERAL VASCULAR INTERVENTION;  Surgeon: Cephus Shelling, MD;  Location: [facility];  Service: Cardiovascular;;  Bilateral Common Iliac Stents   TUBAL LIGATION      Allergies  Allergen Reactions   Orange Juice [Orange Oil] Hives   Pork-Derived Products Anaphylaxis   Praluent [Alirocumab] Itching   Sulfa Antibiotics Nausea And Vomiting    Current Outpatient Medications  Medication Sig Dispense Refill   aspirin EC 81 MG EC tablet Take 1 tablet (81 mg total) by mouth daily. 90 tablet 0   atorvastatin (LIPITOR) 80 MG tablet TAKE 1 TABLET(80 MG) BY MOUTH  DAILY 90 tablet 1   cetirizine (ZYRTEC) 10 MG tablet Take 10 mg by mouth daily.     clopidogrel (PLAVIX) 75 MG tablet Take 1 tablet (75 mg total) by mouth daily. 30 tablet 11   clopidogrel (PLAVIX) 75 MG tablet Take 1 tablet (75 mg total) by mouth daily. 30 tablet 11   diclofenac Sodium (VOLTAREN) 1 % GEL Apply 2 g topically 4 (four) times daily. 100 g 2   diphenhydrAMINE (BENADRYL) 25 mg capsule Take 25 mg by mouth every 8 (eight) hours as needed for allergies.     ezetimibe (ZETIA) 10 MG tablet Take 1 tablet (10 mg total) by mouth daily. 90 tablet 3   fluticasone (FLONASE) 50 MCG/ACT nasal spray Place 2 sprays  into both nostrils daily. (Patient taking differently: Place 2 sprays into both nostrils daily as needed.) 16 g 6   HYDROcodone-acetaminophen (NORCO) 5-325 MG tablet Take 1 tablet by mouth 2 (two) times daily as needed. 20 tablet 0   nicotine (NICODERM CQ - DOSED IN MG/24 HOURS) 21 mg/24hr patch Place 1 patch (21 mg total) onto the skin daily. 30 patch 3   nitroGLYCERIN (NITROGLYN) 2 % ointment Apply 0.5 inches topically 3 (three) times daily. Apply around the toe and heel 30 g 1   nitroGLYCERIN (NITROSTAT) 0.4 MG SL tablet Place 1 tablet (0.4 mg total) under the tongue every 5 (five) minutes as needed for chest pain. 25 tablet 6   REPATHA SURECLICK 140 MG/ML SOAJ Inject 140 mg into the skin every 14 (fourteen) days.     sertraline (ZOLOFT) 50 MG tablet TAKE 1 TABLET(50 MG) BY MOUTH DAILY (Patient taking differently: Take 50 mg by mouth at bedtime.) 30 tablet 0   No current facility-administered medications for this visit.    Family History  Problem Relation Age of Onset   Sudden Cardiac Death Maternal Grandfather     Social History   Socioeconomic History   Marital status: Single    Spouse name: Not on file   Number of children: Not on file   Years of education: Not on file   Highest education level: Not on file  Occupational History   Not on file  Tobacco Use   Smoking status: Every Day    Packs/day: 0.50    Types: Cigarettes   Smokeless tobacco: Never  Substance and Sexual Activity   Alcohol use: Never   Drug use: Yes    Types: Marijuana   Sexual activity: Yes  Other Topics Concern   Not on file  Social History Narrative   Not on file   Social Determinants of Health   Financial Resource Strain: Low Risk  (12/06/2020)   Overall Financial Resource Strain (CARDIA)    Difficulty of Paying Living Expenses: Not hard at all  Food Insecurity: No Food Insecurity (12/06/2020)   Hunger Vital Sign    Worried About Running Out of Food in the Last Year: Never true    Ran Out of  Food in the Last Year: Never true  Transportation Needs: No Transportation Needs (12/06/2020)   PRAPARE - Administrator, Civil Service (Medical): No    Lack of Transportation (Non-Medical): No  Physical Activity: Inactive (12/06/2020)   Exercise Vital Sign    Days of Exercise per Week: 0 days    Minutes of Exercise per Session: 0 min  Stress: Not on file  Social Connections: Not on file  Intimate Partner Violence: Not on file     REVIEW OF  SYSTEMS:   [X]  denotes positive finding, [ ]  denotes negative finding Cardiac  Comments:  Chest pain or chest pressure:    Shortness of breath upon exertion:    Short of breath when lying flat:    Irregular heart rhythm:        Vascular    Pain in calf, thigh, or hip brought on by ambulation:    Pain in feet at night that wakes you up from your sleep:     Blood clot in your veins:    Leg swelling:         Pulmonary    Oxygen at home:    Productive cough:     Wheezing:         Neurologic    Sudden weakness in arms or legs:     Sudden numbness in arms or legs:     Sudden onset of difficulty speaking or slurred speech:    Temporary loss of vision in one eye:     Problems with dizziness:         Gastrointestinal    Blood in stool:     Vomited blood:         Genitourinary    Burning when urinating:     Blood in urine:        Psychiatric    Major depression:         Hematologic    Bleeding problems:    Problems with blood clotting too easily:        Skin    Rashes or ulcers:        Constitutional    Fever or chills:      PHYSICAL EXAMINATION:  Today's Vitals   09/02/21 1006  BP: 128/84  Pulse: 72  Resp: 20  Temp: 97.6 F (36.4 C)  TempSrc: Oral  SpO2: 98%  Weight: 183 lb 9.6 oz (83.3 kg)  Height: 5' 2.5" (1.588 m)   Body mass index is 33.05 kg/m.   General:  WDWN in NAD; vital signs documented above Gait: Not observed HENT: WNL, normocephalic Pulmonary: normal non-labored breathing , without  wheezing Cardiac: regular HR, without carotid bruits Abdomen: soft, NT, no masses; aortic pulse is not palpable Skin: without rashes Vascular Exam/Pulses:  Right Left  Radial 2+ (normal) 2+ (normal)  Femoral 2+ (normal) 2+ (normal)  Popliteal Unable to palpate Unable to palpate  DP 2+ (normal) multiphasic  PT Unable to palpate Brisk doppler flow  Peroneal Not examined Brisk doppler flow   Extremities: without ischemic changes, without Gangrene , without cellulitis; without open wounds Musculoskeletal: no muscle wasting or atrophy  Neurologic: A&O X 3 Psychiatric:  The pt has Normal affect.   Non-Invasive Vascular Imaging:   ABI's/TBI's on 09/02/2021: Right:  1.05/0.77 - Great toe pressure: 120 Left:  1.00/0.74 - Great toe pressure: 114  Arterial duplex on 09/02/2021: Abdominal Aorta Findings:  +-------------+-------+----------+----------+---------+--------+--------+  Location     AP (cm)Trans (cm)PSV (cm/s)Waveform ThrombusComments  +-------------+-------+----------+----------+---------+--------+--------+  Distal                        128       triphasic                  +-------------+-------+----------+----------+---------+--------+--------+  RT EIA Distal                 128       triphasic                  +-------------+-------+----------+----------+---------+--------+--------+  LT EIA Distal                 124       triphasic                  +-------------+-------+----------+----------+---------+--------+--------+   Right CFA 81 cm/s triphasic waveform, Left CFA 90 cm/s triphasic waveform.   Right Stent(s):  +---------------+----++---------++  Prox to Stent  107 triphasic  +---------------+----++---------++  Proximal Stent 135 triphasic  +---------------+----++---------++  Mid Stent      123 biphasic   +---------------+----++---------++  Distal Stent   1134triphasic  +---------------+----++---------++   Distal to Stent122 biphasic   +---------------+----++---------++   Left Stent(s):  +---------------+---++---------++  Prox to Stent  120triphasic  +---------------+---++---------++  Proximal Stent 150triphasic  +---------------+---++---------++  Mid Stent      123triphasic  +---------------+---++---------++  Distal Stent   112triphasic  +---------------+---++---------++  Distal to Stent149triphasic  +---------------+---++---------++   Summary:  Patent bilateral common iliac artery stents with no visualized stenosis.   Previous ABI's/TBI's on 07/10/2021: Right:  0.43/0.28 - Great toe pressure: 44 Left:  0.96/0.66 - Great toe pressure:  103    ASSESSMENT/PLAN:: 41 y.o. female here for follow up for PAD with hx of bilateral CIA stenting  PAD -pt doing well with palpable right DP pulse and multiphasic flow left foot.  Her right heel wound has healed.  She is not having claudication, rest pain or non healing wounds.   Current smoker -discussed the importance of smoking cessation especially given her young age and hx of MI with coronary stenting.  Discussed that if she continues to smoke, she is at risk of stroke, heart attack and limb loss  -continue asa/statin/plavix -pt will f/u in 6 months with bilateral aortoiliac duplex and ABI.  She knows to call sooner if she develops any non healing wounds or rest pain.   Doreatha Massed, [facility] [phone]  Clinic MD:   Chestine Spore

## 2021-09-02 ENCOUNTER — Ambulatory Visit (HOSPITAL_COMMUNITY)
Admission: RE | Admit: 2021-09-02 | Discharge: 2021-09-02 | Disposition: A | Discharge status: Home / Self Care | Payer: Medicaid Other | Source: Ambulatory Visit | Attending: Vascular Surgery | Admitting: Vascular Surgery

## 2021-09-02 ENCOUNTER — Ambulatory Visit (INDEPENDENT_AMBULATORY_CARE_PROVIDER_SITE_OTHER)
Admit: 2021-09-02 | Discharge: 2021-09-02 | Disposition: A | Discharge status: Home / Self Care | Payer: Medicaid Other | Attending: Vascular Surgery | Admitting: Vascular Surgery

## 2021-09-02 ENCOUNTER — Ambulatory Visit (INDEPENDENT_AMBULATORY_CARE_PROVIDER_SITE_OTHER): Payer: Medicaid Other | Admitting: Physician Assistant

## 2021-09-02 VITALS — BP 128/84 | HR 72 | Temp 97.6°F | Resp 20 | Ht 62.5 in | Wt 183.6 lb

## 2021-09-02 DIAGNOSIS — I739 Peripheral vascular disease, unspecified: Secondary | ICD-10-CM

## 2021-09-10 ENCOUNTER — Other Ambulatory Visit: Payer: Self-pay

## 2021-09-10 DIAGNOSIS — I2510 Atherosclerotic heart disease of native coronary artery without angina pectoris: Secondary | ICD-10-CM

## 2021-09-10 DIAGNOSIS — I739 Peripheral vascular disease, unspecified: Secondary | ICD-10-CM

## 2021-10-03 ENCOUNTER — Ambulatory Visit (INDEPENDENT_AMBULATORY_CARE_PROVIDER_SITE_OTHER): Payer: Medicaid Other | Admitting: Internal Medicine

## 2021-10-03 ENCOUNTER — Encounter (HOSPITAL_BASED_OUTPATIENT_CLINIC_OR_DEPARTMENT_OTHER): Payer: Self-pay | Admitting: Internal Medicine

## 2021-10-03 VITALS — BP 116/76 | HR 88 | Ht 62.0 in | Wt 183.1 lb

## 2021-10-03 DIAGNOSIS — I25118 Atherosclerotic heart disease of native coronary artery with other forms of angina pectoris: Secondary | ICD-10-CM | POA: Diagnosis not present

## 2021-10-03 DIAGNOSIS — E785 Hyperlipidemia, unspecified: Secondary | ICD-10-CM | POA: Diagnosis not present

## 2021-10-03 DIAGNOSIS — I739 Peripheral vascular disease, unspecified: Secondary | ICD-10-CM | POA: Diagnosis not present

## 2021-10-03 DIAGNOSIS — Z955 Presence of coronary angioplasty implant and graft: Secondary | ICD-10-CM | POA: Diagnosis not present

## 2021-10-03 LAB — LIPOPROTEIN A (LPA): Lipoprotein (a): 25 nmol/L (ref <75.0)

## 2021-10-03 LAB — NMR, LIPOPROFILE

## 2021-10-03 MED ORDER — REPATHA SURECLICK 140 MG/ML ~~LOC~~ SOAJ: 140.0000 mg | SUBCUTANEOUS | 3 refills | Status: DC

## 2021-10-03 NOTE — Patient Instructions (Signed)
Medication Instructions:  NO CHANGES today   *If you need a refill on your cardiac medications before your next appointment, please call your pharmacy*   Lab Work: NMR Lipoprofile today   FASTING lab work to check cholesterol in 1 year   If you have labs (blood work) drawn today and your tests are completely normal, you will receive your results only by: MyChart Message (if you have MyChart) OR A paper copy in the mail If you have any lab test that is abnormal or we need to change your treatment, we will call you to review the results.   Testing/Procedures: NONE   Follow-Up: At [facility], you and your health needs are our priority.  As part of our continuing mission to provide you with exceptional heart care, we have created designated Provider Care Teams.  These Care Teams include your primary Cardiologist (physician) and Advanced Practice Providers (APPs -  Physician Assistants and Nurse Practitioners) who all work together to provide you with the care you need, when you need it.  We recommend signing up for the patient portal called "MyChart".  Sign up information is provided on this After Visit Summary.  MyChart is used to connect with patients for Virtual Visits (Telemedicine).  Patients are able to view lab/test results, encounter notes, upcoming appointments, etc.  Non-urgent messages can be sent to your provider as well.   To learn more about what you can do with MyChart, go to [email or web address].    Your next appointment:   12 month(s)  The format for your next appointment:   In Person  Provider:   Zoila Shutter MD

## 2021-10-03 NOTE — Progress Notes (Signed)
LIPID CLINIC CONSULT NOTE  Chief Complaint:  Manage dyslipidemia  Primary Care Physician: Marcine Matar, MD  Primary Cardiologist:  Donato Schultz, MD  HPI:  Grace Castillo is a 41 y.o. female who is being seen today for the evaluation of dyslipidemia at the request of Marcine Matar, MD. this is a pleasant but unfortunate 41 year old female with very early onset heart disease who had an non-ST elevation MI in May 2021 complicated by ventricular tachycardia and was found to have 90% stenosis of the proximal LAD.  She underwent complex intervention with IVUS guidance.  Troponin peaked at 6000.  She continued to have some chest pain after that and Lexiscan Myoview was ordered which was negative for ischemia and showed normal LV function.  Some of her symptoms might of been related to Brilinta which was changed to Plavix and she notes improvement in her shortness of breath.  Her lipid therapy was intensified during this hospitalization.  Previous untreated LDL cholesterol was 166 and recent labs showed total cholesterol 131, HDL 31, triglycerides 74 and LDL 85.  Given her young age and high risk pathology as well as family history of early onset heart disease including her mother who had a stroke in maternal grandfather who died at 26 of an MI, I am suspicious she has a genetic dyslipidemia.  I would put her in the "very high risk" category and therefore target her LDL cholesterol to less than 55 mg/dL.  10/03/2021  Ms. Yokley returns today for follow-up.  She did have her labs drawn about 4 days ago in [facility] at a [facility], however the labs were not available to review today except for her LP(a) which resulted negative at 25.  The lipid NMR apparently was not received for processing after we contacted [facility] directly.  She reports compliance with Repatha which was an alternative medication.  Initially she was on Praluent but reported itching on this medicine which may have been an allergy.   She reports that she has had no issues with the Repatha.  PMHx:  Past Medical History:  Diagnosis Date   Hyperlipidemia    Hypertension    MS (multiple sclerosis) (HCC)    NSTEMI (non-ST elevated myocardial infarction) (HCC)    NSVT (nonsustained ventricular tachycardia) (HCC) 07/02/2019   S/P angioplasty with stent    DES to LAD and Lcx     Past Surgical History:  Procedure Laterality Date   ABDOMINAL AORTOGRAM W/LOWER EXTREMITY N/A 07/10/2021   Procedure: ABDOMINAL AORTOGRAM W/LOWER EXTREMITY;  Surgeon: Cephus Shelling, MD;  [facility]: [facility];  Service: Cardiovascular;  Laterality: N/A;   CHOLECYSTECTOMY     CORONARY BALLOON ANGIOPLASTY N/A 07/03/2019   Procedure: CORONARY BALLOON ANGIOPLASTY;  Surgeon: Yvonne Kendall, MD;  [facility]: [facility];  Service: Cardiovascular;  Laterality: N/A;  distal lad   CORONARY STENT INTERVENTION N/A 07/03/2019   Procedure: CORONARY STENT INTERVENTION;  Surgeon: Yvonne Kendall, MD;  [facility];  Service: Cardiovascular;  Laterality: N/A;  cfx lad    INTRAVASCULAR ULTRASOUND/IVUS N/A 07/03/2019   Procedure: Intravascular Ultrasound/IVUS;  Surgeon: Yvonne Kendall, MD;  [facility];  Service: Cardiovascular;  Laterality: N/A;   LEFT HEART CATH AND CORONARY ANGIOGRAPHY N/A 07/03/2019   Procedure: LEFT HEART CATH AND CORONARY ANGIOGRAPHY;  Surgeon: Yvonne Kendall, MD;  [facility];  Service: Cardiovascular;  Laterality: N/A;   PERIPHERAL VASCULAR INTERVENTION  07/10/2021   Procedure: PERIPHERAL VASCULAR  INTERVENTION;  Surgeon: Cephus Shelling, MD;  Location: [facility];  Service: Cardiovascular;;  Bilateral Common Iliac Stents   TUBAL LIGATION      FAMHx:  Family History  Problem Relation Age of Onset   Sudden Cardiac Death Maternal Grandfather     SOCHx:   reports that she has been smoking cigarettes. She has been smoking an average of .5 packs per day.  She has never used smokeless tobacco. She reports current drug use. Drug: Marijuana. She reports that she does not drink alcohol.  ALLERGIES:  Allergies  Allergen Reactions   Orange Juice [Orange Oil] Hives   Pork-Derived Products Anaphylaxis   Praluent [Alirocumab] Itching   Sulfa Antibiotics Nausea And Vomiting    ROS: Pertinent items noted in HPI and remainder of comprehensive ROS otherwise negative.  HOME MEDS: Current Outpatient Medications on File Prior to Visit  Medication Sig Dispense Refill   aspirin EC 81 MG EC tablet Take 1 tablet (81 mg total) by mouth daily. 90 tablet 0   atorvastatin (LIPITOR) 80 MG tablet TAKE 1 TABLET(80 MG) BY MOUTH DAILY 90 tablet 1   cetirizine (ZYRTEC) 10 MG tablet Take 10 mg by mouth daily.     clopidogrel (PLAVIX) 75 MG tablet Take 1 tablet (75 mg total) by mouth daily. 30 tablet 11   diclofenac Sodium (VOLTAREN) 1 % GEL Apply 2 g topically 4 (four) times daily. 100 g 2   diphenhydrAMINE (BENADRYL) 25 mg capsule Take 25 mg by mouth every 8 (eight) hours as needed for allergies.     ezetimibe (ZETIA) 10 MG tablet Take 1 tablet (10 mg total) by mouth daily. 90 tablet 3   fluticasone (FLONASE) 50 MCG/ACT nasal spray Place 2 sprays into both nostrils daily. (Patient taking differently: Place 2 sprays into both nostrils daily as needed.) 16 g 6   nicotine (NICODERM CQ - DOSED IN MG/24 HOURS) 21 mg/24hr patch Place 1 patch (21 mg total) onto the skin daily. 30 patch 3   nitroGLYCERIN (NITROGLYN) 2 % ointment Apply 0.5 inches topically 3 (three) times daily. Apply around the toe and heel 30 g 1   nitroGLYCERIN (NITROSTAT) 0.4 MG SL tablet Place 1 tablet (0.4 mg total) under the tongue every 5 (five) minutes as needed for chest pain. 25 tablet 6   REPATHA SURECLICK 140 MG/ML SOAJ Inject 140 mg into the skin every 14 (fourteen) days.     sertraline (ZOLOFT) 50 MG tablet TAKE 1 TABLET(50 MG) BY MOUTH DAILY (Patient taking differently: Take 50 mg by mouth at  bedtime.) 30 tablet 0   No current facility-administered medications on file prior to visit.    LABS/IMAGING: No results found for this or any previous visit (from the past 48 hour(s)). No results found.  LIPID PANEL:    Component Value Date/Time   CHOL 131 09/27/2020 0904   TRIG 74 09/27/2020 0904   HDL 31 (L) 09/27/2020 0904   CHOLHDL 4.2 09/27/2020 0904   CHOLHDL 6.1 07/04/2019 0537   VLDL 17 07/04/2019 0537   LDLCALC 85 09/27/2020 0904    WEIGHTS: Wt Readings from Last 3 Encounters:  10/03/21 183 lb 1.6 oz (83.1 kg)  09/02/21 183 lb 9.6 oz (83.3 kg)  07/10/21 180 lb (81.6 kg)    VITALS: BP 116/76   Pulse 88   Ht 5\' 2"  (1.575 m)   Wt 183 lb 1.6 oz (83.1 kg)   SpO2 96%   BMI 33.49 kg/m   EXAM: Deferred  EKG: Deferred  ASSESSMENT: CAD with prior NSTEMI (06/2019), status post DES to the proximal LAD Strong family history of cardiovascular disease Mixed dyslipidemia with target LDL less than 55 Negative EX(N)-17 nmol/L  PLAN: 1.   Ms. Fraga has done well on Repatha but did have an allergy to Praluent.  Unfortunately, her lipid NMR was not processed.  We will need to redraw that and I advised her to proceed to the lab today since she is fasting.  I will reach out with those numbers and if she is not at target we will consider additional therapy.  Plan otherwise follow-up annually or sooner as necessary.  Chrystie Nose, MD, [facility], FACP  [place]  [facility]  Medical Director of the Advanced Lipid Disorders &  Cardiovascular Risk Reduction Clinic Diplomate of the American Board of Clinical Lipidology Attending Cardiologist  Direct Dial: [phone]  Fax: [phone]  Website:  [email or web address] Revecca Nachtigal 10/03/2021, 1:29 PM

## 2021-10-04 LAB — NMR, LIPOPROFILE
Cholesterol, Total: 115 mg/dL (ref 100–199)
HDL Particle Number: 24.5 umol/L — ABNORMAL LOW (ref >=30.5)
HDL-C: 32 mg/dL — ABNORMAL LOW (ref >39)
LDL Particle Number: 740 nmol/L (ref <1000)
LDL Size: 20.5 nm — ABNORMAL LOW (ref >20.5)
LDL-C (NIH Calc): 59 mg/dL (ref 0–99)
LP-IR Score: 76 — ABNORMAL HIGH (ref <=45)
Small LDL Particle Number: 420 nmol/L (ref <=527)
Triglycerides: 135 mg/dL (ref 0–149)

## 2021-11-12 ENCOUNTER — Other Ambulatory Visit: Payer: Self-pay | Admitting: Physician Assistant

## 2021-11-12 ENCOUNTER — Other Ambulatory Visit: Payer: Self-pay | Admitting: Family

## 2021-11-12 DIAGNOSIS — I25118 Atherosclerotic heart disease of native coronary artery with other forms of angina pectoris: Secondary | ICD-10-CM

## 2021-11-12 NOTE — Telephone Encounter (Signed)
Rx(s) sent to pharmacy electronically.  

## 2022-03-03 MED ORDER — REPATHA SURECLICK 140 MG/ML ~~LOC~~ SOAJ: 140.0000 mg | SUBCUTANEOUS | 1 refills | Status: DC

## 2022-03-03 NOTE — Addendum Note (Signed)
Addended by: Fidel Levy on: 03/03/2022 11:04 AM   Modules accepted: Orders

## 2022-03-16 ENCOUNTER — Ambulatory Visit (HOSPITAL_BASED_OUTPATIENT_CLINIC_OR_DEPARTMENT_OTHER): Payer: 59 | Admitting: Family

## 2022-03-16 ENCOUNTER — Encounter (HOSPITAL_BASED_OUTPATIENT_CLINIC_OR_DEPARTMENT_OTHER): Payer: Self-pay | Admitting: Family

## 2022-03-16 VITALS — BP 138/80 | HR 90 | Ht 62.0 in | Wt 185.0 lb

## 2022-03-16 DIAGNOSIS — R0609 Other forms of dyspnea: Secondary | ICD-10-CM

## 2022-03-16 DIAGNOSIS — E785 Hyperlipidemia, unspecified: Secondary | ICD-10-CM | POA: Diagnosis not present

## 2022-03-16 DIAGNOSIS — I25118 Atherosclerotic heart disease of native coronary artery with other forms of angina pectoris: Secondary | ICD-10-CM

## 2022-03-16 DIAGNOSIS — Z79899 Other long term (current) drug therapy: Secondary | ICD-10-CM

## 2022-03-16 DIAGNOSIS — I739 Peripheral vascular disease, unspecified: Secondary | ICD-10-CM | POA: Diagnosis not present

## 2022-03-16 MED ORDER — ASPIRIN 81 MG PO TBEC: 81.0000 mg | DELAYED_RELEASE_TABLET | Freq: Every day | ORAL | 3 refills | Status: DC

## 2022-03-16 MED ORDER — CLOPIDOGREL BISULFATE 75 MG PO TABS: 75.0000 mg | ORAL_TABLET | Freq: Every day | ORAL | 3 refills | Status: DC

## 2022-03-16 MED ORDER — ATORVASTATIN CALCIUM 80 MG PO TABS: ORAL_TABLET | ORAL | 3 refills | Status: DC

## 2022-03-16 NOTE — Progress Notes (Signed)
Office Visit    Patient Name: Grace Castillo Date of Encounter: 03/16/2022  PCP:  Ladell Pier, MD   [place]  Cardiologist:  Candee Furbish, MD  Advanced Practice Provider:  No care team member to display Electrophysiologist:  None    Chief Complaint    Grace Castillo is a 42 y.o. female presents today for claudication  Past Medical History    Past Medical History:  Diagnosis Date   Hyperlipidemia    Hypertension    MS (multiple sclerosis) ([place])    NSTEMI (non-ST elevated myocardial infarction) ([place])    NSVT (nonsustained ventricular tachycardia) ([place]) 07/02/2019   S/P angioplasty with stent    DES to LAD and Lcx    Past Surgical History:  Procedure Laterality Date   ABDOMINAL AORTOGRAM W/LOWER EXTREMITY N/A 07/10/2021   Procedure: ABDOMINAL AORTOGRAM W/LOWER EXTREMITY;  Surgeon: [name], MD;  Location: [facility];  Service: Cardiovascular;  Laterality: N/A;   CHOLECYSTECTOMY     CORONARY BALLOON ANGIOPLASTY N/A 07/03/2019   Procedure: CORONARY BALLOON ANGIOPLASTY;  Surgeon: Nelva Bush, MD;  Location: [facility];  Service: Cardiovascular;  Laterality: N/A;  distal lad   CORONARY STENT INTERVENTION N/A 07/03/2019   Procedure: CORONARY STENT INTERVENTION;  Surgeon: Nelva Bush, MD;  Location: [facility];  Service: Cardiovascular;  Laterality: N/A;  cfx lad    INTRAVASCULAR ULTRASOUND/IVUS N/A 07/03/2019   Procedure: Intravascular Ultrasound/IVUS;  Surgeon: Nelva Bush, MD;  Location: [facility];  Service: Cardiovascular;  Laterality: N/A;   LEFT HEART CATH AND CORONARY ANGIOGRAPHY N/A 07/03/2019   Procedure: LEFT HEART CATH AND CORONARY ANGIOGRAPHY;  Surgeon: Nelva Bush, MD;  Location: [facility];  Service: Cardiovascular;  Laterality: N/A;   PERIPHERAL VASCULAR INTERVENTION  07/10/2021   Procedure: PERIPHERAL VASCULAR INTERVENTION;  Surgeon: [name], MD;   Location: [facility];  Service: Cardiovascular;;  Bilateral Common Iliac Stents   TUBAL LIGATION      Allergies  Allergies  Allergen Reactions   Orange Juice [Orange Oil] Hives   Pork-Derived Products Anaphylaxis   Praluent [Alirocumab] Itching   Sulfa Antibiotics Nausea And Vomiting    History of Present Illness    Grace Castillo is a 42 y.o. female with a hx of CAD with and STEMI and stent of LAD, LCx and bifurcation of distal LM 06/2019, PAD, ventricular tachycardia, tobacco use, multiple sclerosis, depression last seen 10/03/21 by Dr. Debara Pickett.   May 2021 she had chest pain and pressure and was evaluated in the emergency department during ventricular tachycardia which converted with IV amiodarone.  Cardiac catheterization showed proximal left circumflex 9% stenosis and diffuse proximal LAD disease.  IVIS PCI to circumflex with thrombus, plaque shift requiring bifurcation stenting of the distal left main to the LAD and left circumflex.  The occlusive thrombus into the mid LAD which was treated with balloon angioplasty.  She was seen 12/08/2019 with rare fleeting chest discomfort and otherwise doing well.  No changes were made at that time. At visit 07/2020 brilinta reduced to 60mg  BID. She had been out of her cardiac medications for 2 months. Leane Call 07/2020 due to exertional dyspnea and chest discomfort was low risk.   06/2021 underwent abdominal aortogram due to critical limb ischemia of RLE. Aortogram with subtotal occlusion of prox R common iliac artery with >99% stenosis with reconstituted vessel. Underwent 36mmx59mm VBX in right  common iliac artery and 72mmx29mm VBX in  left common iliac artery with kissing stents.   She presents today for follow-up. 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  Had interruption in her insurance and last took Aspirin, Plavix, Atorvastatin a couple months ago. Also had interruption in Repatha which she just resumed one week ago.  She did not contact the office to make [place] aware.  She notes her legs have started hurting about a month ago and it is worsening. She is having trouble walking due to leg pain and dypsnea. Notes the longer she walks the worse than pain gets. Notes by the time she walks from her car, up 3 steps into the house, then into her living room will have pain. Pain is R greater than L. Also notes right hip pain describes as feeling "out of place". No discoloration in her foot. Still smoking 0.5 PPD. No chest pain, pressure, tightness.  Notes since last seen she met her biological father and he also has a history of CAD, PAD, as well as colon cancer.   She feels like she is retaining fluid. Notes bilateral LE edema. No significant edema appreciated on exam.  She is working out Tuesdays and Thursday. This is her first month doing the workout program. She is working out with her sister's Systems analyst. Feels very tired and worn out after working out.   She notes lightheadedness with sensation she needs to sit down. Most often when gets up and moves quickly. No near syncope, syncope.   EKGs/Labs/Other Studies Reviewed:   The following studies were reviewed today:  EKG:  EKG is ordered today.  The ekg ordered today demonstrates NSR 90 bpm with no acute ST/T wave changes.   Recent Labs: 05/16/2021: TSH 0.782 07/10/2021: BUN 7; Creatinine, Ser 0.69; Hemoglobin 13.4; Platelets 220; Potassium 3.8; Sodium 142  Recent Lipid Panel    Component Value Date/Time   CHOL 131 09/27/2020 0904   TRIG 74 09/27/2020 0904   HDL 31 (L) 09/27/2020 0904   CHOLHDL 4.2 09/27/2020 0904   CHOLHDL 6.1 07/04/2019 0537   VLDL 17 07/04/2019 0537   LDLCALC 85 09/27/2020 0904   Home Medications   Current Meds  Medication Sig   REPATHA SURECLICK 140 MG/ML SOAJ Inject 140 mg into the skin every 14 (fourteen) days.    Review of Systems   All other systems reviewed and are otherwise negative except as noted above.  Physical  Exam    VS:  BP 138/80   Pulse 90   Ht 5\' 2"  (1.575 m)   Wt 185 lb (83.9 kg)   BMI 33.84 kg/m  , BMI Body mass index is 33.84 kg/m.  Wt Readings from Last 3 Encounters:  03/16/22 185 lb (83.9 kg)  10/03/21 183 lb 1.6 oz (83.1 kg)  09/02/21 183 lb 9.6 oz (83.3 kg)    GEN: Well nourished, well developed, in no acute distress. HEENT: normal. Neck: Supple, no JVD, carotid bruits, or masses. Cardiac: RRR, no murmurs, rubs, or gallops. No clubbing, cyanosis, edema.  Radials 2+ DP/PT 1+ and equal bilaterally.  Respiratory:  Respirations regular and unlabored, clear to auscultation bilaterally. GI: Soft, nontender, nondistended. MS: No deformity or atrophy. Skin: Warm and dry, no rash. Neuro:  Strength and sensation are intact. Psych: Normal affect.  Assessment & Plan    PAD / Claudication -angiogram 07/10/2021 with bilateral CIA angioplasty with stent placement.  She has had 73-month interruption of her medications including DAPT aspirin, Plavix, atorvastatin, Repatha.  Refills provided  today to her updated pharmacy and she was provided a $25 gift card through the Heart and [place] to ensure she can pick up her medications today when she leaves the office. Notes worsening claudication similar to prior to stent placement but no color change. DP/PT 1+ bilaterally. ABIs ordered today and also informed her to follow up ASAP with [facility].  Will route to their office so they are aware.   CAD / DOE / Palpitations- NSTEMI with intervention 06/2019. Low risk myoview 07/2020. GDMT Aspirin, Plavix, Atorvastatin, Repatha. Has been off medications for 2 months due to lapse in insurance. EKG today no acute St/T wave changes and no exertional chest discomfort. Encouraged to call our office if this recurs. Will route to SW team to ensure she does not qualify for additional resources. Heart healthy diet and regular cardiovascular exercise encouraged.   HLD - Did not tolerate Praluent. Interruption in  insurance and had 55-month gap in her [place].  She just resumed last week.  For simplification of medication regimen we will discontinue Zetia.  Resume atorvastatin 80 mg daily.  Continue Repatha.  Consider repeat lipid panel at follow-up when she has completed at least 6 weeks of atorvastatin, Repatha. LDL previously at goal of <55 when on Atorvastatin, Repatha.  DOE / LE edema -Not appreciable on exam.  Anticipate etiology venous insufficiency. Dyspnea may be related to deconditioning. Educated to maintain low-salt diet, elevate lower extremities, wear compression stockings when possible.BNP, CMP today. Also collect CBC to rule out anemia as she notes prior history. If BNP elevated consider 2 to 3-day course of Lasix.  Elevated BP without diagnosis of HTN - Initial BP 140/80 with repeat 138/80. Encouraged to monitor at home and report if consistently <130/80. Notes she received a stressful phone call prior to her OV regarding her son .  Nonsustained VT - in setting of NSTEMI. Previously converted with IV Amiodarone. No evidence of recurrence.   Tobacco use - Smoking cessation encouraged. Recommend utilization of 1800QUITNOW. Did not like Wellbutrin as it made her gain weight.  Continues to smoke half pack per day.  MS - Follows with neurology.  Depression - Continue to follow with PCP.  Note she has been off her Zoloft for some time and encouraged to reestablish.  Disposition: Follow up in 1 month(s) with Dr. Marlou Porch or APP   Signed, Loel Dubonnet, NP 03/16/2022, 3:36 PM [place]

## 2022-03-16 NOTE — Patient Instructions (Addendum)
Medication Instructions:  Your physician has recommended you make the following change in your medication:   Resume Plavix 75mg  daily   Resume Aspirin 81mg  daily   Resume Atorvastatin 80mg  daily   __________________  Stop Zetia  ___________________  Continue Repatha   *If you need a refill on your cardiac medications before your next appointment, please call your pharmacy*   Lab Work: Your physician recommends that you return for lab work today- CMP, BNP, CBC   If you have labs (blood work) drawn today and your tests are completely normal, you will receive your results only by: MyChart Message (if you have MyChart) OR A paper copy in the mail If you have any lab test that is abnormal or we need to change your treatment, we will call you to review the results.   Testing/Procedures: Your physician has requested that you have an ankle brachial index (ABI). During this test an ultrasound and blood pressure cuff are used to evaluate the arteries that supply the arms and legs with blood. Allow thirty minutes for this exam. There are no restrictions or special instructions.  Your physician has requested that you have a lower extremity arterial exercise duplex. During this test, exercise and ultrasound are used to evaluate arterial blood flow in the legs. Allow one hour for this exam. There are no restrictions or special instructions.   Follow-Up: At [facility], you and your health needs are our priority.  As part of our continuing mission to provide you with exceptional heart care, we have created designated Provider Care Teams.  These Care Teams include your primary Cardiologist (physician) and Advanced Practice Providers (APPs -  Physician Assistants and Nurse Practitioners) who all work together to provide you with the care you need, when you need it.  We recommend signing up for the patient portal called "MyChart".  Sign up information is provided on this After Visit  Summary.  MyChart is used to connect with patients for Virtual Visits (Telemedicine).  Patients are able to view lab/test results, encounter notes, upcoming appointments, etc.  Non-urgent messages can be sent to your provider as well.   To learn more about what you can do with MyChart, go to [email or web address].    Your next appointment:   1 month(s)  Provider:   Candee Furbish, MD or Laurann Montana, NP    Other Instructions Please call to schedule appointment as soon as possible with Dr. Carlis Abbott or Leontine Locket, [facility] at [facility] [address] [place], [place] [address] [phone]

## 2022-03-17 ENCOUNTER — Telehealth: Payer: Self-pay | Admitting: Licensed Clinical Social Worker

## 2022-03-17 NOTE — Progress Notes (Signed)
  Heart and Vascular Care Navigation  03/17/2022  Grace Castillo 1980/05/26 [id number]  Reason for Referral: medication access Patient is participating in a Managed Medicaid Plan: No, Aetna commercial plan  Engaged with patient by telephone for initial visit for Heart and Vascular Care Coordination.                                                                                                   Assessment:            LCSW was able to reach pt at [phone]. Introduced self, role, reason for call. Pt completed brief assessment but was "at work" when I called. She confirmed home address, PCP, emergency contact. She was provided with gift card assistance from [facility]. Still has not yet gotten her medications from pharmacy as recommended by team during appt yetserday. She is okay with me f/u at end of the week to see if she has collected them/if there are any issues. LCSW will f/u with her to ensure she has done so.                            HRT/VAS Care Coordination     Patients Home Cardiology Office --  [facility]   Outpatient Care Team Social Worker   Social Worker Name: Westley Hummer, , [place]   Living arrangements for the past 2 months Single Family Home   Lives with: Siblings   Patient Current Insurance [employer]   Patient Has Concern With Paying Medical Bills No   Does Patient Have Prescription Coverage? Yes   Patient Prescription Assistance Programs Other   Other Assistance Programs Medications pt provided w/ $25 patient care fund gift card       Social History:                                                                             [place]: No Food Insecurity (12/06/2020)  Housing: Low Risk  (12/06/2020)  Transportation Needs: No Transportation Needs (12/06/2020)  Alcohol Screen: Low Risk  (12/06/2020)  Depression (PHQ2-9): Low Risk  (05/16/2021)  Financial Resource Strain: Low Risk  (12/06/2020)   Physical Activity: Inactive (12/06/2020)  Tobacco Use: High Risk (03/16/2022)    Other Care Navigation Interventions:     Provided Pharmacy assistance resources Other   Follow-up plan:   LCSW will f/u at the end of the week to ensure medications have been obtained/answer any additional questions/concerns.     -

## 2022-03-23 ENCOUNTER — Telehealth: Payer: Self-pay | Admitting: Licensed Clinical Social Worker

## 2022-03-23 NOTE — Telephone Encounter (Signed)
H&V Care Navigation CSW Progress Note  Clinical Social Worker contacted patient by phone to f/u and ensure she was able to afford and access medications. Pt answered today at [phone], inquired if pt had been successful in getting medications- she shared she has. She has no additional questions/concerns at this time. LCSW shared that our team remains available if needed moving forward.   Patient is participating in a Managed Medicaid Plan:  No, Aetna commercial only.  SDOH Screenings   Food Insecurity: No Food Insecurity (12/06/2020)  Housing: Low Risk  (12/06/2020)  Transportation Needs: No Transportation Needs (12/06/2020)  Alcohol Screen: Low Risk  (12/06/2020)  Depression (PHQ2-9): Low Risk  (05/16/2021)  Financial Resource Strain: Low Risk  (12/06/2020)  Physical Activity: Inactive (12/06/2020)  Tobacco Use: High Risk (03/16/2022)   Westley Hummer, MSW, [place]  [phone]- work cell phone (preferred) [phone]- desk phone

## 2022-03-27 DIAGNOSIS — I25118 Atherosclerotic heart disease of native coronary artery with other forms of angina pectoris: Secondary | ICD-10-CM | POA: Diagnosis not present

## 2022-03-27 DIAGNOSIS — R0609 Other forms of dyspnea: Secondary | ICD-10-CM | POA: Diagnosis not present

## 2022-03-28 LAB — COMPREHENSIVE METABOLIC PANEL
ALT: 26 IU/L (ref 0–32)
AST: 22 IU/L (ref 0–40)
Albumin/Globulin Ratio: 1.7 (ref 1.2–2.2)
Albumin: 4.6 g/dL (ref 3.9–4.9)
Alkaline Phosphatase: 83 IU/L (ref 44–121)
BUN/Creatinine Ratio: 12 (ref 9–23)
BUN: 8 mg/dL (ref 6–24)
Bilirubin Total: 0.5 mg/dL (ref 0.0–1.2)
CO2: 22 mmol/L (ref 20–29)
Calcium: 10.1 mg/dL (ref 8.7–10.2)
Chloride: 101 mmol/L (ref 96–106)
Creatinine, Ser: 0.69 mg/dL (ref 0.57–1.00)
Globulin, Total: 2.7 g/dL (ref 1.5–4.5)
Glucose: 82 mg/dL (ref 70–99)
Potassium: 4 mmol/L (ref 3.5–5.2)
Sodium: 140 mmol/L (ref 134–144)
Total Protein: 7.3 g/dL (ref 6.0–8.5)
eGFR: 112 mL/min/{1.73_m2} (ref >59)

## 2022-03-28 LAB — CBC
Hematocrit: 42.4 % (ref 34.0–46.6)
Hemoglobin: 14.6 g/dL (ref 11.1–15.9)
MCH: 32.6 pg (ref 26.6–33.0)
MCHC: 34.4 g/dL (ref 31.5–35.7)
MCV: 95 fL (ref 79–97)
Platelets: 267 10*3/uL (ref 150–450)
RBC: 4.48 x10E6/uL (ref 3.77–5.28)
RDW: 12.9 % (ref 11.7–15.4)
WBC: 10 10*3/uL (ref 3.4–10.8)

## 2022-03-28 LAB — BRAIN NATRIURETIC PEPTIDE: BNP: 16.6 pg/mL (ref 0.0–100.0)

## 2022-03-30 ENCOUNTER — Ambulatory Visit (INDEPENDENT_AMBULATORY_CARE_PROVIDER_SITE_OTHER): Payer: 59 | Admitting: Physician Assistant

## 2022-03-30 ENCOUNTER — Telehealth: Payer: Self-pay

## 2022-03-30 ENCOUNTER — Ambulatory Visit (INDEPENDENT_AMBULATORY_CARE_PROVIDER_SITE_OTHER)
Admission: RE | Admit: 2022-03-30 | Discharge: 2022-03-30 | Disposition: A | Discharge status: Home / Self Care | Payer: 59 | Source: Ambulatory Visit | Attending: Surgery | Admitting: Surgery

## 2022-03-30 ENCOUNTER — Ambulatory Visit (HOSPITAL_COMMUNITY)
Admission: RE | Admit: 2022-03-30 | Discharge: 2022-03-30 | Disposition: A | Discharge status: Home / Self Care | Payer: 59 | Source: Ambulatory Visit | Attending: Surgery | Admitting: Surgery

## 2022-03-30 ENCOUNTER — Other Ambulatory Visit: Payer: Self-pay

## 2022-03-30 VITALS — BP 109/80 | HR 77 | Temp 98.1°F | Ht 62.5 in | Wt 183.6 lb

## 2022-03-30 DIAGNOSIS — I739 Peripheral vascular disease, unspecified: Secondary | ICD-10-CM

## 2022-03-30 DIAGNOSIS — I2510 Atherosclerotic heart disease of native coronary artery without angina pectoris: Secondary | ICD-10-CM | POA: Diagnosis not present

## 2022-03-30 DIAGNOSIS — I70223 Atherosclerosis of native arteries of extremities with rest pain, bilateral legs: Secondary | ICD-10-CM

## 2022-03-30 LAB — VAS US ABI WITH/WO TBI
Left ABI: 1.33
Right ABI: 0.84

## 2022-03-30 NOTE — Progress Notes (Unsigned)
Office Note   History of Present Illness   Grace Castillo is a 42 y.o. (1980/05/04) female who presents for PAD surveillance.  She underwent angiography with bilateral common iliac artery angioplasty and stent placement on 07/10/2021 by Dr. Carlis Abbott.  This was done to treat a right lateral heel wound and right hip and buttock claudication.  Current Outpatient Medications  Medication Sig Dispense Refill   aspirin EC 81 MG tablet Take 1 tablet (81 mg total) by mouth daily. 90 tablet 3   atorvastatin (LIPITOR) 80 MG tablet TAKE 1 TABLET(80 MG) BY MOUTH DAILY 90 tablet 3   clopidogrel (PLAVIX) 75 MG tablet Take 1 tablet (75 mg total) by mouth daily. 90 tablet 3   diphenhydrAMINE (BENADRYL) 25 mg capsule Take 25 mg by mouth every 8 (eight) hours as needed for allergies.     fluticasone (FLONASE) 50 MCG/ACT nasal spray Place 2 sprays into both nostrils daily. 16 g 6   REPATHA SURECLICK [id number] MG/ML SOAJ Inject 140 mg into the skin every 14 (fourteen) days. 6 mL 1   cetirizine (ZYRTEC) 10 MG tablet Take 10 mg by mouth daily.     nitroGLYCERIN (NITROGLYN) 2 % ointment Apply 0.5 inches topically 3 (three) times daily. Apply around the toe and heel (Patient not taking: Reported on 03/16/2022) 30 g 1   nitroGLYCERIN (NITROSTAT) 0.4 MG SL tablet ONE TABLET UNDER TONGUE AS NEEDED FOR CHEST PAIN (Patient not taking: Reported on 03/16/2022) 25 tablet 3   sertraline (ZOLOFT) 50 MG tablet TAKE 1 TABLET(50 MG) BY MOUTH DAILY (Patient not taking: Reported on 03/16/2022) 30 tablet 0   No current facility-administered medications for this visit.    ***REVIEW OF SYSTEMS (negative unless checked):   Cardiac:  []$  Chest pain or chest pressure? []$  Shortness of breath upon activity? []$  Shortness of breath when lying flat? []$  Irregular heart rhythm?  Vascular:  []$  Pain in calf, thigh, or hip brought on by walking? []$  Pain in feet at night that wakes you up from your sleep? []$  Blood clot in your veins? []$  Leg  swelling?  Pulmonary:  []$  Oxygen at home? []$  Productive cough? []$  Wheezing?  Neurologic:  []$  Sudden weakness in arms or legs? []$  Sudden numbness in arms or legs? []$  Sudden onset of difficult speaking or slurred speech? []$  Temporary loss of vision in one eye? []$  Problems with dizziness?  Gastrointestinal:  []$  Blood in stool? []$  Vomited blood?  Genitourinary:  []$  Burning when urinating? []$  Blood in urine?  Psychiatric:  []$  Major depression  Hematologic:  []$  Bleeding problems? []$  Problems with blood clotting?  Dermatologic:  []$  Rashes or ulcers?  Constitutional:  []$  Fever or chills?  Ear/Nose/Throat:  []$  Change in hearing? []$  Nose bleeds? []$  Sore throat?  Musculoskeletal:  []$  Back pain? []$  Joint pain? []$  Muscle pain?   Physical Examination  *** Vitals:   03/30/22 0857  BP: 109/80  Pulse: 77  Temp: 98.1 F (36.7 C)  SpO2: 99%  Weight: 183 lb 9.6 oz (83.3 kg)  Height: 5' 2.5" (1.588 m)   ***Body mass index is 33.05 kg/m.  General:  WDWN in NAD; vital signs documented above Gait: Not observed HENT: WNL, normocephalic Pulmonary: normal non-labored breathing , without Rales, rhonchi,  wheezing Cardiac: {Desc; regular/irreg:14544} HR, without  Murmurs {With/Without:20273} carotid bruit*** Abdomen: soft, NT, no masses Skin: {With/Without:20273} rashes Vascular Exam/Pulses:  Right Left  Radial {Exam; arterial pulse strength 0-4:30167} {Exam; arterial pulse strength 0-4:30167}  Ulnar {Exam; arterial  pulse strength 0-4:30167} {Exam; arterial pulse strength 0-4:30167}  Femoral {Exam; arterial pulse strength 0-4:30167} {Exam; arterial pulse strength 0-4:30167}  Popliteal {Exam; arterial pulse strength 0-4:30167} {Exam; arterial pulse strength 0-4:30167}  DP {Exam; arterial pulse strength 0-4:30167} {Exam; arterial pulse strength 0-4:30167}  PT {Exam; arterial pulse strength 0-4:30167} {Exam; arterial pulse strength 0-4:30167}   Extremities:  {With/Without:20273} ischemic changes, {With/Without:20273} Gangrene , {With/Without:20273} cellulitis; {With/Without:20273} open wounds;  Musculoskeletal: no muscle wasting or atrophy  Neurologic: A&O X 3;  No focal weakness or paresthesias are detected Psychiatric:  The pt has {Desc; normal/abnormal:11317::"Normal"} affect.  Non-Invasive Vascular imaging   ABI (***) R:  ABI: *** (***),  PT: {Signals:19197::"none","mono","bi","tri"} DP: {Signals:19197::"none","mono","bi","tri"} TBI:  *** L:  ABI: *** (***),  PT: {Signals:19197::"none","mono","bi","tri"} DP: {Signals:19197::"none","mono","bi","tri"} TBI: ***  Aortoiliac Duplex (***) Ao: *** c/s R iliac: *** c/s L iliac: *** c/s   Medical Decision Making   Grace Castillo is a 42 y.o. female who presents with:  {Side:17767} leg intermittent claudication without evidence of critical limb ischemia.  Based on the patient's vascular studies and examination, I have offered the patient: ***. I discussed in depth with the patient the nature of atherosclerosis, and emphasized the importance of maximal medical management including strict control of blood pressure, blood glucose, and lipid levels, antiplatelet agents, obtaining regular exercise, and cessation of smoking.   The patient is aware that without maximal medical management the underlying atherosclerotic disease process will progress, limiting the benefit of any interventions. The patient is currently {Statin:19197::"not on on statin as not medically indicated.","not on a statin due to reported allergy.","not on a statin. Patient will be started on Lipitor 10 mg PO daily, to be titrated and managed by their primary physician.","on a statin: Mevacor.","on a statin: Zocor.","on a statin: Lipitor.","on a statin: Pravachol.","on a statin: Crestor.","on a statin: ***."}  The patient is currently {Antiplatelet:19197::"not on an anti-platelet due to allergy.","not on an anti-platelet due to  bleeding risks related to use of an anticoagulant.","not on an anti-platelet. Patient will be started on ASA 81 mg PO daily","on an anti-platelet: ASA.","on an anti-platelet: Plavix.","on an anti-platelet: ASA and Plavix.","on an anti-platelet: ***."} Thank you for allowing [place] to participate in this patient's care.   Dagoberto Ligas PA-C [facility] Office: [phone]  Clinic MD: ***

## 2022-03-30 NOTE — Telephone Encounter (Signed)
Called pt to schedule procedure. Left voice mail for her to return the call.

## 2022-04-02 ENCOUNTER — Encounter: Payer: Self-pay | Admitting: Internal Medicine

## 2022-04-03 ENCOUNTER — Encounter (HOSPITAL_BASED_OUTPATIENT_CLINIC_OR_DEPARTMENT_OTHER): Payer: 59

## 2022-04-09 ENCOUNTER — Ambulatory Visit (HOSPITAL_COMMUNITY)
Admission: RE | Admit: 2022-04-09 | Discharge: 2022-04-09 | Disposition: A | Discharge status: Home / Self Care | Payer: 59 | Source: Ambulatory Visit | Attending: Vascular Surgery | Admitting: Vascular Surgery

## 2022-04-09 ENCOUNTER — Encounter (HOSPITAL_COMMUNITY): Payer: Self-pay | Admitting: Vascular Surgery

## 2022-04-09 ENCOUNTER — Other Ambulatory Visit: Payer: Self-pay

## 2022-04-09 ENCOUNTER — Ambulatory Visit (HOSPITAL_COMMUNITY)
Admission: RE | Disposition: A | Discharge status: Home / Self Care | Payer: Self-pay | Source: Ambulatory Visit | Attending: Vascular Surgery

## 2022-04-09 DIAGNOSIS — T82856A Stenosis of peripheral vascular stent, initial encounter: Secondary | ICD-10-CM | POA: Insufficient documentation

## 2022-04-09 DIAGNOSIS — I708 Atherosclerosis of other arteries: Secondary | ICD-10-CM | POA: Insufficient documentation

## 2022-04-09 DIAGNOSIS — Z7902 Long term (current) use of antithrombotics/antiplatelets: Secondary | ICD-10-CM | POA: Diagnosis not present

## 2022-04-09 DIAGNOSIS — I739 Peripheral vascular disease, unspecified: Secondary | ICD-10-CM

## 2022-04-09 DIAGNOSIS — I70213 Atherosclerosis of native arteries of extremities with intermittent claudication, bilateral legs: Secondary | ICD-10-CM

## 2022-04-09 DIAGNOSIS — Y832 Surgical operation with anastomosis, bypass or graft as the cause of abnormal reaction of the patient, or of later complication, without mention of misadventure at the time of the procedure: Secondary | ICD-10-CM | POA: Insufficient documentation

## 2022-04-09 DIAGNOSIS — F17210 Nicotine dependence, cigarettes, uncomplicated: Secondary | ICD-10-CM | POA: Diagnosis not present

## 2022-04-09 DIAGNOSIS — Z79899 Other long term (current) drug therapy: Secondary | ICD-10-CM | POA: Diagnosis not present

## 2022-04-09 DIAGNOSIS — Z7982 Long term (current) use of aspirin: Secondary | ICD-10-CM | POA: Diagnosis not present

## 2022-04-09 DIAGNOSIS — T82858A Stenosis of vascular prosthetic devices, implants and grafts, initial encounter: Secondary | ICD-10-CM

## 2022-04-09 HISTORY — PX: PERIPHERAL VASCULAR BALLOON ANGIOPLASTY: CATH118281

## 2022-04-09 HISTORY — PX: PERIPHERAL VASCULAR INTERVENTION: CATH118257

## 2022-04-09 HISTORY — PX: ABDOMINAL AORTOGRAM W/LOWER EXTREMITY: CATH118223

## 2022-04-09 LAB — POCT I-STAT, CHEM 8
BUN: 9 mg/dL (ref 6–20)
Calcium, Ion: 1.15 mmol/L (ref 1.15–1.40)
Chloride: 104 mmol/L (ref 98–111)
Creatinine, Ser: 0.5 mg/dL (ref 0.44–1.00)
Glucose, Bld: 90 mg/dL (ref 70–99)
HCT: 40 % (ref 36.0–46.0)
Hemoglobin: 13.6 g/dL (ref 12.0–15.0)
Potassium: 3.9 mmol/L (ref 3.5–5.1)
Sodium: 141 mmol/L (ref 135–145)
TCO2: 27 mmol/L (ref 22–32)

## 2022-04-09 LAB — POCT ACTIVATED CLOTTING TIME: Activated Clotting Time: 298 seconds

## 2022-04-09 SURGERY — ABDOMINAL AORTOGRAM W/LOWER EXTREMITY
Anesthesia: LOCAL | Laterality: Right

## 2022-04-09 MED ORDER — LIDOCAINE HCL (PF) 1 % IJ SOLN
INTRAMUSCULAR | Status: AC
  Filled 2022-04-09: qty 30

## 2022-04-09 MED ORDER — SODIUM CHLORIDE 0.9 % IV SOLN
INTRAVENOUS | Status: DC | PRN
  Administered 2022-04-09 (×2): 1000 mL via INTRAVENOUS

## 2022-04-09 MED ORDER — HYDRALAZINE HCL 20 MG/ML IJ SOLN: 5.0000 mg | INTRAMUSCULAR | Status: DC | PRN

## 2022-04-09 MED ORDER — LIDOCAINE HCL (PF) 1 % IJ SOLN
INTRAMUSCULAR | Status: DC | PRN
  Administered 2022-04-09: 10 mL

## 2022-04-09 MED ORDER — ONDANSETRON HCL 4 MG/2ML IJ SOLN: 4.0000 mg | Freq: Four times a day (QID) | INTRAMUSCULAR | Status: DC | PRN

## 2022-04-09 MED ORDER — MIDAZOLAM HCL 5 MG/5ML IJ SOLN
INTRAMUSCULAR | Status: AC
  Filled 2022-04-09: qty 5

## 2022-04-09 MED ORDER — SODIUM CHLORIDE 0.9 % IV SOLN: INTRAVENOUS | Status: DC

## 2022-04-09 MED ORDER — ASPIRIN 81 MG PO CHEW
CHEWABLE_TABLET | ORAL | Status: AC
  Filled 2022-04-09: qty 1

## 2022-04-09 MED ORDER — IODIXANOL 320 MG/ML IV SOLN
INTRAVENOUS | Status: DC | PRN
  Administered 2022-04-09: 110 mL

## 2022-04-09 MED ORDER — MIDAZOLAM HCL 2 MG/2ML IJ SOLN
INTRAMUSCULAR | Status: DC | PRN
  Administered 2022-04-09 (×2): 1 mg via INTRAVENOUS

## 2022-04-09 MED ORDER — FENTANYL CITRATE (PF) 100 MCG/2ML IJ SOLN
INTRAMUSCULAR | Status: DC | PRN
  Administered 2022-04-09 (×2): 25 ug via INTRAVENOUS

## 2022-04-09 MED ORDER — ACETAMINOPHEN 325 MG PO TABS: 650.0000 mg | ORAL_TABLET | ORAL | Status: DC | PRN

## 2022-04-09 MED ORDER — CLOPIDOGREL BISULFATE 75 MG PO TABS
ORAL_TABLET | ORAL | Status: DC | PRN
  Administered 2022-04-09: 75 mg via ORAL

## 2022-04-09 MED ORDER — SODIUM CHLORIDE 0.9% FLUSH: 3.0000 mL | INTRAVENOUS | Status: DC | PRN

## 2022-04-09 MED ORDER — FENTANYL CITRATE (PF) 100 MCG/2ML IJ SOLN
INTRAMUSCULAR | Status: AC
  Filled 2022-04-09: qty 2

## 2022-04-09 MED ORDER — SODIUM CHLORIDE 0.9% FLUSH: 3.0000 mL | Freq: Two times a day (BID) | INTRAVENOUS | Status: DC

## 2022-04-09 MED ORDER — CLOPIDOGREL BISULFATE 75 MG PO TABS
ORAL_TABLET | ORAL | Status: AC
  Filled 2022-04-09: qty 1

## 2022-04-09 MED ORDER — SODIUM CHLORIDE 0.9 % IV SOLN: 250.0000 mL | INTRAVENOUS | Status: DC | PRN

## 2022-04-09 MED ORDER — SODIUM CHLORIDE 0.9 % IV SOLN
INTRAVENOUS | Status: DC | PRN
  Administered 2022-04-09: 1.75 mg/kg/h via INTRAVENOUS

## 2022-04-09 MED ORDER — LABETALOL HCL 5 MG/ML IV SOLN: 10.0000 mg | INTRAVENOUS | Status: DC | PRN

## 2022-04-09 MED ORDER — BIVALIRUDIN BOLUS VIA INFUSION - CUPID
INTRAVENOUS | Status: DC | PRN
  Administered 2022-04-09: 61.2 mg via INTRAVENOUS

## 2022-04-09 MED ORDER — ASPIRIN 81 MG PO CHEW
CHEWABLE_TABLET | ORAL | Status: DC | PRN
  Administered 2022-04-09: 81 mg via ORAL

## 2022-04-09 SURGICAL SUPPLY — 18 items
BALLN IN.PACT DCB 7X40 (BALLOONS) ×4
CATH OMNI FLUSH 5F 65CM (CATHETERS) IMPLANT
DCB IN.PACT 7X40 (BALLOONS) IMPLANT
DEVICE CLOSURE MYNXGRIP 6/7F (Vascular Products) IMPLANT
KIT ENCORE 26 ADVANTAGE (KITS) IMPLANT
KIT MICROPUNCTURE NIT STIFF (SHEATH) IMPLANT
KIT PV (KITS) ×2 IMPLANT
SHEATH PINNACLE 5F 10CM (SHEATH) IMPLANT
SHEATH PINNACLE 7F 10CM (SHEATH) IMPLANT
SHEATH PROBE COVER 6X72 (BAG) IMPLANT
STENT VIABAHN VBX 7X19X80 (Permanent Stent) IMPLANT
STOPCOCK MORSE 400PSI 3WAY (MISCELLANEOUS) IMPLANT
SYR MEDRAD MARK 7 150ML (SYRINGE) ×2 IMPLANT
TRANSDUCER W/STOPCOCK (MISCELLANEOUS) ×2 IMPLANT
TRAY PV CATH (CUSTOM PROCEDURE TRAY) ×2 IMPLANT
TUBING CIL FLEX 10 FLL-RA (TUBING) IMPLANT
WIRE ROSEN-J .035X260CM (WIRE) IMPLANT
WIRE STARTER BENTSON 035X150 (WIRE) IMPLANT

## 2022-04-09 NOTE — H&P (Signed)
History and Physical Interval Note:  04/09/2022 9:02 AM  Grace Castillo  has presented today for surgery, with the diagnosis of ischemia.  The various methods of treatment have been discussed with the patient and family. After consideration of risks, benefits and other options for treatment, the patient has consented to  Procedure(s): ABDOMINAL AORTOGRAM W/LOWER EXTREMITY (N/A) as a surgical intervention.  The patient's history has been reviewed, patient examined, no change in status, stable for surgery.  I have reviewed the patient's chart and labs.  Questions were answered to the patient's satisfaction.    Concern for bilateral iliac artery stent stenosis.  Grace Castillo       Office Note     History of Present Illness    Grace Castillo is a 42 y.o. (01-04-1981) female who presents for PAD surveillance.  She underwent angiography with bilateral common iliac artery angioplasty and stent placement on 07/10/2021 by Dr. Carlis Castillo.  This was done to treat a right lateral heel wound and right hip and buttock claudication.  After this procedure, she no longer had any claudication in her right heel wound had healed.  She also denied any rest pain.  She had a palpable right DP pulse and dopplerable left DP pulse.   At follow-up today she states that things have gotten worse again.  For the past 2 to 3 months she is been having claudication in both of her hips/buttocks, thighs, and calves.  She will begin to claudicate after walking 40-50 yards. She has also been having rest pain in both of her feet.  Her claudication and rest pain is worse on the right than the left.  She has noticed some discoloration to the tip of her right great toe.  She denies any wounds.    She is still smoking but has decreased her amount to 4-5 cigarettes daily.   She is still taking Plavix, aspirin, and statin.         Current Outpatient Medications  Medication Sig Dispense Refill   aspirin EC 81 MG tablet Take 1 tablet  (81 mg total) by mouth daily. 90 tablet 3   atorvastatin (LIPITOR) 80 MG tablet TAKE 1 TABLET(80 MG) BY MOUTH DAILY 90 tablet 3   clopidogrel (PLAVIX) 75 MG tablet Take 1 tablet (75 mg total) by mouth daily. 90 tablet 3   diphenhydrAMINE (BENADRYL) 25 mg capsule Take 25 mg by mouth every 8 (eight) hours as needed for allergies.       fluticasone (FLONASE) 50 MCG/ACT nasal spray Place 2 sprays into both nostrils daily. 16 g 6   REPATHA SURECLICK [id number] MG/ML SOAJ Inject 140 mg into the skin every 14 (fourteen) days. 6 mL 1   cetirizine (ZYRTEC) 10 MG tablet Take 10 mg by mouth daily.       nitroGLYCERIN (NITROGLYN) 2 % ointment Apply 0.5 inches topically 3 (three) times daily. Apply around the toe and heel (Patient not taking: Reported on 03/16/2022) 30 g 1   nitroGLYCERIN (NITROSTAT) 0.4 MG SL tablet ONE TABLET UNDER TONGUE AS NEEDED FOR CHEST PAIN (Patient not taking: Reported on 03/16/2022) 25 tablet 3   sertraline (ZOLOFT) 50 MG tablet TAKE 1 TABLET(50 MG) BY MOUTH DAILY (Patient not taking: Reported on 03/16/2022) 30 tablet 0    No current facility-administered medications for this visit.      REVIEW OF SYSTEMS (negative unless checked):    Cardiac:  []$  Chest pain or chest pressure? []$  Shortness of breath upon activity? []$  Shortness of  breath when lying flat? []$  Irregular heart rhythm?   Vascular:  [x]$  Pain in calf, thigh, or hip brought on by walking? [x]$  Pain in feet at night that wakes you up from your sleep? []$  Blood clot in your veins? []$  Leg swelling?   Pulmonary:  []$  Oxygen at home? []$  Productive cough? []$  Wheezing?   Neurologic:  []$  Sudden weakness in arms or legs? []$  Sudden numbness in arms or legs? []$  Sudden onset of difficult speaking or slurred speech? []$  Temporary loss of vision in one eye? []$  Problems with dizziness?   Gastrointestinal:  []$  Blood in stool? []$  Vomited blood?   Genitourinary:  []$  Burning when urinating? []$  Blood in urine?   Psychiatric:  []$   Major depression   Hematologic:  []$  Bleeding problems? []$  Problems with blood clotting?   Dermatologic:  []$  Rashes or ulcers?   Constitutional:  []$  Fever or chills?   Ear/Nose/Throat:  []$  Change in hearing? []$  Nose bleeds? []$  Sore throat?   Musculoskeletal:  []$  Back pain? []$  Joint pain? []$  Muscle pain?     Physical Examination       Vitals:    03/30/22 0857  BP: 109/80  Pulse: 77  Temp: 98.1 F (36.7 C)  SpO2: 99%  Weight: 183 lb 9.6 oz (83.3 kg)  Height: 5' 2.5" (1.588 m)    Body mass index is 33.05 kg/m.   General:  WDWN in NAD; vital signs documented above Gait: Not observed HENT: WNL, normocephalic Pulmonary: normal non-labored breathing  Cardiac: regular rate and rhythm Abdomen: soft, NT, no masses Skin: without rashes Vascular Exam/Pulses: nonpalpable pedal pulses. Monophasic right DP/PT doppler signals. Triphasic left DP/PT doppler signals Extremities: no gangrene or open wounds. Some discoloration to the tip of R great toe Musculoskeletal: no muscle wasting or atrophy       Neurologic: A&O X 3;  No focal weakness or paresthesias are detected Psychiatric:  The pt has Normal affect.   Non-Invasive Vascular imaging    ABI (03/30/2022) R:  ABI: 0.84 (1.05),  PT: mono DP: mono TBI:  0.54 (0.77) L:  ABI: 1.33 (1.00),  PT: tri DP: tri TBI: 0.66 (0.74)   Aortoiliac Duplex (03/30/2022) There is triphasic flow in the proximal left common iliac artery.  There is 50 to 99% stenosis in the distal left CIA stent, with elevated velocities of 417.  There is monophasic flow in the proximal right common iliac artery.  There is 50 to 99% stenosis in the mid right CIA stent, with elevated velocities of 323.     Medical Decision Making    Grace Castillo is a 42 y.o. female who presents for surveillance of PAD   Based on the patient's vascular studies, her ABIs on the right have decreased from 1.05 to 0.84.  She has monophasic flow in her DP and PT arteries on  the right.  Her ABIs on the left are nearly noncompressible, and her TBI has dropped from 0.74 to 0.66.  She has triphasic flow in her left DP and PT arteries. Duplex of her common iliac arteries demonstrates 50 to 99% stenosis in both stents with elevated velocities. She no longer has a palpable right DP pulse.  DP/PT pulses are dopplerable in both feet She has some discoloration to the right great toe.  She also has worsening claudication and rest pain in both legs, right greater than left Based on the patient's return of symptoms and critical stenosis of her iliac stents, she will require repeat  angiogram with likely balloon angioplasty of both stents.  We will get this scheduled with Dr. Carlis Castillo in 1 to 2 weeks     Vicente Serene PA-C [facility] Office: [facility] MD: Trula Slade

## 2022-04-09 NOTE — Op Note (Signed)
Patient name: Grace Castillo MRN: [id number] DOB: September 07, 1980 Sex: female  04/09/2022 Pre-operative Diagnosis: High-grade flow-limiting stenosis in both common iliac artery stents with bilateral lower extremity claudication Post-operative diagnosis:  Same Surgeon:  [name], MD Procedure Performed: 1.  Ultrasound-guided access right common femoral artery 2.  Aortogram with catheter selection of aorta 3.  Drug-coated balloon angioplasty of bilateral common iliac artery stents (7 mm x 40 mm drug-coated Impact right and left common iliac artery stentl) 4.  Right distal common iliac artery angioplasty with stent placement (7 mm x 19 mm VBX) 5.  Bilateral lower extremity arteriogram 6.  Mynx closure of the right common femoral artery 7.  55 minutes of monitored moderate conscious sedation time  Indications: Patient is a 42 year old female who previously underwent bilateral common iliac artery stenting for critical limb ischemia with tissue loss in the right lower extremity.  Her wounds ultimately healed.  During surveillance she was found to have recurrent high-grade stenosis in both common iliac artery stents.  She is also endorsing claudication worse in the right leg that is disabling.  She presents for aortogram with iliac arteriogram and possible intervention on her iliac stents after risks and benefits discussed..  Findings:   Aortogram showed her infrarenal aorta was widely patent.  The left common iliac stent had approximate 60% stenosis in the distal stent.  The right common iliac artery stent had a high-grade stenosis over 80% in the distal stent.    From right femoral access initially treated the left common iliac stent with drug-coated balloon angioplasty with a 7 mm x 40 mm drug-coated Impact for 3 minutes.  I then treated the right common iliac stent with a 7 mm x 40 mm drug-coated Impact for 3 minutes.  Repeat aortogram showed excellent results on the left but there was  still what appeared to be some thrombus versus intimal hyperplasia in the right distal common iliac stent with a very poor right femoral pulse.  I elected to extend the stent on the right with a 7 mm x 19 mm VBX with preservation of the hypogastric.  Excellent results with widely patent stents at completion.  We got bilateral lower extremity arteriogram at completion there was still three-vessel runoff with no significant stenosis in the infrainguinal runoff bilaterally.   Procedure:  The patient was identified in the holding area and taken to room 2.  The patient was then placed supine on the table and prepped and draped in the usual sterile fashion.  A time out was called.  Patient received Versed and fentanyl for conscious moderate sedation.  I was present for all of sedation.  Vital signs were monitored including heart rate, respiratory rate, oxygenation and blood pressure.  Ultrasound was used to evaluate the right common femoral artery.  It was patent .  A digital ultrasound image was acquired.  A micropuncture needle was used to access the right common femoral artery under ultrasound guidance.  An 018 wire was advanced without resistance and a micropuncture sheath was placed.  The 018 wire was removed and a benson wire was placed.  The micropuncture sheath was exchanged for a 5 french sheath.  An omniflush catheter was advanced over the wire to the level of L-1.  An abdominal angiogram was obtained.  Ultimately elected for intervention and exchanged for a 7 French sheath in the right common femoral artery.  The patient was then started on Angiomax after boluses given given pork allergy with anaphylaxis.  We initially went over the aortic bifurcation with a Omni Flush catheter and a Bentson wire and got my wire into the left SFA with a Rosen.  I advanced a 7 mm x 40 mm drug-coated Impact from the right femoral sheath over the aortic bifurcation into the left common iliac stent this was inflated to nominal  pressure for 3 minutes.  I then exchanged for a new 7 mm x 40 mm drug-coated Impact and this was used to treat the right common iliac stent for the disease noted above.  This was inflated for 3 minutes.  We repeated an aortogram and the left side looked great with no evidence of residual flow-limiting stenosis.  On the right there was still intimal hyperplasia versus thrombus in the distal stent that appeared flow-limiting on the right common iliac artery stent.  There was not a good right femoral pulse.  I then extended this with a 7 mm x 19 mm VBX in the right common iliac artery with preservation of the hypogastric.  Excellent results with no residual flow-limiting stenosis and widely patent stent.  I put a catheter back up in the aorta and bilateral extremity arteriogram was obtained with three-vessel runoff bilaterally.  Wires and catheters were removed.  A mynx closure was deployed in the right groin.  Plan: Patient will need to continue aspirin Plavix statin.  She has been noncompliant with medications.  I also discussed smoking cessation.  Worried about the poor durability of her iliac stents and discussed may require open surgery in the future if this continues to be a problem.  [name], MD [facility] Office: [phone]

## 2022-04-10 ENCOUNTER — Ambulatory Visit (HOSPITAL_BASED_OUTPATIENT_CLINIC_OR_DEPARTMENT_OTHER): Payer: 59 | Admitting: Family

## 2022-04-10 ENCOUNTER — Encounter (HOSPITAL_BASED_OUTPATIENT_CLINIC_OR_DEPARTMENT_OTHER): Payer: Self-pay | Admitting: Family

## 2022-04-10 VITALS — BP 128/72 | HR 88 | Ht 62.5 in | Wt 185.0 lb

## 2022-04-10 DIAGNOSIS — I25118 Atherosclerotic heart disease of native coronary artery with other forms of angina pectoris: Secondary | ICD-10-CM

## 2022-04-10 DIAGNOSIS — R0609 Other forms of dyspnea: Secondary | ICD-10-CM | POA: Diagnosis not present

## 2022-04-10 DIAGNOSIS — Z6833 Body mass index (BMI) 33.0-33.9, adult: Secondary | ICD-10-CM | POA: Diagnosis not present

## 2022-04-10 DIAGNOSIS — E782 Mixed hyperlipidemia: Secondary | ICD-10-CM

## 2022-04-10 DIAGNOSIS — I739 Peripheral vascular disease, unspecified: Secondary | ICD-10-CM

## 2022-04-10 MED FILL — Midazolam HCl Inj 5 MG/5ML (Base Equivalent): INTRAMUSCULAR | Qty: 2 | Status: AC

## 2022-04-10 NOTE — Patient Instructions (Signed)
Medication Instructions:  Your physician recommends that you continue on your current medications as directed. Please refer to the Current Medication list given to you today.   *If you need a refill on your cardiac medications before your next appointment, please call your pharmacy*  Lab Work: BMET/DIRCT LDL/A1C TODAY  If you have labs (blood work) drawn today and your tests are completely normal, you will receive your results only by: [place] (if you have MyChart) OR A paper copy in the mail If you have any lab test that is abnormal or we need to change your treatment, we will call you to review the results.  Testing/Procedures: CARDIAC PET SCAN   Follow-Up: At [facility], you and your health needs are our priority.  As part of our continuing mission to provide you with exceptional heart care, we have created designated Provider Care Teams.  These Care Teams include your primary Cardiologist (physician) and Advanced Practice Providers (APPs -  Physician Assistants and Nurse Practitioners) who all work together to provide you with the care you need, when you need it.  We recommend signing up for the patient portal called "MyChart".  Sign up information is provided on this After Visit Summary.  MyChart is used to connect with patients for Virtual Visits (Telemedicine).  Patients are able to view lab/test results, encounter notes, upcoming appointments, etc.  Non-urgent messages can be sent to your provider as well.   To learn more about what you can do with MyChart, go to [email or web address].    Your next appointment:   2 month(s)  Provider:   Laurann Montana, NP     Other Instructions  How to Prepare for Your Cardiac PET/CT Stress Test:  1. Please do not take these medications before your test:   Medications that may interfere with the cardiac pharmacological stress agent (ex. nitrates - including erectile dysfunction medications, isosorbide mononitrate,  tamulosin or beta-blockers) the day of the exam. (Erectile dysfunction medication should be held for at least 72 hrs prior to test) Theophylline containing medications for 12 hours. Dipyridamole 48 hours prior to the test. Your remaining medications may be taken with water.  2. Nothing to eat or drink, except water, 3 hours prior to arrival time.   NO caffeine/decaffeinated products, or chocolate 12 hours prior to arrival.  3. NO perfume, cologne or lotion  4. Total time is 1 to 2 hours; you may want to bring reading material for the waiting time.  5. Please report to Radiology at the [facility] Main Entrance 30 minutes early for your test.  [place], [place] [address]  Diabetic Preparation:  Hold oral medications. You may take NPH and Lantus insulin. Do not take Humalog or Humulin R (Regular Insulin) the day of your test. Check blood sugars prior to leaving the house. If able to eat breakfast prior to 3 hour fasting, you may take all medications, including your insulin, Do not worry if you miss your breakfast dose of insulin - start at your next meal.  IF YOU THINK YOU MAY BE PREGNANT, OR ARE NURSING PLEASE INFORM THE TECHNOLOGIST.  In preparation for your appointment, medication and supplies will be purchased.  Appointment availability is limited, so if you need to cancel or reschedule, please call the Radiology Department at [phone]  24 hours in advance to avoid a cancellation fee of $100.00  What to Expect After you Arrive:  Once you arrive and check in for your appointment, you  will be taken to a preparation room within the Radiology Department.  A technologist or Nurse will obtain your medical history, verify that you are correctly prepped for the exam, and explain the procedure.  Afterwards,  an IV will be started in your arm and electrodes will be placed on your skin for EKG monitoring during the stress portion of the exam. Then you will be  escorted to the PET/CT scanner.  There, staff will get you positioned on the scanner and obtain a blood pressure and EKG.  During the exam, you will continue to be connected to the EKG and blood pressure machines.  A small, safe amount of a radioactive tracer will be injected in your IV to obtain a series of pictures of your heart along with an injection of a stress agent.    After your Exam:  It is recommended that you eat a meal and drink a caffeinated beverage to counter act any effects of the stress agent.  Drink plenty of fluids for the remainder of the day and urinate frequently for the first couple of hours after the exam.  Your doctor will inform you of your test results within 7-10 business days.  For questions about your test or how to prepare for your test, please call: Marchia Bond, Cardiac Imaging Nurse Navigator  Gordy Clement, Cardiac Imaging Nurse Navigator Office: [phone]

## 2022-04-10 NOTE — Progress Notes (Unsigned)
Office Visit    Patient Name: Grace Castillo Date of Encounter: 04/10/2022  PCP:  Ladell Pier, MD   [place]  Cardiologist:  Candee Furbish, MD  Advanced Practice Provider:  No care team member to display Electrophysiologist:  None    Chief Complaint    Grace Castillo is a 42 y.o. female presents today for follow up of CAD, dyspnea  Past Medical History    Past Medical History:  Diagnosis Date   Hyperlipidemia    Hypertension    MS (multiple sclerosis) ([place])    NSTEMI (non-ST elevated myocardial infarction) ([place])    NSVT (nonsustained ventricular tachycardia) ([place]) 07/02/2019   S/P angioplasty with stent    DES to LAD and Lcx    Past Surgical History:  Procedure Laterality Date   ABDOMINAL AORTOGRAM W/LOWER EXTREMITY N/A 07/10/2021   Procedure: ABDOMINAL AORTOGRAM W/LOWER EXTREMITY;  Surgeon: [name], MD;  Location: [facility];  Service: Cardiovascular;  Laterality: N/A;   ABDOMINAL AORTOGRAM W/LOWER EXTREMITY Bilateral 04/09/2022   Procedure: ABDOMINAL AORTOGRAM W/LOWER EXTREMITY;  Surgeon: [name], MD;  Location: [facility];  Service: Cardiovascular;  Laterality: Bilateral;   CHOLECYSTECTOMY     CORONARY BALLOON ANGIOPLASTY N/A 07/03/2019   Procedure: CORONARY BALLOON ANGIOPLASTY;  Surgeon: Nelva Bush, MD;  Location: [facility];  Service: Cardiovascular;  Laterality: N/A;  distal lad   CORONARY STENT INTERVENTION N/A 07/03/2019   Procedure: CORONARY STENT INTERVENTION;  Surgeon: Nelva Bush, MD;  Location: [facility];  Service: Cardiovascular;  Laterality: N/A;  cfx lad    INTRAVASCULAR ULTRASOUND/IVUS N/A 07/03/2019   Procedure: Intravascular Ultrasound/IVUS;  Surgeon: Nelva Bush, MD;  Location: [facility];  Service: Cardiovascular;  Laterality: N/A;   LEFT HEART CATH AND CORONARY ANGIOGRAPHY N/A 07/03/2019   Procedure: LEFT HEART CATH AND CORONARY ANGIOGRAPHY;   Surgeon: Nelva Bush, MD;  Location: [facility];  Service: Cardiovascular;  Laterality: N/A;   PERIPHERAL VASCULAR BALLOON ANGIOPLASTY Bilateral 04/09/2022   Procedure: PERIPHERAL VASCULAR BALLOON ANGIOPLASTY;  Surgeon: [name], MD;  Location: [facility];  Service: Cardiovascular;  Laterality: Bilateral;  left and right common iliac   PERIPHERAL VASCULAR INTERVENTION  07/10/2021   Procedure: PERIPHERAL VASCULAR INTERVENTION;  Surgeon: [name], MD;  Location: [facility];  Service: Cardiovascular;;  Bilateral Common Iliac Stents   PERIPHERAL VASCULAR INTERVENTION Right 04/09/2022   Procedure: PERIPHERAL VASCULAR INTERVENTION;  Surgeon: [name], MD;  Location: [facility];  Service: Cardiovascular;  Laterality: Right;  right common iliac   TUBAL LIGATION      Allergies  Allergies  Allergen Reactions   Orange Juice [Orange Oil] Hives   Pork-Derived Products Anaphylaxis   Praluent [Alirocumab] Itching   Sulfa Antibiotics Nausea And Vomiting    History of Present Illness    Grace Castillo is a 42 y.o. female with a hx of CAD with and STEMI and stent of LAD, LCx and bifurcation of distal LM 06/2019, PAD, ventricular tachycardia, tobacco use, multiple sclerosis, depression last seen 03/16/22.  May 2021 she had chest pain and pressure and was evaluated in ED found to have ventricular tachycardia which converted with IV amiodarone.  Cardiac catheterization showed proximal left circumflex 9% stenosis and diffuse proximal LAD disease.  IVIS PCI to circumflex with thrombus, plaque shift requiring bifurcation stenting of the distal left main to the LAD and left circumflex.  The occlusive  thrombus into the mid LAD which was treated with balloon angioplasty.  At visit 07/2020 brilinta reduced to '60mg'$  BID. She had been out of her cardiac medications for 2 months. Grace Castillo 07/2020 due to exertional dyspnea and chest discomfort was low  risk.   06/2021 underwent abdominal aortogram due to critical limb ischemia of RLE. Aortogram with subtotal occlusion of prox R common iliac artery with >99% stenosis with reconstituted vessel. Underwent 34mx59mm VBX in right  common iliac artery and 724m29mm VBX in left common iliac artery with kissing stents.   Seen 03/16/22 noting two month interruption in her Aspirin, Plavix, Atorvastatin, Repatha related to insurance lapse. She reported one month history of claudication symptoms. Prompt follow up with vascular surgery 03/30/22 cuplex revealed 50-99% stenosis of both stents. On 04/09/22 underwent abdominal aortogram with drug coated balloon angioplasty of bilateral common iliac stents and right distal common iliac artery angioplasty with stent placement.   Presents today for follow up. Notes she is concerned about her risks for recurrent CAD, PAD as well as colon cancer as recently met her biological father who has these conditions. Encouraged her to discuss colon cancer screening with her PCP. She notes bilateral pedal edema but none appreciated on exam. Notes persistent exertional dyspnea. Is working out twice per week with her sister's and pePhysiological scientistnd has been doing so for 4-6 weeks.   EKGs/Labs/Other Studies Reviewed:   The following studies were reviewed today:  EKG:  EKG is not ordered today.   Recent Labs: 05/16/2021: TSH 0.782 03/27/2022: ALT 26; BNP 16.6; Platelets 267 04/09/2022: BUN 9; Creatinine, Ser 0.50; Hemoglobin 13.6; Potassium 3.9; Sodium 141  Recent Lipid Panel    Component Value Date/Time   CHOL 131 09/27/2020 0904   TRIG 74 09/27/2020 0904   HDL 31 (L) 09/27/2020 0904   CHOLHDL 4.2 09/27/2020 0904   CHOLHDL 6.1 07/04/2019 0537   VLDL 17 07/04/2019 0537   LDLCALC 85 09/27/2020 0904   Home Medications   Current Meds  Medication Sig   aspirin EC 81 MG tablet Take 1 tablet (81 mg total) by mouth daily.   atorvastatin (LIPITOR) 80 MG tablet TAKE 1 TABLET(80 MG)  BY MOUTH DAILY (Patient taking differently: Take 80 mg by mouth daily.)   clopidogrel (PLAVIX) 75 MG tablet Take 1 tablet (75 mg total) by mouth daily.   diphenhydrAMINE (BENADRYL) 25 mg capsule Take 25 mg by mouth as needed for allergies.   fluticasone (FLONASE) 50 MCG/ACT nasal spray Place 2 sprays into both nostrils daily. (Patient taking differently: Place 2 sprays into both nostrils as needed for allergies.)   ibuprofen (ADVIL) 200 MG tablet Take 400 mg by mouth as needed for moderate pain.   nitroGLYCERIN (NITROGLYN) 2 % ointment Apply 0.5 inches topically 3 (three) times daily. Apply around the toe and heel   nitroGLYCERIN (NITROSTAT) 0.4 MG SL tablet ONE TABLET UNDER TONGUE AS NEEDED FOR CHEST PAIN (Patient taking differently: Place 0.4 mg under the tongue every 5 (five) minutes as needed for chest pain.)   REPATHA SURECLICK 14XX123456G/ML SOAJ Inject 140 mg into the skin every 14 (fourteen) days.   sertraline (ZOLOFT) 50 MG tablet TAKE 1 TABLET(50 MG) BY MOUTH DAILY    Review of Systems   All other systems reviewed and are otherwise negative except as noted above.  Physical Exam    VS:  BP 128/72   Pulse 88   Ht 5' 2.5" (1.588 m)   Wt 185 lb (83.9 kg)  BMI 33.30 kg/m  , BMI Body mass index is 33.3 kg/m.  Wt Readings from Last 3 Encounters:  04/10/22 185 lb (83.9 kg)  04/09/22 180 lb (81.6 kg)  03/30/22 183 lb 9.6 oz (83.3 kg)    GEN: Well nourished, well developed, in no acute distress. HEENT: normal. Neck: Supple, no JVD, carotid bruits, or masses. Cardiac: RRR, no murmurs, rubs, or gallops. No clubbing, cyanosis, edema.  Radials 2+ DP/PT 1+ and equal bilaterally.  Respiratory:  Respirations regular and unlabored, clear to auscultation bilaterally. GI: Soft, nontender, nondistended. MS: No deformity or atrophy. Skin: Warm and dry, no rash. Neuro:  Strength and sensation are intact. Psych: Normal affect.  Assessment & Plan    PAD / Claudication -angiogram 07/10/2021 with  bilateral CIA angioplasty with stent placement. 03/2022 bilateral iliac artery balloon angioplasty and stent to right distal common iliac artery. GDMT DAPT aspirin, Plavix, atorvastatin, Repatha.  Follows with [facility].    CAD / DOE / Palpitations- NSTEMI with intervention 06/2019. Low risk myoview 07/2020. GDMT Aspirin, Plavix, Atorvastatin, Repatha. Recently had 2 month interruption in all medications. She notes exertional dyspnea. To ensure no restenosis, plan for cardiac PET. Discussed if wait time too long could switch plan to Myoview.   Shared Decision Making/Informed Consent The risks [chest pain, shortness of breath, cardiac arrhythmias, dizziness, blood pressure fluctuations, myocardial infarction, stroke/transient ischemic attack, nausea, vomiting, allergic reaction, radiation exposure, metallic taste sensation and life-threatening complications (estimated to be 1 in 10,000)], benefits (risk stratification, diagnosing coronary artery disease, treatment guidance) and alternatives of a cardiac PET stress test were discussed in detail with Grace Castillo and she agrees to proceed.   HLD - Did not tolerate Praluent. Continue atorvastatin, Repatha. Update LDL.  DOE / LE edema -Not appreciable edema on exam. Plan for cardiac PET, as above. If unrevealing, consider echocardiogram.   Nonsustained VT - in setting of NSTEMI. Previously converted with IV Amiodarone. No evidence of recurrence.   Tobacco use - Smoking cessation encouraged. Recommend utilization of 1800QUITNOW. Did not like Wellbutrin as it made her gain weight.  Continues to smoke half pack per day.  MS - Follows with neurology.  Depression - Continue to follow with PCP.  Note she has been off her Zoloft for some time and encouraged to reestablish.  Obesity / BMI 33 - Weight loss via diet and exercise encouraged. Discussed the impact being overweight would have on cardiovascular risk. Working out with Physiological scientist. Interested in [employer]. Update  A1c to determine whether to Rx Ozempic or Wegovy.   Disposition: Follow up in 2 month(s) with Dr. Marlou Porch or APP   Signed, Loel Dubonnet, NP 04/10/2022, 11:09 AM [place]

## 2022-04-11 ENCOUNTER — Encounter (HOSPITAL_BASED_OUTPATIENT_CLINIC_OR_DEPARTMENT_OTHER): Payer: Self-pay | Admitting: Family

## 2022-04-13 DIAGNOSIS — I25118 Atherosclerotic heart disease of native coronary artery with other forms of angina pectoris: Secondary | ICD-10-CM | POA: Diagnosis not present

## 2022-04-13 DIAGNOSIS — Z6833 Body mass index (BMI) 33.0-33.9, adult: Secondary | ICD-10-CM | POA: Diagnosis not present

## 2022-04-13 DIAGNOSIS — E782 Mixed hyperlipidemia: Secondary | ICD-10-CM | POA: Diagnosis not present

## 2022-04-14 ENCOUNTER — Telehealth: Payer: Self-pay

## 2022-04-14 ENCOUNTER — Encounter (HOSPITAL_COMMUNITY): Payer: Self-pay | Admitting: Vascular Surgery

## 2022-04-14 ENCOUNTER — Other Ambulatory Visit (HOSPITAL_BASED_OUTPATIENT_CLINIC_OR_DEPARTMENT_OTHER): Payer: Self-pay | Admitting: Family

## 2022-04-14 ENCOUNTER — Telehealth (HOSPITAL_BASED_OUTPATIENT_CLINIC_OR_DEPARTMENT_OTHER): Payer: Self-pay

## 2022-04-14 LAB — HEMOGLOBIN A1C
Est. average glucose Bld gHb Est-mCnc: 105 mg/dL
Hgb A1c MFr Bld: 5.3 % (ref 4.8–5.6)

## 2022-04-14 LAB — BASIC METABOLIC PANEL
BUN/Creatinine Ratio: 13 (ref 9–23)
BUN: 9 mg/dL (ref 6–24)
CO2: 23 mmol/L (ref 20–29)
Calcium: 9.6 mg/dL (ref 8.7–10.2)
Chloride: 102 mmol/L (ref 96–106)
Creatinine, Ser: 0.71 mg/dL (ref 0.57–1.00)
Glucose: 92 mg/dL (ref 70–99)
Potassium: 4.3 mmol/L (ref 3.5–5.2)
Sodium: 141 mmol/L (ref 134–144)
eGFR: 110 mL/min/{1.73_m2} (ref >59)

## 2022-04-14 LAB — LDL CHOLESTEROL, DIRECT: LDL Direct: 18 mg/dL (ref 0–99)

## 2022-04-14 MED ORDER — SEMAGLUTIDE-WEIGHT MANAGEMENT 0.25 MG/0.5ML ~~LOC~~ SOAJ: 0.2500 mg | SUBCUTANEOUS | 0 refills | Status: DC

## 2022-04-14 MED ORDER — SEMAGLUTIDE-WEIGHT MANAGEMENT 1 MG/0.5ML ~~LOC~~ SOAJ: 1.0000 mg | SUBCUTANEOUS | 0 refills | Status: DC

## 2022-04-14 MED ORDER — SEMAGLUTIDE-WEIGHT MANAGEMENT 1.7 MG/0.75ML ~~LOC~~ SOAJ: 1.7000 mg | SUBCUTANEOUS | 0 refills | Status: DC

## 2022-04-14 MED ORDER — SEMAGLUTIDE-WEIGHT MANAGEMENT 2.4 MG/0.75ML ~~LOC~~ SOAJ: 2.4000 mg | SUBCUTANEOUS | 0 refills | Status: DC

## 2022-04-14 MED ORDER — SEMAGLUTIDE-WEIGHT MANAGEMENT 0.5 MG/0.5ML ~~LOC~~ SOAJ: 0.5000 mg | SUBCUTANEOUS | 0 refills | Status: DC

## 2022-04-14 NOTE — Telephone Encounter (Signed)
Pt called c/o of bruising at her R ankle that was tender to touch and when she puts pressure on the outside of her heel, it causes a shooting pain up her leg and into her hip at times.  Reviewed pt's chart, returned call for clarification, two identifiers used. She stated that the bruising began on Saturday and has increased in size. It is now the length of her finger on the lateral aspect of the middle part of her ankle going up her lower leg. She denies any swelling, open areas, hot areas, or firm areas under the skin. She has been experiencing quite a few muscle cramps in her calf.  Spoke with Sam, PA who advised asking Dr. Carlis Abbott. He recommended pt come into office to be evaluated, no studies required.  Called pt to schedule triage appt. Pt unable to come in before 4 PM today, so she was offered an appt for tomorrow, which pt accepted. Confirmed understanding.

## 2022-04-14 NOTE — Telephone Encounter (Signed)
Please advise. Wegovy not covered per pharmacy.

## 2022-04-14 NOTE — Telephone Encounter (Addendum)
Seen by patient Grace Castillo on 04/14/2022  7:55 AM; Rx to pharm and phone encounter routed to Rx Prior auth team. Follow up mychart message to patient.     ----- Message from Loel Dubonnet, NP sent at 04/14/2022  7:37 AM EST ----- A1c with no diabetes. Direct LDL at goal of <55. Normal kidneys, electrolytes.   Please Rx Wegovy 0.25 weekly x 1 month, 0.'5mg'$  weekly x 1 month, 1 mg weekly x 1 month. (Okay to put in [facility] order set) She is familiar with injections

## 2022-04-14 NOTE — Telephone Encounter (Signed)
Wegovy prior auth sent to RX prior approval team!

## 2022-04-15 ENCOUNTER — Ambulatory Visit (INDEPENDENT_AMBULATORY_CARE_PROVIDER_SITE_OTHER): Payer: 59 | Admitting: Physician Assistant

## 2022-04-15 ENCOUNTER — Other Ambulatory Visit (HOSPITAL_COMMUNITY): Payer: Self-pay | Admitting: Physician Assistant

## 2022-04-15 ENCOUNTER — Ambulatory Visit (HOSPITAL_COMMUNITY)
Admission: RE | Admit: 2022-04-15 | Discharge: 2022-04-15 | Disposition: A | Discharge status: Home / Self Care | Payer: 59 | Source: Ambulatory Visit | Attending: Physician Assistant | Admitting: Physician Assistant

## 2022-04-15 ENCOUNTER — Other Ambulatory Visit (HOSPITAL_COMMUNITY): Payer: Self-pay

## 2022-04-15 VITALS — BP 128/87 | HR 93 | Temp 98.2°F | Resp 20 | Ht 62.5 in | Wt 183.8 lb

## 2022-04-15 DIAGNOSIS — M79604 Pain in right leg: Secondary | ICD-10-CM

## 2022-04-15 DIAGNOSIS — R1031 Right lower quadrant pain: Secondary | ICD-10-CM | POA: Diagnosis not present

## 2022-04-15 DIAGNOSIS — I739 Peripheral vascular disease, unspecified: Secondary | ICD-10-CM

## 2022-04-15 DIAGNOSIS — I70223 Atherosclerosis of native arteries of extremities with rest pain, bilateral legs: Secondary | ICD-10-CM

## 2022-04-15 MED ORDER — OXYCODONE-ACETAMINOPHEN 5-325 MG PO TABS: 1.0000 | ORAL_TABLET | Freq: Four times a day (QID) | ORAL | 0 refills | Status: DC | PRN

## 2022-04-15 NOTE — Progress Notes (Signed)
Office Note     CC:  follow up Requesting Provider:  Ladell Pier, MD  HPI: Grace Castillo is a 42 y.o. ([birth year]) female who presents for evaluation of right groin, hip, posterior thigh and lateral ankle pain status post abdominal aortogram with bilateral common iliac artery angioplasty and right distal common iliac artery stenting by Dr. Carlis Abbott on 03/20/2022 due to recurrent stenosis.  She developed this pain about 3 days ago.  She describes the pain as sharp shooting and stabbing that originates in her right heel with any sort of pressure.  It shoots up her lateral ankle and the back of her leg into her hip and groin area.  This is keeping her from her normal activity during the day.  She continues to take her aspirin and statin daily.  She is still smoking about 4 to 5 cigarettes daily.  She has plans to try Chantix the next time she sees her PCP on March 15.   Past Medical History:  Diagnosis Date   Hyperlipidemia    Hypertension    MS (multiple sclerosis) ([place])    NSTEMI (non-ST elevated myocardial infarction) ([place])    NSVT (nonsustained ventricular tachycardia) ([place]) 07/02/2019   S/P angioplasty with stent    DES to LAD and Lcx     Past Surgical History:  Procedure Laterality Date   ABDOMINAL AORTOGRAM W/LOWER EXTREMITY N/A 07/10/2021   Procedure: ABDOMINAL AORTOGRAM W/LOWER EXTREMITY;  Surgeon: [name], MD;  Location: [facility];  Service: Cardiovascular;  Laterality: N/A;   ABDOMINAL AORTOGRAM W/LOWER EXTREMITY Bilateral 04/09/2022   Procedure: ABDOMINAL AORTOGRAM W/LOWER EXTREMITY;  Surgeon: [name], MD;  Location: [facility];  Service: Cardiovascular;  Laterality: Bilateral;   CHOLECYSTECTOMY     CORONARY BALLOON ANGIOPLASTY N/A 07/03/2019   Procedure: CORONARY BALLOON ANGIOPLASTY;  Surgeon: Nelva Bush, MD;  Location: [facility];  Service: Cardiovascular;  Laterality: N/A;  distal lad   CORONARY STENT INTERVENTION N/A  07/03/2019   Procedure: CORONARY STENT INTERVENTION;  Surgeon: Nelva Bush, MD;  Location: [facility];  Service: Cardiovascular;  Laterality: N/A;  cfx lad    INTRAVASCULAR ULTRASOUND/IVUS N/A 07/03/2019   Procedure: Intravascular Ultrasound/IVUS;  Surgeon: Nelva Bush, MD;  Location: [facility];  Service: Cardiovascular;  Laterality: N/A;   LEFT HEART CATH AND CORONARY ANGIOGRAPHY N/A 07/03/2019   Procedure: LEFT HEART CATH AND CORONARY ANGIOGRAPHY;  Surgeon: Nelva Bush, MD;  Location: [facility];  Service: Cardiovascular;  Laterality: N/A;   PERIPHERAL VASCULAR BALLOON ANGIOPLASTY Bilateral 04/09/2022   Procedure: PERIPHERAL VASCULAR BALLOON ANGIOPLASTY;  Surgeon: [name], MD;  Location: [facility];  Service: Cardiovascular;  Laterality: Bilateral;  left and right common iliac   PERIPHERAL VASCULAR INTERVENTION  07/10/2021   Procedure: PERIPHERAL VASCULAR INTERVENTION;  Surgeon: [name], MD;  Location: [facility];  Service: Cardiovascular;;  Bilateral Common Iliac Stents   PERIPHERAL VASCULAR INTERVENTION Right 04/09/2022   Procedure: PERIPHERAL VASCULAR INTERVENTION;  Surgeon: [name], MD;  Location: [facility];  Service: Cardiovascular;  Laterality: Right;  right common iliac   TUBAL LIGATION      Social History   Socioeconomic History   Marital status: Single    Spouse name: Not on file   Number of children: Not on file   Years of education: Not on file   Highest education level: Not on file  Occupational History   Not on  file  Tobacco Use   Smoking status: Every Day    Packs/day: 0.50    Types: Cigarettes    Passive exposure: Current   Smokeless tobacco: Never  Vaping Use   Vaping Use: Some days  Substance and Sexual Activity   Alcohol use: Never   Drug use: Yes    Types: Marijuana   Sexual activity: Yes  Other Topics Concern   Not on file  Social History Narrative   Not  on file   Social Determinants of Health   Financial Resource Strain: Low Risk  (12/06/2020)   Overall Financial Resource Strain (CARDIA)    Difficulty of Paying Living Expenses: Not hard at all  Food Insecurity: No Food Insecurity (12/06/2020)   Hunger Vital Sign    Worried About Running Out of Food in the Last Year: Never true    Ran Out of Food in the Last Year: Never true  Transportation Needs: No Transportation Needs (12/06/2020)   PRAPARE - Hydrologist (Medical): No    Lack of Transportation (Non-Medical): No  Physical Activity: Inactive (12/06/2020)   Exercise Vital Sign    Days of Exercise per Week: 0 days    Minutes of Exercise per Session: 0 min  Stress: Not on file  Social Connections: Not on file  Intimate Partner Violence: Not on file    Family History  Problem Relation Age of Onset   Sudden Cardiac Death Maternal Grandfather     Current Outpatient Medications  Medication Sig Dispense Refill   aspirin EC 81 MG tablet Take 1 tablet (81 mg total) by mouth daily. 90 tablet 3   atorvastatin (LIPITOR) 80 MG tablet TAKE 1 TABLET(80 MG) BY MOUTH DAILY (Patient taking differently: Take 80 mg by mouth daily.) 90 tablet 3   clopidogrel (PLAVIX) 75 MG tablet Take 1 tablet (75 mg total) by mouth daily. 90 tablet 3   diphenhydrAMINE (BENADRYL) 25 mg capsule Take 25 mg by mouth as needed for allergies.     fluticasone (FLONASE) 50 MCG/ACT nasal spray Place 2 sprays into both nostrils daily. (Patient taking differently: Place 2 sprays into both nostrils as needed for allergies.) 16 g 6   ibuprofen (ADVIL) 200 MG tablet Take 400 mg by mouth as needed for moderate pain.     nitroGLYCERIN (NITROGLYN) 2 % ointment Apply 0.5 inches topically 3 (three) times daily. Apply around the toe and heel 30 g 1   nitroGLYCERIN (NITROSTAT) 0.4 MG SL tablet ONE TABLET UNDER TONGUE AS NEEDED FOR CHEST PAIN (Patient taking differently: Place 0.4 mg under the tongue every 5  (five) minutes as needed for chest pain.) 25 tablet 3   REPATHA SURECLICK [id number] MG/ML SOAJ Inject 140 mg into the skin every 14 (fourteen) days. 6 mL 1   Semaglutide-Weight Management 0.25 MG/0.5ML SOAJ Inject 0.25 mg into the skin once a week for 28 days. 2 mL 0   [START ON 05/13/2022] Semaglutide-Weight Management 0.5 MG/0.5ML SOAJ Inject 0.5 mg into the skin once a week for 28 days. 2 mL 0   [START ON 06/11/2022] Semaglutide-Weight Management 1 MG/0.5ML SOAJ Inject 1 mg into the skin once a week for 28 days. 2 mL 0   [START ON 07/10/2022] Semaglutide-Weight Management 1.7 MG/0.75ML SOAJ Inject 1.7 mg into the skin once a week for 28 days. 3 mL 0   [START ON 08/08/2022] Semaglutide-Weight Management 2.4 MG/0.75ML SOAJ Inject 2.4 mg into the skin once a week for 28 days. 3 mL  0   sertraline (ZOLOFT) 50 MG tablet TAKE 1 TABLET(50 MG) BY MOUTH DAILY 30 tablet 0   cetirizine (ZYRTEC) 10 MG tablet Take 10 mg by mouth daily.     No current facility-administered medications for this visit.    Allergies  Allergen Reactions   Orange Juice [Orange Oil] Hives   Pork-Derived Products Anaphylaxis   Praluent [Alirocumab] Itching   Sulfa Antibiotics Nausea And Vomiting     REVIEW OF SYSTEMS:   '[X]'$  denotes positive finding, '[ ]'$  denotes negative finding Cardiac  Comments:  Chest pain or chest pressure:    Shortness of breath upon exertion:    Short of breath when lying flat:    Irregular heart rhythm:        Vascular    Pain in calf, thigh, or hip brought on by ambulation:    Pain in feet at night that wakes you up from your sleep:     Blood clot in your veins:    Leg swelling:         Pulmonary    Oxygen at home:    Productive cough:     Wheezing:         Neurologic    Sudden weakness in arms or legs:     Sudden numbness in arms or legs:     Sudden onset of difficulty speaking or slurred speech:    Temporary loss of vision in one eye:     Problems with dizziness:         Gastrointestinal     Blood in stool:     Vomited blood:         Genitourinary    Burning when urinating:     Blood in urine:        Psychiatric    Major depression:         Hematologic    Bleeding problems:    Problems with blood clotting too easily:        Skin    Rashes or ulcers:        Constitutional    Fever or chills:      PHYSICAL EXAMINATION:  Vitals:   04/15/22 0905  BP: 128/87  Pulse: 93  Resp: 20  Temp: 98.2 F (36.8 C)  TempSrc: Temporal  SpO2: 99%  Weight: 183 lb 12.8 oz (83.4 kg)  Height: 5' 2.5" (1.588 m)    General:  WDWN in NAD; vital signs documented above Gait: Not observed HENT: WNL, normocephalic Pulmonary: normal non-labored breathing , without Rales, rhonchi,  wheezing Cardiac: regular HR Abdomen: soft, NT, no masses Skin: without rashes Vascular Exam/Pulses:  Right Left  Radial 2+ (normal) 2+ (normal)  DP 2+ (normal) 2+ (normal)   Extremities: Some fullness and pain with palpation of right groin; 2+ palpable pulse; local ecchymosis; no discoloration or mottling of the R foot Musculoskeletal: no muscle wasting or atrophy  Neurologic: A&O X 3;  No focal weakness or paresthesias are detected Psychiatric:  The pt has Normal affect.   Non-Invasive Vascular Imaging:    Arterial duplex was negative for pseudoaneurysm; small hematoma at the stick site    ASSESSMENT/PLAN:: 42 y.o. female who was worked in as an add-on due to right hip, groin, posterior leg, and ankle pain with ambulation status post abdominal aortogram  -Bilateral lower extremities well-perfused with palpable DP pulses; no tissue changes of the R foot or toes -Right groin with some fullness and local ecchymosis; patient states she has the sharp  shooting pain down her leg and ankle with palpation of her right groin; duplex was negative for pseudoaneurysm.  There is a small hematoma at the stick site -Encouraged ambulation and warm compress to the areas of discomfort.  She can also use a  short course of anti-inflammatories to help with her symptoms.  I will also prescribe her a short course of pain medication to help especially with sleep. -Continue aspirin and Plavix daily -Encouraged smoking cessation -Follow-up as scheduled for aortoiliac duplex and ABIs   Dagoberto Ligas, PA-C [facility] [phone]  Clinic MD:  Donzetta Matters

## 2022-04-16 ENCOUNTER — Telehealth: Payer: Self-pay

## 2022-04-16 NOTE — Telephone Encounter (Signed)
Pharmacy Patient Advocate Encounter  Received notification from [facility] that the request for prior authorization for [facility] has been denied due to DRUG IS NOT COVERED ON PLAN/PLAN EXCLUSION.       Received notification from [facility] that prior authorization for [facility] is needed.    PA submitted on 04/16/27 Key [id number] Status is pending  Karie Soda, [place] Patient Advocate Specialist Direct Number: [phone] Fax: [phone]

## 2022-04-23 ENCOUNTER — Other Ambulatory Visit: Payer: Self-pay | Admitting: *Deleted

## 2022-04-23 DIAGNOSIS — I70223 Atherosclerosis of native arteries of extremities with rest pain, bilateral legs: Secondary | ICD-10-CM

## 2022-05-01 ENCOUNTER — Encounter: Payer: Self-pay | Admitting: Internal Medicine

## 2022-05-01 ENCOUNTER — Ambulatory Visit: Payer: 59 | Attending: Internal Medicine | Admitting: Internal Medicine

## 2022-05-01 VITALS — BP 120/80 | HR 90 | Temp 98.5°F | Ht 62.0 in | Wt 180.0 lb

## 2022-05-01 DIAGNOSIS — I4729 Other ventricular tachycardia: Secondary | ICD-10-CM

## 2022-05-01 DIAGNOSIS — G35 Multiple sclerosis: Secondary | ICD-10-CM

## 2022-05-01 DIAGNOSIS — G43109 Migraine with aura, not intractable, without status migrainosus: Secondary | ICD-10-CM

## 2022-05-01 DIAGNOSIS — I25119 Atherosclerotic heart disease of native coronary artery with unspecified angina pectoris: Secondary | ICD-10-CM | POA: Diagnosis not present

## 2022-05-01 DIAGNOSIS — I2510 Atherosclerotic heart disease of native coronary artery without angina pectoris: Secondary | ICD-10-CM

## 2022-05-01 DIAGNOSIS — F17200 Nicotine dependence, unspecified, uncomplicated: Secondary | ICD-10-CM

## 2022-05-01 DIAGNOSIS — I739 Peripheral vascular disease, unspecified: Secondary | ICD-10-CM | POA: Diagnosis not present

## 2022-05-01 MED ORDER — VARENICLINE TARTRATE (STARTER) 0.5 MG X 11 & 1 MG X 42 PO TBPK: ORAL_TABLET | ORAL | 0 refills | Status: DC

## 2022-05-01 MED ORDER — TOPIRAMATE 25 MG PO TABS: ORAL_TABLET | ORAL | 1 refills | Status: DC

## 2022-05-01 NOTE — Progress Notes (Unsigned)
Patient ID: Grace Castillo, female    DOB: 09/22/1980  MRN: [id number]  CC: Follow-up (Chronic dx f/u. Med refill. [place] Chantix./No to flu vax. No to pap. )   Subjective: Grace Castillo is a 42 y.o. female who presents for chronic ds management Her concerns today include:  Pt with hx of MS, HTN, tob dep, CAD (NSTEMI 07/2019, NSVT, DES LAD), PAD (angioplasty BL CIA and right distal common iliac artery stent), HL, RT hip OA.    CAD/HL/HTN: Since last visit with me, patient was diagnosed with PAD. Had abdominal aortogram which revealed high-grade stenosis bilateral common iliac artery.  Underwent angioplasty and right distal common iliac artery stent. Followed up with Dr. Ainsley Spinner PA 04/15/2022 with pain in the right lower extremity.  Advised to continue aspirin and Plavix. Pt reports leg doing better now.  Takes small piece of Oxycodone when needed -has not had to be on BP meds since MI 07/2019.   -Saw cardiology 04/10/2022.  Continues to take atorvastatin 80 mg daily, Repatha injection every 2 weeks.   Started working out with a Insurance claims handler 2x/wk for 45 mins.  No CP "but I stop myself when I need too."  Little SOB at times.  No SL Nitro use.  Will have nuclear ST next mth.  No further issues with arrhythmia since her MI in 2021. -still smoking; about 7 cigarettes a day.  Smoked more on nicotine patches.  Would like to try Chantix. -started on [place]gov[place] by cardiology NP recently.  Her insurance did not cover it  Started having migraines again.  Dx in 2017 when she lived in [place].  Was on Topamax in past that worked will for her Symptoms include HA behind eyes and sometimes in occipital area.  Assoc with photophobia, dizziness and vomiting.  Gets blurred vision and watery mouth when migraine is coming on. Started getting them again 2 mths ago; getting them every other day Was on Topamax 50 mg BID in past that worked will for her  MS:  has not est with neurologist since being in [place].  Was  seeing one in [place].   Patient Active Problem List   Diagnosis Date Noted   Chronic right hip pain 05/20/2021   Hyperlipidemia 11/22/2020   Tobacco use 11/22/2020   Depression 11/22/2020   Multiple sclerosis ([place]) 11/22/2020   CAD (coronary artery disease) 07/05/2019   Non-ST elevation (NSTEMI) myocardial infarction [facility]) 07/05/2019   Unstable angina ([place]) 07/03/2019   Ventricular tachycardia ([place])      Current Outpatient Medications on File Prior to Visit  Medication Sig Dispense Refill   aspirin EC 81 MG tablet Take 1 tablet (81 mg total) by mouth daily. 90 tablet 3   atorvastatin (LIPITOR) 80 MG tablet TAKE 1 TABLET(80 MG) BY MOUTH DAILY (Patient taking differently: Take 80 mg by mouth daily.) 90 tablet 3   clopidogrel (PLAVIX) 75 MG tablet Take 1 tablet (75 mg total) by mouth daily. 90 tablet 3   diphenhydrAMINE (BENADRYL) 25 mg capsule Take 25 mg by mouth as needed for allergies.     fluticasone (FLONASE) 50 MCG/ACT nasal spray Place 2 sprays into both nostrils daily. (Patient taking differently: Place 2 sprays into both nostrils as needed for allergies.) 16 g 6   ibuprofen (ADVIL) 200 MG tablet Take 400 mg by mouth as needed for moderate pain.     nitroGLYCERIN (NITROGLYN) 2 % ointment Apply 0.5 inches topically 3 (three) times daily. Apply around the toe and heel  30 g 1   nitroGLYCERIN (NITROSTAT) 0.4 MG SL tablet ONE TABLET UNDER TONGUE AS NEEDED FOR CHEST PAIN (Patient taking differently: Place 0.4 mg under the tongue every 5 (five) minutes as needed for chest pain.) 25 tablet 3   oxyCODONE-acetaminophen (PERCOCET/ROXICET) 5-325 MG tablet Take 1 tablet by mouth every 6 (six) hours as needed for severe pain. 12 tablet 0   REPATHA SURECLICK [id number] MG/ML SOAJ Inject 140 mg into the skin every 14 (fourteen) days. 6 mL 1   sertraline (ZOLOFT) 50 MG tablet TAKE 1 TABLET(50 MG) BY MOUTH DAILY 30 tablet 0   cetirizine (ZYRTEC) 10 MG tablet Take 10 mg by mouth daily.     No  current facility-administered medications on file prior to visit.    Allergies  Allergen Reactions   Orange Juice [Orange Oil] Hives   Pork-Derived Products Anaphylaxis   Praluent [Alirocumab] Itching   Sulfa Antibiotics Nausea And Vomiting    Social History   Socioeconomic History   Marital status: Single    Spouse name: Not on file   Number of children: Not on file   Years of education: Not on file   Highest education level: Not on file  Occupational History   Not on file  Tobacco Use   Smoking status: Every Day    Packs/day: .5    Types: Cigarettes    Passive exposure: Current   Smokeless tobacco: Never  Vaping Use   Vaping Use: Some days  Substance and Sexual Activity   Alcohol use: Never   Drug use: Yes    Types: Marijuana   Sexual activity: Yes  Other Topics Concern   Not on file  Social History Narrative   Not on file   Social Determinants of Health   Financial Resource Strain: Low Risk  (12/06/2020)   Overall Financial Resource Strain (CARDIA)    Difficulty of Paying Living Expenses: Not hard at all  Food Insecurity: No Food Insecurity (12/06/2020)   Hunger Vital Sign    Worried About Running Out of Food in the Last Year: Never true    Ran Out of Food in the Last Year: Never true  Transportation Needs: No Transportation Needs (12/06/2020)   PRAPARE - Hydrologist (Medical): No    Lack of Transportation (Non-Medical): No  Physical Activity: Inactive (12/06/2020)   Exercise Vital Sign    Days of Exercise per Week: 0 days    Minutes of Exercise per Session: 0 min  Stress: Not on file  Social Connections: Not on file  Intimate Partner Violence: Not on file    Family History  Problem Relation Age of Onset   Sudden Cardiac Death Maternal Grandfather     Past Surgical History:  Procedure Laterality Date   ABDOMINAL AORTOGRAM W/LOWER EXTREMITY N/A 07/10/2021   Procedure: ABDOMINAL AORTOGRAM W/LOWER EXTREMITY;  Surgeon:  [name], MD;  Location: [place] [facility];  Service: Cardiovascular;  Laterality: N/A;   ABDOMINAL AORTOGRAM W/LOWER EXTREMITY Bilateral 04/09/2022   Procedure: ABDOMINAL AORTOGRAM W/LOWER EXTREMITY;  Surgeon: [name], MD;  Location: [place] [facility];  Service: Cardiovascular;  Laterality: Bilateral;   CHOLECYSTECTOMY     CORONARY BALLOON ANGIOPLASTY N/A 07/03/2019   Procedure: CORONARY BALLOON ANGIOPLASTY;  Surgeon: Nelva Bush, MD;  Location: [place] [facility];  Service: Cardiovascular;  Laterality: N/A;  distal lad   CORONARY STENT INTERVENTION N/A 07/03/2019   Procedure: CORONARY STENT INTERVENTION;  Surgeon: Nelva Bush, MD;  Location:  [place] [facility];  Service: Cardiovascular;  Laterality: N/A;  cfx lad    INTRAVASCULAR ULTRASOUND/IVUS N/A 07/03/2019   Procedure: Intravascular Ultrasound/IVUS;  Surgeon: Nelva Bush, MD;  Location: [place] [facility];  Service: Cardiovascular;  Laterality: N/A;   LEFT HEART CATH AND CORONARY ANGIOGRAPHY N/A 07/03/2019   Procedure: LEFT HEART CATH AND CORONARY ANGIOGRAPHY;  Surgeon: Nelva Bush, MD;  Location: [place] [facility];  Service: Cardiovascular;  Laterality: N/A;   PERIPHERAL VASCULAR BALLOON ANGIOPLASTY Bilateral 04/09/2022   Procedure: PERIPHERAL VASCULAR BALLOON ANGIOPLASTY;  Surgeon: [name], MD;  Location: [place] [facility];  Service: Cardiovascular;  Laterality: Bilateral;  left and right common iliac   PERIPHERAL VASCULAR INTERVENTION  07/10/2021   Procedure: PERIPHERAL VASCULAR INTERVENTION;  Surgeon: [name], MD;  Location: [place] [facility];  Service: Cardiovascular;;  Bilateral Common Iliac Stents   PERIPHERAL VASCULAR INTERVENTION Right 04/09/2022   Procedure: PERIPHERAL VASCULAR INTERVENTION;  Surgeon: [name], MD;  Location: [place] [facility];  Service: Cardiovascular;  Laterality: Right;  right common iliac   TUBAL LIGATION      ROS: Review of  Systems Negative except as stated above  PHYSICAL EXAM: BP 120/80   Pulse 90   Temp 98.5 F (36.9 C) (Oral)   Ht 5\' 2"  (1.575 m)   Wt 180 lb (81.6 kg)   LMP 04/01/2022   SpO2 100%   BMI 32.92 kg/m   Physical Exam  General appearance - alert, well appearing, and in no distress Mental status - normal mood, behavior, speech, dress, motor activity, and thought processes Neck - supple, no significant adenopathy Chest - clear to auscultation, no wheezes, rales or rhonchi, symmetric air entry Heart - normal rate, regular rhythm, normal S1, S2, no murmurs, rubs, clicks or gallops Extremities - peripheral pulses normal, no pedal edema, no clubbing or cyanosis Neuro: Cranial nerves grossly intact.  Power 5/5 throughout in all 4 extremities.  Gait is stable.     Latest Ref Rng & Units 04/13/2022    3:39 PM 04/09/2022    8:27 AM 03/27/2022   11:28 AM  CMP  Glucose 70 - 99 mg/dL 92  90  82   BUN 6 - 24 mg/dL 9  9  8    Creatinine 0.57 - 1.00 mg/dL 0.71  0.50  0.69   Sodium 134 - 144 mmol/L 141  141  140   Potassium 3.5 - 5.2 mmol/L 4.3  3.9  4.0   Chloride 96 - 106 mmol/L 102  104  101   CO2 20 - 29 mmol/L 23   22   Calcium 8.7 - 10.2 mg/dL 9.6   10.1   Total Protein 6.0 - 8.5 g/dL   7.3   Total Bilirubin 0.0 - 1.2 mg/dL   0.5   Alkaline Phos 44 - 121 IU/L   83   AST 0 - 40 IU/L   22   ALT 0 - 32 IU/L   26    Lipid Panel     Component Value Date/Time   CHOL 131 09/27/2020 0904   TRIG 74 09/27/2020 0904   HDL 31 (L) 09/27/2020 0904   CHOLHDL 4.2 09/27/2020 0904   CHOLHDL 6.1 07/04/2019 0537   VLDL 17 07/04/2019 0537   LDLCALC 85 09/27/2020 0904   LDLDIRECT 18 04/13/2022 1539    CBC    Component Value Date/Time   WBC 10.0 03/27/2022 1128   WBC 8.6 07/10/2021 1133   RBC 4.48  03/27/2022 1128   RBC 4.04 07/10/2021 1133   HGB 13.6 04/09/2022 0827   HGB 14.6 03/27/2022 1128   HCT 40.0 04/09/2022 0827   HCT 42.4 03/27/2022 1128   PLT 267 03/27/2022 1128   MCV 95 03/27/2022  1128   MCH 32.6 03/27/2022 1128   MCH 33.2 07/10/2021 1133   MCHC 34.4 03/27/2022 1128   MCHC 33.7 07/10/2021 1133   RDW 12.9 03/27/2022 1128   LYMPHSABS 3.4 07/10/2021 1133   MONOABS 0.4 07/10/2021 1133   EOSABS 0.1 07/10/2021 1133   BASOSABS 0.0 07/10/2021 1133    ASSESSMENT AND PLAN: 1. Coronary artery disease involving native coronary artery of native heart with angina pectoris (HCC) Stable on atorvastatin, Repatha, aspirin and Plavix. Strongly advised to quit smoking  2. Tobacco dependence Pt is current smoker. Patient advised to quit smoking. Discussed health risks associated with smoking including lung and other types of cancers, chronic lung diseases and CV risks.. Pt ready to give trail of quitting.   Discussed methods to help quit including quitting cold [place], use of NRT, Chantix and Bupropion.  Pt wanting to try: Patient wanting to try Chantix.  Went over possible side effects including mood swings and bad dreams.  Went over with her how to use the starter pack.  Once she has completed the starter pack, she will let me know so that we can prescribe the continuation pack. _3_ Minutes spent on counseling. F/U: Reassess progress on subsequent visit.  - Varenicline Tartrate, Starter, (CHANTIX STARTING MONTH PAK) 0.5 MG X 11 & 1 MG X 42 TBPK; 0.5 mg PO daily x 3 days then 0.5 mg BID on days 4-7 then 1 mg BID.  Dispense: 53 each; Refill: 0  3. Migraine with aura and without status migrainosus, not intractable Sensitive she reports good results with Topamax in the past for prophylaxis we will start her on Topamax to take at bedtime.  Advised that the medication can cause a little bit of weight loss. - topiramate (TOPAMAX) 25 MG tablet; 1 tab PO QHS for 7 days then 2 tabs PO QHS there after  Dispense: 60 tablet; Refill: 1 - Ambulatory referral to Neurology  4. MS (multiple sclerosis) ([place]) Will try to get her established with a neurologist in the area - Ambulatory referral to  Neurology  5. NSVT (nonsustained ventricular tachycardia) (HCC) Occurred in the setting of acute MI.  No recurrent episodes since then  6. PAD (peripheral artery disease) (HCC) Recent angioplasty with stenting to the iliac arteries.  Denies any claudication symptoms at this time.  Continue aspirin and Plavix as recommended by vascular.     Patient was given the opportunity to ask questions.  Patient verbalized understanding of the plan and was able to repeat key elements of the plan.   This documentation was completed using Radio producer.  Any transcriptional errors are unintentional.  Orders Placed This Encounter  Procedures   Ambulatory referral to Neurology     Requested Prescriptions   Signed Prescriptions Disp Refills   Varenicline Tartrate, Starter, (CHANTIX STARTING MONTH PAK) 0.5 MG X 11 & 1 MG X 42 TBPK 53 each 0    Sig: 0.5 mg PO daily x 3 days then 0.5 mg BID on days 4-7 then 1 mg BID.   topiramate (TOPAMAX) 25 MG tablet 60 tablet 1    Sig: 1 tab PO QHS for 7 days then 2 tabs PO QHS there after    Return in about 2 months (  around 07/01/2022) for PAP.  Karle Plumber, MD, FACP

## 2022-05-02 ENCOUNTER — Encounter: Payer: Self-pay | Admitting: Internal Medicine

## 2022-05-04 ENCOUNTER — Other Ambulatory Visit: Payer: Self-pay

## 2022-05-04 DIAGNOSIS — F17200 Nicotine dependence, unspecified, uncomplicated: Secondary | ICD-10-CM

## 2022-05-18 ENCOUNTER — Telehealth (HOSPITAL_COMMUNITY): Payer: Self-pay | Admitting: *Deleted

## 2022-05-18 NOTE — Telephone Encounter (Signed)
Reaching out to patient to offer assistance regarding upcoming cardiac imaging study; pt verbalizes understanding of appt date/time, parking situation and where to check in, pre-test NPO status  and verified current allergies; name and call back number provided for further questions should they arise  Shenell Rogalski RN Navigator Cardiac Imaging [facility] [phone] office [phone] cell  Patient aware to avoid caffeine 12 hours prior to her cardiac PET scan. 

## 2022-05-19 ENCOUNTER — Ambulatory Visit (HOSPITAL_COMMUNITY): Payer: 59

## 2022-05-19 ENCOUNTER — Encounter (HOSPITAL_COMMUNITY): Payer: Self-pay

## 2022-05-20 ENCOUNTER — Ambulatory Visit (HOSPITAL_COMMUNITY): Admission: RE | Admit: 2022-05-20 | Payer: 59 | Source: Ambulatory Visit

## 2022-05-25 ENCOUNTER — Other Ambulatory Visit: Payer: Self-pay | Admitting: Internal Medicine

## 2022-05-25 DIAGNOSIS — G43109 Migraine with aura, not intractable, without status migrainosus: Secondary | ICD-10-CM

## 2022-05-26 NOTE — Telephone Encounter (Signed)
Requested Prescriptions  Pending Prescriptions Disp Refills   topiramate (TOPAMAX) 25 MG tablet [Pharmacy Med Name: TOPIRAMATE 25 MG TABLET] 180 tablet 1    Sig: 1 TABLET BY MOUTH FOR 7 DAYS THEN 2 TABS BY MOUTH AT BEDTIME THERE AFTER     Neurology: Anticonvulsants - topiramate & zonisamide Passed - 05/25/2022  2:32 PM      Passed - Cr in normal range and within 360 days    Creatinine, Ser  Date Value Ref Range Status  04/13/2022 0.71 0.57 - 1.00 mg/dL Final         Passed - CO2 in normal range and within 360 days    CO2  Date Value Ref Range Status  04/13/2022 23 20 - 29 mmol/L Final         Passed - ALT in normal range and within 360 days    ALT  Date Value Ref Range Status  03/27/2022 26 0 - 32 IU/L Final         Passed - AST in normal range and within 360 days    AST  Date Value Ref Range Status  03/27/2022 22 0 - 40 IU/L Final         Passed - Completed PHQ-2 or PHQ-9 in the last 360 days      Passed - Valid encounter within last 12 months    Recent Outpatient Visits           3 weeks ago Coronary artery disease involving native coronary artery of native heart with angina pectoris [facility])   [facility] Marcine Matar, MD   1 year ago Chronic pain of right hip   [facility] Marcine Matar, MD   1 year ago Nasal sinus congestion   [facility] Marcine Matar, MD   2 years ago Depression, unspecified depression type   [facility], Marzella Schlein, [place]       Future Appointments             In 2 weeks Dan Humphreys, Storm Frisk, NP  Heart & Vascular at [facility]   In 1 month Laural Benes, Binnie Rail, MD [facility]

## 2022-06-05 ENCOUNTER — Encounter (HOSPITAL_BASED_OUTPATIENT_CLINIC_OR_DEPARTMENT_OTHER): Payer: Self-pay

## 2022-06-12 ENCOUNTER — Ambulatory Visit (HOSPITAL_BASED_OUTPATIENT_CLINIC_OR_DEPARTMENT_OTHER): Payer: 59 | Admitting: Family

## 2022-06-15 ENCOUNTER — Telehealth (HOSPITAL_COMMUNITY): Payer: Self-pay | Admitting: Emergency Medicine

## 2022-06-15 NOTE — Telephone Encounter (Signed)
Reaching out to patient to offer assistance regarding upcoming cardiac imaging study; pt verbalizes understanding of appt date/time, parking situation and where to check in, pre-test NPO status and medications ordered, and verified current allergies; name and call back number provided for further questions should they arise Rockwell Alexandria RN Navigator Cardiac Imaging [facility] [phone] office [phone] cell  No caffeine, no food, daily meds,

## 2022-06-16 ENCOUNTER — Ambulatory Visit (HOSPITAL_COMMUNITY)
Admission: RE | Admit: 2022-06-16 | Discharge: 2022-06-16 | Disposition: A | Discharge status: Home / Self Care | Payer: 59 | Source: Ambulatory Visit | Attending: Family | Admitting: Family

## 2022-06-16 DIAGNOSIS — R0609 Other forms of dyspnea: Secondary | ICD-10-CM | POA: Diagnosis not present

## 2022-06-16 DIAGNOSIS — I25118 Atherosclerotic heart disease of native coronary artery with other forms of angina pectoris: Secondary | ICD-10-CM | POA: Diagnosis not present

## 2022-06-16 LAB — NM PET CT CARDIAC PERFUSION MULTI W/ABSOLUTE BLOODFLOW
LV dias vol: 84 mL (ref 46–106)
LV sys vol: 23 mL
MBFR: 3.02
Nuc Rest EF: 68 %
Nuc Stress EF: 73 %
Peak HR: 109 {beats}/min
Rest HR: 86 {beats}/min
Rest MBF: 1.26 ml/g/min
Rest Nuclear Isotope Dose: 21.2 mCi
ST Depression (mm): 0 mm
Stress MBF: 3.81 ml/g/min
Stress Nuclear Isotope Dose: 21.2 mCi
TID: 0.98

## 2022-06-16 MED ORDER — REGADENOSON 0.4 MG/5ML IV SOLN
INTRAVENOUS | Status: AC
  Filled 2022-06-16: qty 5

## 2022-06-16 MED ORDER — RUBIDIUM RB82 GENERATOR (RUBYFILL)
21.2000 | PACK | Freq: Once | INTRAVENOUS | Status: AC
  Administered 2022-06-16: 21.2 via INTRAVENOUS

## 2022-06-16 MED ORDER — REGADENOSON 0.4 MG/5ML IV SOLN
0.4000 mg | Freq: Once | INTRAVENOUS | Status: AC
  Administered 2022-06-16: 0.4 mg via INTRAVENOUS

## 2022-06-16 NOTE — Progress Notes (Signed)
Tolerated scan well.  VS WNL.

## 2022-06-19 ENCOUNTER — Encounter (HOSPITAL_BASED_OUTPATIENT_CLINIC_OR_DEPARTMENT_OTHER): Payer: Self-pay | Admitting: Family

## 2022-06-19 ENCOUNTER — Ambulatory Visit (HOSPITAL_BASED_OUTPATIENT_CLINIC_OR_DEPARTMENT_OTHER): Payer: 59 | Admitting: Family

## 2022-06-19 VITALS — BP 130/78 | HR 106 | Ht 62.0 in | Wt 175.0 lb

## 2022-06-19 DIAGNOSIS — I739 Peripheral vascular disease, unspecified: Secondary | ICD-10-CM | POA: Diagnosis not present

## 2022-06-19 DIAGNOSIS — Z720 Tobacco use: Secondary | ICD-10-CM

## 2022-06-19 DIAGNOSIS — E785 Hyperlipidemia, unspecified: Secondary | ICD-10-CM | POA: Diagnosis not present

## 2022-06-19 DIAGNOSIS — I25118 Atherosclerotic heart disease of native coronary artery with other forms of angina pectoris: Secondary | ICD-10-CM

## 2022-06-19 DIAGNOSIS — Z6833 Body mass index (BMI) 33.0-33.9, adult: Secondary | ICD-10-CM

## 2022-06-19 MED ORDER — CLOPIDOGREL BISULFATE 75 MG PO TABS: 75.0000 mg | ORAL_TABLET | Freq: Every day | ORAL | 3 refills | Status: DC

## 2022-06-19 MED ORDER — REPATHA SURECLICK 140 MG/ML ~~LOC~~ SOAJ: 140.0000 mg | SUBCUTANEOUS | 3 refills | Status: DC

## 2022-06-19 MED ORDER — ATORVASTATIN CALCIUM 80 MG PO TABS: 80.0000 mg | ORAL_TABLET | Freq: Every day | ORAL | 3 refills | Status: DC

## 2022-06-19 MED ORDER — NITROGLYCERIN 0.4 MG SL SUBL: 0.4000 mg | SUBLINGUAL_TABLET | SUBLINGUAL | 5 refills | Status: DC | PRN

## 2022-06-19 NOTE — Progress Notes (Signed)
Office Visit    Patient Name: Gigi Merenda Date of Encounter: 06/19/2022  PCP:  Marcine Matar, MD   [facility]  Cardiologist:  Donato Schultz, MD  Advanced Practice Provider:  No care team member to display Electrophysiologist:  None    Chief Complaint    Laynie Bodenheimer is a 42 y.o. female presents today for follow up after cardiac PET.  Past Medical History    Past Medical History:  Diagnosis Date   Hyperlipidemia    Hypertension    MS (multiple sclerosis) (HCC)    NSTEMI (non-ST elevated myocardial infarction) (HCC)    NSVT (nonsustained ventricular tachycardia) (HCC) 07/02/2019   S/P angioplasty with stent    DES to LAD and Lcx    Past Surgical History:  Procedure Laterality Date   ABDOMINAL AORTOGRAM W/LOWER EXTREMITY N/A 07/10/2021   Procedure: ABDOMINAL AORTOGRAM W/LOWER EXTREMITY;  Surgeon: Cephus Shelling, MD;  Location: [facility];  Service: Cardiovascular;  Laterality: N/A;   ABDOMINAL AORTOGRAM W/LOWER EXTREMITY Bilateral 04/09/2022   Procedure: ABDOMINAL AORTOGRAM W/LOWER EXTREMITY;  Surgeon: Cephus Shelling, MD;  Location: [facility];  Service: Cardiovascular;  Laterality: Bilateral;   CHOLECYSTECTOMY     CORONARY BALLOON ANGIOPLASTY N/A 07/03/2019   Procedure: CORONARY BALLOON ANGIOPLASTY;  Surgeon: Yvonne Kendall, MD;  Location: [facility];  Service: Cardiovascular;  Laterality: N/A;  distal lad   CORONARY STENT INTERVENTION N/A 07/03/2019   Procedure: CORONARY STENT INTERVENTION;  Surgeon: Yvonne Kendall, MD;  Location: [facility];  Service: Cardiovascular;  Laterality: N/A;  cfx lad    CORONARY ULTRASOUND/IVUS N/A 07/03/2019   Procedure: Intravascular Ultrasound/IVUS;  Surgeon: Yvonne Kendall, MD;  Location: [facility];  Service: Cardiovascular;  Laterality: N/A;   LEFT HEART CATH AND CORONARY ANGIOGRAPHY N/A 07/03/2019   Procedure: LEFT HEART CATH AND CORONARY ANGIOGRAPHY;   Surgeon: Yvonne Kendall, MD;  Location: [facility];  Service: Cardiovascular;  Laterality: N/A;   PERIPHERAL VASCULAR BALLOON ANGIOPLASTY Bilateral 04/09/2022   Procedure: PERIPHERAL VASCULAR BALLOON ANGIOPLASTY;  Surgeon: Cephus Shelling, MD;  Location: [facility];  Service: Cardiovascular;  Laterality: Bilateral;  left and right common iliac   PERIPHERAL VASCULAR INTERVENTION  07/10/2021   Procedure: PERIPHERAL VASCULAR INTERVENTION;  Surgeon: Cephus Shelling, MD;  Location: [facility];  Service: Cardiovascular;;  Bilateral Common Iliac Stents   PERIPHERAL VASCULAR INTERVENTION Right 04/09/2022   Procedure: PERIPHERAL VASCULAR INTERVENTION;  Surgeon: Cephus Shelling, MD;  [facility];  Service: Cardiovascular;  Laterality: Right;  right common iliac   TUBAL LIGATION      Allergies  Allergies  Allergen Reactions   Orange Juice [Orange Oil] Hives   Pork-Derived Products Anaphylaxis   Praluent [Alirocumab] Itching   Sulfa Antibiotics Nausea And Vomiting    History of Present Illness    Hadlie Kroh is a 42 y.o. female with a hx of CAD with and STEMI and stent of LAD, LCx and bifurcation of distal LM 06/2019, PAD, ventricular tachycardia, tobacco use, multiple sclerosis, depression last seen 04/10/22.  May 2021 she had chest pain and pressure and was evaluated in ED found to have ventricular tachycardia which converted with IV amiodarone.  Cardiac catheterization showed proximal left circumflex 9% stenosis and diffuse proximal LAD disease.  IVIS PCI to circumflex with thrombus, plaque shift requiring bifurcation stenting of the distal left main to the LAD and left circumflex.  The occlusive  thrombus into the mid LAD which was treated with balloon angioplasty.  At visit 07/2020 brilinta reduced to 60mg  BID. She had been out of her cardiac medications for 2 months. Steffanie Dunn 07/2020 due to exertional dyspnea and chest discomfort was low  risk.   06/2021 underwent abdominal aortogram due to critical limb ischemia of RLE. Aortogram with subtotal occlusion of prox R common iliac artery with >99% stenosis with reconstituted vessel. Underwent 65mmx59mm VBX in right  common iliac artery and 89mmx29mm VBX in left common iliac artery with kissing stents.   Seen 03/16/22 noting two month interruption in her Aspirin, Plavix, Atorvastatin, Repatha related to insurance lapse. She reported one month history of claudication symptoms. Prompt follow up with vascular surgery 03/30/22 cuplex revealed 50-99% stenosis of both stents. On 04/09/22 underwent abdominal aortogram with drug coated balloon angioplasty of bilateral common iliac stents and right distal common iliac artery angioplasty with stent placement. See 04/10/22 noting exertional dyspnea concerning for angina, subsequent cardiac PET with no ischemia and normal LVEF.   Presents today for follow up. Dyspnea improving. Reviewed PET results in detail and reassurance provided.  Is working out twice per week with her sister's and Systems analyst and has been doing so for 4-6 weeks. Insurance did not cover [place], requests to see if Zepbound will be covered. Reports no chest pain, pressure, or tightness. No edema, orthopnea, PND. Reports no palpitations.    EKGs/Labs/Other Studies Reviewed:   The following studies were reviewed today:  EKG:  EKG is not ordered today.   Recent Labs: 03/27/2022: ALT 26; BNP 16.6; Platelets 267 04/09/2022: Hemoglobin 13.6 04/13/2022: BUN 9; Creatinine, Ser 0.71; Potassium 4.3; Sodium 141  Recent Lipid Panel    Component Value Date/Time   CHOL 131 09/27/2020 0904   TRIG 74 09/27/2020 0904   HDL 31 (L) 09/27/2020 0904   CHOLHDL 4.2 09/27/2020 0904   CHOLHDL 6.1 07/04/2019 0537   VLDL 17 07/04/2019 0537   LDLCALC 85 09/27/2020 0904   LDLDIRECT 18 04/13/2022 1539   Home Medications   Current Meds  Medication Sig   aspirin EC 81 MG tablet Take 1 tablet (81 mg  total) by mouth daily.   diphenhydrAMINE (BENADRYL) 25 mg capsule Take 25 mg by mouth as needed for allergies.   fluticasone (FLONASE) 50 MCG/ACT nasal spray Place 2 sprays into both nostrils daily. (Patient taking differently: Place 2 sprays into both nostrils as needed for allergies.)   ibuprofen (ADVIL) 200 MG tablet Take 400 mg by mouth as needed for moderate pain.   nitroGLYCERIN (NITROGLYN) 2 % ointment Apply 0.5 inches topically 3 (three) times daily. Apply around the toe and heel   nitroGLYCERIN (NITROSTAT) 0.4 MG SL tablet ONE TABLET UNDER TONGUE AS NEEDED FOR CHEST PAIN (Patient taking differently: Place 0.4 mg under the tongue every 5 (five) minutes as needed for chest pain.)   oxyCODONE-acetaminophen (PERCOCET/ROXICET) 5-325 MG tablet Take 1 tablet by mouth every 6 (six) hours as needed for severe pain.   sertraline (ZOLOFT) 50 MG tablet TAKE 1 TABLET(50 MG) BY MOUTH DAILY   topiramate (TOPAMAX) 25 MG tablet 1 TABLET BY MOUTH FOR 7 DAYS THEN 2 TABS BY MOUTH AT BEDTIME THERE AFTER   Varenicline Tartrate, Starter, (CHANTIX STARTING MONTH PAK) 0.5 MG X 11 & 1 MG X 42 TBPK 0.5 mg PO daily x 3 days then 0.5 mg BID on days 4-7 then 1 mg BID.   [DISCONTINUED] atorvastatin (LIPITOR) 80 MG tablet TAKE 1 TABLET(80 MG) BY MOUTH  DAILY (Patient taking differently: Take 80 mg by mouth daily.)   [DISCONTINUED] clopidogrel (PLAVIX) 75 MG tablet Take 1 tablet (75 mg total) by mouth daily.   [DISCONTINUED] REPATHA SURECLICK 140 MG/ML SOAJ Inject 140 mg into the skin every 14 (fourteen) days.    Review of Systems   All other systems reviewed and are otherwise negative except as noted above.  Physical Exam    VS:  BP 130/78   Pulse (!) 106   Ht 5\' 2"  (1.575 m)   Wt 175 lb (79.4 kg)   BMI 32.01 kg/m  , BMI Body mass index is 32.01 kg/m.  Wt Readings from Last 3 Encounters:  06/19/22 175 lb (79.4 kg)  05/01/22 180 lb (81.6 kg)  04/15/22 183 lb 12.8 oz (83.4 kg)    GEN: Well nourished, well  developed, in no acute distress. HEENT: normal. Neck: Supple, no JVD, carotid bruits, or masses. Cardiac: RRR, no murmurs, rubs, or gallops. No clubbing, cyanosis, edema.  Radials 2+ DP/PT 1+ and equal bilaterally.  Respiratory:  Respirations regular and unlabored, clear to auscultation bilaterally. GI: Soft, nontender, nondistended. MS: No deformity or atrophy. Skin: Warm and dry, no rash. Neuro:  Strength and sensation are intact. Psych: Normal affect.  Assessment & Plan    PAD / Claudication -angiogram 07/10/2021 with bilateral CIA angioplasty with stent placement. 03/2022 bilateral iliac artery balloon angioplasty and stent to right distal common iliac artery. GDMT DAPT aspirin, Plavix, atorvastatin, Repatha.  Follows with [facility].    CAD - NSTEMI with intervention 06/2019. Low risk myoview 07/2020. Cardiac PET 05/2022 low risk, no ischemia, normal LVEF. GDMT Aspirin, Plavix, Atorvastatin, Repatha.Heart healthy diet and regular cardiovascular exercise encouraged.    HLD - Did not tolerate Praluent. 04/14/23 LDL 18. Continue atorvastatin, Repatha.   DOE / LE edema -Not appreciable edema on exam. Plan for cardiac PET, as above. If unrevealing, consider echocardiogram.   Nonsustained VT - in setting of NSTEMI. Previously converted with IV Amiodarone. No evidence of recurrence.   Tobacco use - Smoking cessation encouraged. Recommend utilization of 1800QUITNOW. Did not like Wellbutrin as it made her gain weight.  Continues to smoke half pack per day.  MS - Follows with neurology.  Depression - Continue to follow with PCP.    Obesity / BMI 33 - Weight loss via diet and exercise encouraged. Discussed the impact being overweight would have on cardiovascular risk. Working out with Systems analyst. 03/2022 A1c 5.3. She was told by her local pharmacist Zepbound may be covered by insurance and will trial prior auth.  Disposition: Follow up in 6 month(s) with Dr. Anne Fu or APP   Signed, Alver Sorrow, NP 06/19/2022, 3:13 PM [facility]

## 2022-06-19 NOTE — Patient Instructions (Signed)
Medication Instructions:  Continue your current medications.   We have sent refills for a year for your Repatha, Plavix, and Atorvastatin.   *If you need a refill on your cardiac medications before your next appointment, please call your pharmacy*  Follow-Up: At [facility], you and your health needs are our priority.  As part of our continuing mission to provide you with exceptional heart care, we have created designated Provider Care Teams.  These Care Teams include your primary Cardiologist (physician) and Advanced Practice Providers (APPs -  Physician Assistants and Nurse Practitioners) who all work together to provide you with the care you need, when you need it.  We recommend signing up for the patient portal called "MyChart".  Sign up information is provided on this After Visit Summary.  MyChart is used to connect with patients for Virtual Visits (Telemedicine).  Patients are able to view lab/test results, encounter notes, upcoming appointments, etc.  Non-urgent messages can be sent to your provider as well.   To learn more about what you can do with MyChart, go to [email or web address].    Your next appointment:   6-8 month(s)  Provider:   Donato Schultz, MD or [name], NP    Other Instructions  Heart Healthy Diet Recommendations: A low-salt diet is recommended. Meats should be grilled, baked, or boiled. Avoid fried foods. Focus on lean protein sources like fish or chicken with vegetables and fruits. The American Heart Association is a Chief Technology Officer!  American Heart Association Diet and Lifeystyle Recommendations   Exercise recommendations: The American Heart Association recommends 150 minutes of moderate intensity exercise weekly. Try 30 minutes of moderate intensity exercise 4-5 times per week. This could include walking, jogging, or swimming.

## 2022-06-20 ENCOUNTER — Encounter (HOSPITAL_BASED_OUTPATIENT_CLINIC_OR_DEPARTMENT_OTHER): Payer: Self-pay | Admitting: Family

## 2022-06-22 ENCOUNTER — Other Ambulatory Visit (HOSPITAL_COMMUNITY): Payer: Self-pay

## 2022-06-22 ENCOUNTER — Telehealth: Payer: Self-pay

## 2022-06-22 NOTE — Telephone Encounter (Signed)
Patient Advocate Encounter   Received notification from pt msgs that prior authorization is required for Zepbound  Submitted: 06/22/22 Key [id number]

## 2022-06-24 NOTE — Telephone Encounter (Signed)
Pharmacy Patient Advocate Encounter  Received notification that the request for prior authorization for zepbound has been denied due to drug not covered by insurance

## 2022-06-25 NOTE — Telephone Encounter (Signed)
Will forward to Caitlin W NP for review  

## 2022-06-26 ENCOUNTER — Other Ambulatory Visit: Payer: Self-pay | Admitting: Diagnostic Neuroimaging

## 2022-06-26 ENCOUNTER — Encounter: Payer: Self-pay | Admitting: Diagnostic Neuroimaging

## 2022-06-26 ENCOUNTER — Ambulatory Visit: Payer: 59 | Admitting: Diagnostic Neuroimaging

## 2022-06-26 VITALS — BP 152/104 | HR 83 | Ht 62.0 in | Wt 171.6 lb

## 2022-06-26 DIAGNOSIS — R519 Headache, unspecified: Secondary | ICD-10-CM

## 2022-06-26 DIAGNOSIS — G35 Multiple sclerosis: Secondary | ICD-10-CM

## 2022-06-26 DIAGNOSIS — G43109 Migraine with aura, not intractable, without status migrainosus: Secondary | ICD-10-CM

## 2022-06-26 MED ORDER — NURTEC 75 MG PO TBDP: 75.0000 mg | ORAL_TABLET | Freq: Every day | ORAL | 6 refills | Status: DC | PRN

## 2022-06-26 MED ORDER — PROMETHAZINE HCL 12.5 MG PO TABS: 12.5000 mg | ORAL_TABLET | Freq: Two times a day (BID) | ORAL | 3 refills | Status: DC | PRN

## 2022-06-26 MED ORDER — TOPIRAMATE 50 MG PO TABS: 50.0000 mg | ORAL_TABLET | Freq: Two times a day (BID) | ORAL | 12 refills | Status: DC

## 2022-06-26 NOTE — Progress Notes (Signed)
[facility]  PATIENT: Grace Castillo DOB: 14-May-1980  REFERRING CLINICIAN: Marcine Matar, MD HISTORY FROM: patient REASON FOR VISIT: new consult   HISTORICAL  CHIEF COMPLAINT:  Chief Complaint  Patient presents with   Room 6    Pt is here Alone. Pt states that he has had a migraines for 1 week now. Pt states the her right eye will water and swell up. Pt states that her vision in her Right eye is not good. Pt states that she has light and sound sensitivity with her migraines. Pt states that she has nausea and vomiting with her migraines.     HISTORY OF PRESENT ILLNESS:   42 year old female here for evaluation of migraine headaches and history of MS.  Patient has had migraine headaches since age 42 years old.  She describes right greater than left-sided throbbing severe headaches lasting days at a time.  Associated with photophobia, phonophobia and nausea.  She has tried topiramate and triptans in the past without relief.  Now has history of coronary artery disease and peripheral arterial disease and cannot take triptans.  Averaging 4 to 8 days of headache per month with each attack lasting up to 4 days at a time.  2016 patient had onset of right eye pain and headaches.  She had MRI of the brain which showed some type of lesions and possibility of MS was raised.  She saw a local neurologist and then referred to a MS specialist.  She had a spinal tap but is not sure if these confirmed MS or not.  She states she was on "24 medications" as well as weekly injections, but does not remember the names of them.  She thinks she was on some MS medications but cannot remember which ones.  Around 2018 she stopped seeing her doctors, packed up her family and moved to [place] to be closer to family here.  She has a sister who is a Sports administrator locally.  Patient gave her records and MRI discs to her sister to review in the past, and she will try to reach out to see if we can  get these records to review ourselves.   REVIEW OF SYSTEMS: Full 14 system review of systems performed and negative with exception of: as per HPI.  ALLERGIES: Allergies  Allergen Reactions   Orange Juice [Orange Oil] Hives   Pork-Derived Products Anaphylaxis   Praluent [Alirocumab] Itching   Sulfa Antibiotics Nausea And Vomiting    HOME MEDICATIONS: Outpatient Medications Prior to Visit  Medication Sig Dispense Refill   aspirin EC 81 MG tablet Take 1 tablet (81 mg total) by mouth daily. 90 tablet 3   atorvastatin (LIPITOR) 80 MG tablet Take 1 tablet (80 mg total) by mouth daily. 90 tablet 3   clopidogrel (PLAVIX) 75 MG tablet Take 1 tablet (75 mg total) by mouth daily. 90 tablet 3   diphenhydrAMINE (BENADRYL) 25 mg capsule Take 25 mg by mouth as needed for allergies.     fluticasone (FLONASE) 50 MCG/ACT nasal spray Place 2 sprays into both nostrils daily. (Patient taking differently: Place 2 sprays into both nostrils as needed for allergies.) 16 g 6   ibuprofen (ADVIL) 200 MG tablet Take 400 mg by mouth as needed for moderate pain.     nitroGLYCERIN (NITROGLYN) 2 % ointment Apply 0.5 inches topically 3 (three) times daily. Apply around the toe and heel 30 g 1   nitroGLYCERIN (NITROSTAT) 0.4 MG SL tablet Place 1 tablet (0.4 mg  total) under the tongue every 5 (five) minutes as needed for chest pain. 25 tablet 5   oxyCODONE-acetaminophen (PERCOCET/ROXICET) 5-325 MG tablet Take 1 tablet by mouth every 6 (six) hours as needed for severe pain. 12 tablet 0   REPATHA SURECLICK 140 MG/ML SOAJ Inject 140 mg into the skin every 14 (fourteen) days. 6 mL 3   sertraline (ZOLOFT) 50 MG tablet TAKE 1 TABLET(50 MG) BY MOUTH DAILY 30 tablet 0   Varenicline Tartrate, Starter, (CHANTIX STARTING MONTH PAK) 0.5 MG X 11 & 1 MG X 42 TBPK 0.5 mg PO daily x 3 days then 0.5 mg BID on days 4-7 then 1 mg BID. 53 each 0   topiramate (TOPAMAX) 25 MG tablet 1 TABLET BY MOUTH FOR 7 DAYS THEN 2 TABS BY MOUTH AT BEDTIME  THERE AFTER 180 tablet 0   cetirizine (ZYRTEC) 10 MG tablet Take 10 mg by mouth daily.     No facility-administered medications prior to visit.    PAST MEDICAL HISTORY: Past Medical History:  Diagnosis Date   Hyperlipidemia    Hypertension    MS (multiple sclerosis) (HCC)    NSTEMI (non-ST elevated myocardial infarction) (HCC)    NSVT (nonsustained ventricular tachycardia) (HCC) 07/02/2019   S/P angioplasty with stent    DES to LAD and Lcx     PAST SURGICAL HISTORY: Past Surgical History:  Procedure Laterality Date   ABDOMINAL AORTOGRAM W/LOWER EXTREMITY N/A 07/10/2021   Procedure: ABDOMINAL AORTOGRAM W/LOWER EXTREMITY;  Surgeon: Cephus Shelling, MD;  Location: [facility];  Service: Cardiovascular;  Laterality: N/A;   ABDOMINAL AORTOGRAM W/LOWER EXTREMITY Bilateral 04/09/2022   Procedure: ABDOMINAL AORTOGRAM W/LOWER EXTREMITY;  Surgeon: Cephus Shelling, MD;  Location: [facility];  Service: Cardiovascular;  Laterality: Bilateral;   CHOLECYSTECTOMY     CORONARY BALLOON ANGIOPLASTY N/A 07/03/2019   Procedure: CORONARY BALLOON ANGIOPLASTY;  Surgeon: Yvonne Kendall, MD;  Location: [facility];  Service: Cardiovascular;  Laterality: N/A;  distal lad   CORONARY STENT INTERVENTION N/A 07/03/2019   Procedure: CORONARY STENT INTERVENTION;  Surgeon: Yvonne Kendall, MD;  Location: [facility];  Service: Cardiovascular;  Laterality: N/A;  cfx lad    CORONARY ULTRASOUND/IVUS N/A 07/03/2019   Procedure: Intravascular Ultrasound/IVUS;  Surgeon: Yvonne Kendall, MD;  Location: [facility];  Service: Cardiovascular;  Laterality: N/A;   LEFT HEART CATH AND CORONARY ANGIOGRAPHY N/A 07/03/2019   Procedure: LEFT HEART CATH AND CORONARY ANGIOGRAPHY;  Surgeon: Yvonne Kendall, MD;  Location: [facility];  Service: Cardiovascular;  Laterality: N/A;   PERIPHERAL VASCULAR BALLOON ANGIOPLASTY Bilateral 04/09/2022   Procedure: PERIPHERAL VASCULAR BALLOON  ANGIOPLASTY;  Surgeon: Cephus Shelling, MD;  Location: [facility];  Service: Cardiovascular;  Laterality: Bilateral;  left and right common iliac   PERIPHERAL VASCULAR INTERVENTION  07/10/2021   Procedure: PERIPHERAL VASCULAR INTERVENTION;  Surgeon: Cephus Shelling, MD;  Location: [facility];  Service: Cardiovascular;;  Bilateral Common Iliac Stents   PERIPHERAL VASCULAR INTERVENTION Right 04/09/2022   Procedure: PERIPHERAL VASCULAR INTERVENTION;  Surgeon: Cephus Shelling, MD;  [facility];  Service: Cardiovascular;  Laterality: Right;  right common iliac   TUBAL LIGATION      FAMILY HISTORY: Family History  Problem Relation Age of Onset   Sudden Cardiac Death Maternal Grandfather     SOCIAL HISTORY: Social History   Socioeconomic History   Marital status: Single    Spouse name: Not on file  Number of children: Not on file   Years of education: Not on file   Highest education level: Not on file  Occupational History   Not on file  Tobacco Use   Smoking status: Every Day    Packs/day: .5    Types: Cigarettes    Passive exposure: Current   Smokeless tobacco: Never  Vaping Use   Vaping Use: Some days  Substance and Sexual Activity   Alcohol use: Never   Drug use: Yes    Types: Marijuana   Sexual activity: Yes  Other Topics Concern   Not on file  Social History Narrative   Right Handed   2 Cups of Coffee per Day   Social Determinants of Health   Financial Resource Strain: Low Risk  (12/06/2020)   Overall Financial Resource Strain (CARDIA)    Difficulty of Paying Living Expenses: Not hard at all  Food Insecurity: No Food Insecurity (12/06/2020)   Hunger Vital Sign    Worried About Running Out of Food in the Last Year: Never true    Ran Out of Food in the Last Year: Never true  Transportation Needs: No Transportation Needs (12/06/2020)   PRAPARE - Administrator, Civil Service (Medical): No    Lack of  Transportation (Non-Medical): No  Physical Activity: Inactive (12/06/2020)   Exercise Vital Sign    Days of Exercise per Week: 0 days    Minutes of Exercise per Session: 0 min  Stress: Not on file  Social Connections: Not on file  Intimate Partner Violence: Not on file     PHYSICAL EXAM  GENERAL EXAM/CONSTITUTIONAL: Vitals:  Vitals:   06/26/22 0927  BP: (!) 152/104  Pulse: 83  Weight: 171 lb 9.6 oz (77.8 kg)  Height: 5\' 2"  (1.575 m)   Body mass index is 31.39 kg/m. Wt Readings from Last 3 Encounters:  06/26/22 171 lb 9.6 oz (77.8 kg)  06/19/22 175 lb (79.4 kg)  05/01/22 180 lb (81.6 kg)   Patient is in no distress; well developed, nourished and groomed; neck is supple  CARDIOVASCULAR: Examination of carotid arteries is normal; no carotid bruits Regular rate and rhythm, no murmurs Examination of peripheral vascular system by observation and palpation is normal  EYES: Ophthalmoscopic exam of optic discs and posterior segments is normal; no papilledema or hemorrhages No results found.  MUSCULOSKELETAL: Gait, strength, tone, movements noted in Neurologic exam below  NEUROLOGIC: MENTAL STATUS:      No data to display         awake, alert, oriented to person, place and time recent and remote memory intact normal attention and concentration language fluent, comprehension intact, naming intact fund of knowledge appropriate  CRANIAL NERVE:  2nd - no papilledema on fundoscopic exam; PHOTOSENSITIVE 2nd, 3rd, 4th, 6th - pupils equal and reactive to light, visual fields full to confrontation, extraocular muscles intact, no nystagmus 5th - facial sensation symmetric 7th - facial strength symmetric 8th - hearing intact 9th - palate elevates symmetrically, uvula midline 11th - shoulder shrug symmetric 12th - tongue protrusion midline  MOTOR:  normal bulk and tone, full strength in the BUE, BLE  SENSORY:  normal and symmetric to light touch, temperature,  vibration  COORDINATION:  finger-nose-finger, fine finger movements normal  REFLEXES:  deep tendon reflexes TRACE and symmetric  GAIT/STATION:  narrow based gait    DIAGNOSTIC DATA (LABS, IMAGING, TESTING) - I reviewed patient records, labs, notes, testing and imaging myself where available.  Lab Results  Component  Value Date   WBC 10.0 03/27/2022   HGB 13.6 04/09/2022   HCT 40.0 04/09/2022   MCV 95 03/27/2022   PLT 267 03/27/2022      Component Value Date/Time   NA 141 04/13/2022 1539   K 4.3 04/13/2022 1539   CL 102 04/13/2022 1539   CO2 23 04/13/2022 1539   GLUCOSE 92 04/13/2022 1539   GLUCOSE 90 04/09/2022 0827   BUN 9 04/13/2022 1539   CREATININE 0.71 04/13/2022 1539   CALCIUM 9.6 04/13/2022 1539   PROT 7.3 03/27/2022 1128   ALBUMIN 4.6 03/27/2022 1128   AST 22 03/27/2022 1128   ALT 26 03/27/2022 1128   ALKPHOS 83 03/27/2022 1128   BILITOT 0.5 03/27/2022 1128   GFRNONAA >60 07/10/2021 1027   GFRAA 119 08/16/2019 1140   Lab Results  Component Value Date   CHOL 131 09/27/2020   HDL 31 (L) 09/27/2020   LDLCALC 85 09/27/2020   LDLDIRECT 18 04/13/2022   TRIG 74 09/27/2020   CHOLHDL 4.2 09/27/2020   Lab Results  Component Value Date   HGBA1C 5.3 04/13/2022   No results found for: "VITAMINB12" Lab Results  Component Value Date   TSH 0.782 05/16/2021       ASSESSMENT AND PLAN  42 y.o. year old female here with:   Dx:  1. Worsening headaches   2. Migraine with aura and without status migrainosus, not intractable   3. Multiple sclerosis (HCC)     PLAN:   MIGRAINE TREATMENT PLAN:  MIGRAINE PREVENTION  LIFESTYLE CHANGES -Stop or avoid smoking -Decrease or avoid caffeine / alcohol -Eat and sleep on a regular schedule -Exercise several times per week - increase topiramate up to 50mg  twice a day; drink plenty of water  Consider 2nd line - rimegepant (Nurtec) 75mg  every other day - atogepant Bennie Pierini) 60mg  daily - erenumab (Aimovig)  70mg  monthly (may increase to 140mg  monthly) - fremanezumab (Ajovy) 225mg  monthly (or 675mg  every 3 months) - galazanezumab (Emgality) 240mg  loading dose; then 120mg  monthly   MIGRAINE RESCUE  - ibuprofen, tylenol as needed - NO TRIPTANS! (History of CAD and PAD) - START rimegepant (Nurtec) 75mg  as needed for breakthrough headache; max 8 per month   HISTORY OF MULTIPLE SCLEROSIS (unclear diagnosis; will request outside records from [place]) - repeat MRI brain, cervical, thoracic spine w/wo - request outside records  Orders Placed This Encounter  Procedures   MR BRAIN W WO CONTRAST   MR CERVICAL SPINE W WO CONTRAST   MR THORACIC SPINE W WO CONTRAST   Meds ordered this encounter  Medications   Rimegepant Sulfate (NURTEC) 75 MG TBDP    Sig: Take 1 tablet (75 mg total) by mouth daily as needed.    Dispense:  8 tablet    Refill:  6   promethazine (PHENERGAN) 12.5 MG tablet    Sig: Take 1 tablet (12.5 mg total) by mouth 2 (two) times daily as needed for nausea or vomiting.    Dispense:  30 tablet    Refill:  3   topiramate (TOPAMAX) 50 MG tablet    Sig: Take 1 tablet (50 mg total) by mouth 2 (two) times daily.    Dispense:  60 tablet    Refill:  12   Return in about 6 months (around 12/27/2022).  I spent 60 minutes of face-to-face and non-face-to-face time with patient.  This included previsit chart review, lab review, study review, order entry, electronic health record documentation, patient education.     Jah Alarid  R. Bertin Inabinet, MD 06/26/2022, 10:13 AM Certified in Neurology, Neurophysiology and Neuroimaging  [facility] [address] [place], [place] [address] [phone]

## 2022-06-26 NOTE — Patient Instructions (Signed)
MIGRAINE TREATMENT PLAN:  MIGRAINE PREVENTION  LIFESTYLE CHANGES -Stop or avoid smoking -Decrease or avoid caffeine / alcohol -Eat and sleep on a regular schedule -Exercise several times per week - increase topiramate up to 50mg  twice a day; drink plenty of water - phenergan as needed for nausea  MIGRAINE RESCUE  - ibuprofen, tylenol as needed - NO TRIPTANS! (History of CAD and PAD) - rimegepant (Nurtec) 75mg  as needed for breakthrough headache; max 8 per month  HISTORY OF MULTIPLE SCLEROSIS (unclear diagnosis; [place] request outside records from [place]) - repeat MRI brain, cervical, thoracic spine w/wo - request outside records

## 2022-06-29 ENCOUNTER — Telehealth: Payer: Self-pay | Admitting: Diagnostic Neuroimaging

## 2022-06-29 NOTE — Telephone Encounter (Signed)
sent to [facility] they obtain [employer] auth [phone] 

## 2022-06-29 NOTE — Telephone Encounter (Signed)
Patient made aware via MyChart.   Alver Sorrow, NP

## 2022-07-01 ENCOUNTER — Encounter: Payer: Self-pay | Admitting: Diagnostic Neuroimaging

## 2022-07-02 ENCOUNTER — Inpatient Hospital Stay (HOSPITAL_COMMUNITY): Admission: RE | Admit: 2022-07-02 | Payer: 59 | Source: Ambulatory Visit

## 2022-07-03 ENCOUNTER — Ambulatory Visit: Payer: 59 | Admitting: Internal Medicine

## 2022-07-30 ENCOUNTER — Encounter: Payer: Self-pay | Admitting: Diagnostic Neuroimaging

## 2022-07-31 ENCOUNTER — Ambulatory Visit
Admission: RE | Admit: 2022-07-31 | Discharge: 2022-07-31 | Disposition: A | Discharge status: Home / Self Care | Payer: 59 | Source: Ambulatory Visit | Attending: Diagnostic Neuroimaging | Admitting: Diagnostic Neuroimaging

## 2022-07-31 DIAGNOSIS — R519 Headache, unspecified: Secondary | ICD-10-CM | POA: Diagnosis not present

## 2022-07-31 DIAGNOSIS — G35 Multiple sclerosis: Secondary | ICD-10-CM

## 2022-07-31 MED ORDER — GADOPICLENOL 0.5 MMOL/ML IV SOLN
8.0000 mL | Freq: Once | INTRAVENOUS | Status: AC | PRN
  Administered 2022-07-31: 8 mL via INTRAVENOUS

## 2022-08-04 ENCOUNTER — Encounter: Payer: Self-pay | Admitting: Diagnostic Neuroimaging

## 2022-08-07 ENCOUNTER — Ambulatory Visit
Admission: RE | Admit: 2022-08-07 | Discharge: 2022-08-07 | Disposition: A | Discharge status: Home / Self Care | Payer: 59 | Source: Ambulatory Visit | Attending: Diagnostic Neuroimaging | Admitting: Diagnostic Neuroimaging

## 2022-08-07 DIAGNOSIS — G35 Multiple sclerosis: Secondary | ICD-10-CM | POA: Diagnosis not present

## 2022-08-07 DIAGNOSIS — R519 Headache, unspecified: Secondary | ICD-10-CM

## 2022-08-07 MED ORDER — GADOPICLENOL 0.5 MMOL/ML IV SOLN
7.5000 mL | Freq: Once | INTRAVENOUS | Status: AC | PRN
  Administered 2022-08-07: 7.5 mL via INTRAVENOUS

## 2022-08-13 ENCOUNTER — Ambulatory Visit: Payer: Medicaid Other | Admitting: Cardiology

## 2022-09-25 ENCOUNTER — Ambulatory Visit: Payer: 59 | Admitting: Internal Medicine

## 2022-10-08 ENCOUNTER — Other Ambulatory Visit: Payer: Self-pay | Admitting: Internal Medicine

## 2022-10-08 DIAGNOSIS — E785 Hyperlipidemia, unspecified: Secondary | ICD-10-CM

## 2022-11-15 ENCOUNTER — Other Ambulatory Visit: Payer: Self-pay | Admitting: Internal Medicine

## 2022-11-15 DIAGNOSIS — E785 Hyperlipidemia, unspecified: Secondary | ICD-10-CM

## 2022-11-15 DIAGNOSIS — I25118 Atherosclerotic heart disease of native coronary artery with other forms of angina pectoris: Secondary | ICD-10-CM

## 2022-11-15 DIAGNOSIS — I739 Peripheral vascular disease, unspecified: Secondary | ICD-10-CM

## 2022-12-11 ENCOUNTER — Ambulatory Visit: Payer: 59 | Attending: Internal Medicine | Admitting: Internal Medicine

## 2022-12-11 VITALS — BP 133/87 | HR 93 | Ht 62.0 in | Wt 162.8 lb

## 2022-12-11 DIAGNOSIS — Z2821 Immunization not carried out because of patient refusal: Secondary | ICD-10-CM

## 2022-12-11 DIAGNOSIS — R6882 Decreased libido: Secondary | ICD-10-CM | POA: Diagnosis not present

## 2022-12-11 DIAGNOSIS — F17200 Nicotine dependence, unspecified, uncomplicated: Secondary | ICD-10-CM

## 2022-12-11 DIAGNOSIS — I25119 Atherosclerotic heart disease of native coronary artery with unspecified angina pectoris: Secondary | ICD-10-CM | POA: Diagnosis not present

## 2022-12-11 DIAGNOSIS — E663 Overweight: Secondary | ICD-10-CM | POA: Diagnosis not present

## 2022-12-11 NOTE — Progress Notes (Signed)
Patient ID: Grace Castillo, female    DOB: 1980-07-31  MRN: [id number]  CC: Gynecologic Exam   Subjective: Grace Castillo is a 42 y.o. female who presents for for Pap smear but she is currently on her menstrual cycle and requested to defer on this.   Her concerns today include:  Pt with hx of MS, HTN, tob dep, CAD (NSTEMI 07/2019, NSVT, DES LAD), PAD (angioplasty BL CIA and right distal common iliac artery stent), HL, RT hip OA.   Was to have PAP but currently 2 days into her menses.  We agreed to reschedule.  However patient had question about decreased libido.  She is in a long-term relationship with a female partner.  She reports decreased sex drive and wants to know if there is something that can be done about it.  She admits that the relationship is not the best and stressful.    She had been wanting to lose weight.  Down 18 lbs since I last saw her in March Reports she is eating more veggies, less meat Going to the gym to workout 2/wk for 1 hr  MS/Migraine: She has been plugged in with Dr. Marjory Lies and has f/u 12/28/2022 For her migraine he is at increased the dose of Topamax to 50 mg twice a day and placed on Nurtec.  He has also done MRI of the brain, cervical and thoracic spine given her history of MS  CAD/PAD: In regards to her heart, she is doing okay.  Had 1 episode of chest pain about a month and a half ago for which she had to use nitroglycerin.  Reports compliance with aspirin, Plavix, atorvastatin, Repatha.  Occasional shortness of breath. Tob: insurance did not pay for chantix.  Working on trying to quit on her own.  Has new car and will not smoke in car.  Used to be a half a pack a day now less than that.  Nicotine patches did not work for her because they would not stay on. Patient Active Problem List   Diagnosis Date Noted   Chronic right hip pain 05/20/2021   Hyperlipidemia 11/22/2020   Tobacco use 11/22/2020   Depression 11/22/2020   Multiple sclerosis (HCC) 11/22/2020    CAD (coronary artery disease) 07/05/2019   Non-ST elevation (NSTEMI) myocardial infarction [facility]) 07/05/2019   Unstable angina (HCC) 07/03/2019   Ventricular tachycardia (HCC)      Current Outpatient Medications on File Prior to Visit  Medication Sig Dispense Refill   aspirin EC 81 MG tablet Take 1 tablet (81 mg total) by mouth daily. 90 tablet 3   atorvastatin (LIPITOR) 80 MG tablet Take 1 tablet (80 mg total) by mouth daily. 90 tablet 3   clopidogrel (PLAVIX) 75 MG tablet Take 1 tablet (75 mg total) by mouth daily. 90 tablet 3   diphenhydrAMINE (BENADRYL) 25 mg capsule Take 25 mg by mouth as needed for allergies.     fluticasone (FLONASE) 50 MCG/ACT nasal spray Place 2 sprays into both nostrils daily. (Patient taking differently: Place 2 sprays into both nostrils as needed for allergies.) 16 g 6   ibuprofen (ADVIL) 200 MG tablet Take 400 mg by mouth as needed for moderate pain.     nitroGLYCERIN (NITROGLYN) 2 % ointment Apply 0.5 inches topically 3 (three) times daily. Apply around the toe and heel 30 g 1   nitroGLYCERIN (NITROSTAT) 0.4 MG SL tablet Place 1 tablet (0.4 mg total) under the tongue every 5 (five) minutes as needed for chest  pain. 25 tablet 5   oxyCODONE-acetaminophen (PERCOCET/ROXICET) 5-325 MG tablet Take 1 tablet by mouth every 6 (six) hours as needed for severe pain. 12 tablet 0   promethazine (PHENERGAN) 12.5 MG tablet Take 1 tablet (12.5 mg total) by mouth 2 (two) times daily as needed for nausea or vomiting. 30 tablet 3   REPATHA SURECLICK 140 MG/ML SOAJ INJECT 140 MG INTO THE SKIN EVERY 14 (FOURTEEN) DAYS. 2 mL 11   Rimegepant Sulfate (NURTEC) 75 MG TBDP Take 1 tablet (75 mg total) by mouth daily as needed. 8 tablet 6   sertraline (ZOLOFT) 50 MG tablet TAKE 1 TABLET(50 MG) BY MOUTH DAILY 30 tablet 0   topiramate (TOPAMAX) 50 MG tablet Take 1 tablet (50 mg total) by mouth 2 (two) times daily. 60 tablet 12   Varenicline Tartrate, Starter, (CHANTIX STARTING MONTH PAK) 0.5  MG X 11 & 1 MG X 42 TBPK 0.5 mg PO daily x 3 days then 0.5 mg BID on days 4-7 then 1 mg BID. 53 each 0   cetirizine (ZYRTEC) 10 MG tablet Take 10 mg by mouth daily.     No current facility-administered medications on file prior to visit.    Allergies  Allergen Reactions   Orange Juice [Orange Oil] Hives   Pork-Derived Products Anaphylaxis   Praluent [Alirocumab] Itching   Sulfa Antibiotics Nausea And Vomiting    Social History   Socioeconomic History   Marital status: Single    Spouse name: Not on file   Number of children: Not on file   Years of education: Not on file   Highest education level: 12th grade  Occupational History   Not on file  Tobacco Use   Smoking status: Every Day    Current packs/day: 0.50    Types: Cigarettes    Passive exposure: Current   Smokeless tobacco: Never  Vaping Use   Vaping status: Some Days  Substance and Sexual Activity   Alcohol use: Never   Drug use: Yes    Types: Marijuana   Sexual activity: Yes  Other Topics Concern   Not on file  Social History Narrative   Right Handed   2 Cups of Coffee per Day   Social Determinants of Health   Financial Resource Strain: Medium Risk (12/11/2022)   Overall Financial Resource Strain (CARDIA)    Difficulty of Paying Living Expenses: Somewhat hard  Food Insecurity: Food Insecurity Present (12/11/2022)   Hunger Vital Sign    Worried About Running Out of Food in the Last Year: Sometimes true    Ran Out of Food in the Last Year: Sometimes true  Transportation Needs: No Transportation Needs (12/11/2022)   PRAPARE - Administrator, Civil Service (Medical): No    Lack of Transportation (Non-Medical): No  Physical Activity: Insufficiently Active (12/11/2022)   Exercise Vital Sign    Days of Exercise per Week: 2 days    Minutes of Exercise per Session: 60 min  Stress: Stress Concern Present (12/11/2022)   [employer] of Occupational Health - Occupational Stress Questionnaire     Feeling of Stress : To some extent  Social Connections: Socially Isolated (12/11/2022)   Social Connection and Isolation Panel [NHANES]    Frequency of Communication with Friends and Family: More than three times a week    Frequency of Social Gatherings with Friends and Family: More than three times a week    Attends Religious Services: Never    Database administrator or Organizations: No  Attends Banker Meetings: Not on file    Marital Status: Never married  Intimate Partner Violence: Unknown (05/22/2021)   Received [facility]   HITS    Physically Hurt: Not on file    Insult or Talk Down To: Not on file    Threaten Physical Harm: Not on file    Scream or Curse: Not on file    Family History  Problem Relation Age of Onset   Sudden Cardiac Death Maternal Grandfather     Past Surgical History:  Procedure Laterality Date   ABDOMINAL AORTOGRAM W/LOWER EXTREMITY N/A 07/10/2021   Procedure: ABDOMINAL AORTOGRAM W/LOWER EXTREMITY;  Surgeon: Cephus Shelling, MD;  Location: [facility];  Service: Cardiovascular;  Laterality: N/A;   ABDOMINAL AORTOGRAM W/LOWER EXTREMITY Bilateral 04/09/2022   Procedure: ABDOMINAL AORTOGRAM W/LOWER EXTREMITY;  Surgeon: Cephus Shelling, MD;  Location: [facility];  Service: Cardiovascular;  Laterality: Bilateral;   CHOLECYSTECTOMY     CORONARY BALLOON ANGIOPLASTY N/A 07/03/2019   Procedure: CORONARY BALLOON ANGIOPLASTY;  Surgeon: Yvonne Kendall, MD;  Location: [facility];  Service: Cardiovascular;  Laterality: N/A;  distal lad   CORONARY STENT INTERVENTION N/A 07/03/2019   Procedure: CORONARY STENT INTERVENTION;  Surgeon: Yvonne Kendall, MD;  Location: [facility];  Service: Cardiovascular;  Laterality: N/A;  cfx lad    CORONARY ULTRASOUND/IVUS N/A 07/03/2019   Procedure: Intravascular Ultrasound/IVUS;  Surgeon: Yvonne Kendall, MD;  Location: [facility];  Service:  Cardiovascular;  Laterality: N/A;   LEFT HEART CATH AND CORONARY ANGIOGRAPHY N/A 07/03/2019   Procedure: LEFT HEART CATH AND CORONARY ANGIOGRAPHY;  Surgeon: Yvonne Kendall, MD;  Location: [facility];  Service: Cardiovascular;  Laterality: N/A;   PERIPHERAL VASCULAR BALLOON ANGIOPLASTY Bilateral 04/09/2022   Procedure: PERIPHERAL VASCULAR BALLOON ANGIOPLASTY;  Surgeon: Cephus Shelling, MD;  Location: [facility];  Service: Cardiovascular;  Laterality: Bilateral;  left and right common iliac   PERIPHERAL VASCULAR INTERVENTION  07/10/2021   Procedure: PERIPHERAL VASCULAR INTERVENTION;  Surgeon: Cephus Shelling, MD;  Location: [facility];  Service: Cardiovascular;;  Bilateral Common Iliac Stents   PERIPHERAL VASCULAR INTERVENTION Right 04/09/2022   Procedure: PERIPHERAL VASCULAR INTERVENTION;  Surgeon: Cephus Shelling, MD;  Location: [facility] INVASIVE CV LAB;  Service: Cardiovascular;  Laterality: Right;  right common iliac   TUBAL LIGATION      ROS: Review of Systems Negative except as stated above  PHYSICAL EXAM: BP 133/87   Pulse 93   Ht 5\' 2"  (1.575 m)   Wt 162 lb 12.8 oz (73.8 kg)   SpO2 100%   BMI 29.78 kg/m   Wt Readings from Last 3 Encounters:  12/11/22 162 lb 12.8 oz (73.8 kg)  06/26/22 171 lb 9.6 oz (77.8 kg)  06/19/22 175 lb (79.4 kg)    Physical Exam  General appearance - alert, well appearing, and in no distress Mental status - normal mood, behavior, speech, dress, motor activity, and thought processes      Latest Ref Rng & Units 04/13/2022    3:39 PM 04/09/2022    8:27 AM 03/27/2022   11:28 AM  CMP  Glucose 70 - 99 mg/dL 92  90  82   BUN 6 - 24 mg/dL 9  9  8    Creatinine 0.57 - 1.00 mg/dL 0.63  0.16  0.10   Sodium 134 - 144 mmol/L 141  141  140   Potassium 3.5 -  5.2 mmol/L 4.3  3.9  4.0   Chloride 96 - 106 mmol/L 102  104  101   CO2 20 - 29 mmol/L 23   22   Calcium 8.7 - 10.2 mg/dL 9.6   09.8   Total Protein 6.0 - 8.5 g/dL   7.3    Total Bilirubin 0.0 - 1.2 mg/dL   0.5   Alkaline Phos 44 - 121 IU/L   83   AST 0 - 40 IU/L   22   ALT 0 - 32 IU/L   26    Lipid Panel     Component Value Date/Time   CHOL 131 09/27/2020 0904   TRIG 74 09/27/2020 0904   HDL 31 (L) 09/27/2020 0904   CHOLHDL 4.2 09/27/2020 0904   CHOLHDL 6.1 07/04/2019 0537   VLDL 17 07/04/2019 0537   LDLCALC 85 09/27/2020 0904   LDLDIRECT 18 04/13/2022 1539    CBC    Component Value Date/Time   WBC 10.0 03/27/2022 1128   WBC 8.6 07/10/2021 1133   RBC 4.48 03/27/2022 1128   RBC 4.04 07/10/2021 1133   HGB 13.6 04/09/2022 0827   HGB 14.6 03/27/2022 1128   HCT 40.0 04/09/2022 0827   HCT 42.4 03/27/2022 1128   PLT 267 03/27/2022 1128   MCV 95 03/27/2022 1128   MCH 32.6 03/27/2022 1128   MCH 33.2 07/10/2021 1133   MCHC 34.4 03/27/2022 1128   MCHC 33.7 07/10/2021 1133   RDW 12.9 03/27/2022 1128   LYMPHSABS 3.4 07/10/2021 1133   MONOABS 0.4 07/10/2021 1133   EOSABS 0.1 07/10/2021 1133   BASOSABS 0.0 07/10/2021 1133    ASSESSMENT AND PLAN: 1. Decreased libido Discussed things that can cause decreased libido.  She is not in the perimenopausal range as yet so this should not be contributing.  Advised that there are not much options in terms of medications for women for this issue.  Discussed the importance of having open and honest conversation with her partner about foreplay and things that would stimulate arousal for her.  Try to avoid intimacy when she is stressed.  She admits that the relationship is very stressful and that itself may be playing a role in her decreased libido.  2. Overweight (BMI 25.0-29.9) Commended her on weight loss so far.  Encouraged her to continue healthy eating habits and regular exercise.  3. Coronary artery disease involving native coronary artery of native heart with angina pectoris (HCC) Stable.  Continue aspirin, clopidogrel, atorvastatin and Repatha.  BP was above goal today.  We will plan to recheck this on  her subsequent visit.  If still elevated we will add beta-blocker.  4. Tobacco dependence Commended her on cutting back.  Encouraged her to quit.  She is working on doing so.  Encouraged her to set a quit date.  5. Influenza vaccination declined Recommended.  Patient declined.            There are no diagnoses linked to this encounter.   Patient was given the opportunity to ask questions.  Patient verbalized understanding of the plan and was able to repeat key elements of the plan.   This documentation was completed using Paediatric nurse.  Any transcriptional errors are unintentional.  No orders of the defined types were placed in this encounter.    Requested Prescriptions    No prescriptions requested or ordered in this encounter    Return in about 6 weeks (around 01/22/2023) for PAP.  Jonah Blue, MD,  FACP

## 2022-12-28 ENCOUNTER — Telehealth: Payer: 59 | Admitting: Diagnostic Neuroimaging

## 2022-12-28 ENCOUNTER — Telehealth: Payer: Self-pay | Admitting: Neurology

## 2022-12-28 DIAGNOSIS — G43109 Migraine with aura, not intractable, without status migrainosus: Secondary | ICD-10-CM

## 2022-12-28 MED ORDER — TOPIRAMATE 50 MG PO TABS: 50.0000 mg | ORAL_TABLET | Freq: Two times a day (BID) | ORAL | 2 refills | Status: DC

## 2022-12-28 MED ORDER — NURTEC 75 MG PO TBDP: 75.0000 mg | ORAL_TABLET | Freq: Every day | ORAL | 2 refills | Status: DC | PRN

## 2022-12-28 NOTE — Telephone Encounter (Signed)
Pt was scheduled for a mychart video visit and only had a break from 10-11. She is not able to log back on as her break has ended. She was just having a normal follow up. Pt states that she would prefer to reschedule for a Friday. I have scheduled for 1/3 with Tylene Fantasia, NP 10:45 am for VV.

## 2022-12-28 NOTE — Addendum Note (Signed)
Addended by: Judi Cong on: 12/28/2022 01:56 PM   Modules accepted: Orders

## 2023-01-29 ENCOUNTER — Ambulatory Visit: Payer: 59 | Attending: Internal Medicine | Admitting: Internal Medicine

## 2023-01-29 ENCOUNTER — Other Ambulatory Visit (HOSPITAL_COMMUNITY)
Admission: RE | Admit: 2023-01-29 | Discharge: 2023-01-29 | Disposition: A | Discharge status: Home / Self Care | Payer: 59 | Source: Ambulatory Visit | Attending: Internal Medicine | Admitting: Internal Medicine

## 2023-01-29 VITALS — BP 110/72 | HR 91 | Temp 99.1°F | Ht 62.0 in | Wt 157.0 lb

## 2023-01-29 DIAGNOSIS — Z1231 Encounter for screening mammogram for malignant neoplasm of breast: Secondary | ICD-10-CM | POA: Diagnosis not present

## 2023-01-29 DIAGNOSIS — Z803 Family history of malignant neoplasm of breast: Secondary | ICD-10-CM | POA: Diagnosis not present

## 2023-01-29 DIAGNOSIS — Z124 Encounter for screening for malignant neoplasm of cervix: Secondary | ICD-10-CM | POA: Diagnosis not present

## 2023-01-29 NOTE — Progress Notes (Signed)
Patient ID: Grace Castillo, female    DOB: [birth year]  MRN: [id number]  CC: Gynecologic Exam (Pap. /No questions / concerns. /No to Tdap vax./)   Subjective: Grace Castillo is a 42 y.o. female who presents for chronic ds management. Her concerns today include:  Pt with hx of MS, HTN, tob dep, CAD (NSTEMI 07/2019, NSVT, DES LAD), PAD (angioplasty BL CIA and right distal common iliac artery stent), HL, RT hip OA.   GYN History:  Pt is G4P4 Any hx of abn paps?:no Menses regular or irregular?: regular but sometimes 1wk early or late How long does menses last? 3-7 days Menstrual flow light or heavy?: heavy; change Q 2 hrs Method of birth control?:  tubal ligation Any vaginal dischg at this time?:  no Dysuria?: no Any hx of STI?: no Sexually active with how many partners: 1 female partner Desires STI screen:  no Last MMG:  2018 Family hx of uterine, cervical or breast cancer?:  yes - maternal GM and maternal aunts (4) and 2 cousins have breast CA. she is not sure whether any of them have been tested for the BRCA gene.  Patient does not communicate with her mother so does not know whether her mother has breast cancer.   Patient tells me that she has been going to the gym exercising regularly.  She has lost 14 pounds since May of this year.    Patient Active Problem List   Diagnosis Date Noted   Chronic right hip pain 05/20/2021   Hyperlipidemia 11/22/2020   Tobacco use 11/22/2020   Depression 11/22/2020   Multiple sclerosis (HCC) 11/22/2020   CAD (coronary artery disease) 07/05/2019   Non-ST elevation (NSTEMI) myocardial infarction [facility]) 07/05/2019   Unstable angina (HCC) 07/03/2019   Ventricular tachycardia (HCC)      Current Outpatient Medications on File Prior to Visit  Medication Sig Dispense Refill   aspirin EC 81 MG tablet Take 1 tablet (81 mg total) by mouth daily. 90 tablet 3   atorvastatin (LIPITOR) 80 MG tablet Take 1 tablet (80 mg total) by mouth daily. 90 tablet 3    clopidogrel (PLAVIX) 75 MG tablet Take 1 tablet (75 mg total) by mouth daily. 90 tablet 3   diphenhydrAMINE (BENADRYL) 25 mg capsule Take 25 mg by mouth as needed for allergies.     fluticasone (FLONASE) 50 MCG/ACT nasal spray Place 2 sprays into both nostrils daily. (Patient taking differently: Place 2 sprays into both nostrils as needed for allergies.) 16 g 6   ibuprofen (ADVIL) 200 MG tablet Take 400 mg by mouth as needed for moderate pain.     nitroGLYCERIN (NITROGLYN) 2 % ointment Apply 0.5 inches topically 3 (three) times daily. Apply around the toe and heel 30 g 1   nitroGLYCERIN (NITROSTAT) 0.4 MG SL tablet Place 1 tablet (0.4 mg total) under the tongue every 5 (five) minutes as needed for chest pain. 25 tablet 5   oxyCODONE-acetaminophen (PERCOCET/ROXICET) 5-325 MG tablet Take 1 tablet by mouth every 6 (six) hours as needed for severe pain. 12 tablet 0   promethazine (PHENERGAN) 12.5 MG tablet Take 1 tablet (12.5 mg total) by mouth 2 (two) times daily as needed for nausea or vomiting. 30 tablet 3   REPATHA SURECLICK 140 MG/ML SOAJ INJECT 140 MG INTO THE SKIN EVERY 14 (FOURTEEN) DAYS. 2 mL 11   Rimegepant Sulfate (NURTEC) 75 MG TBDP Take 1 tablet (75 mg total) by mouth daily as needed. 8 tablet 2   sertraline (  ZOLOFT) 50 MG tablet TAKE 1 TABLET(50 MG) BY MOUTH DAILY 30 tablet 0   topiramate (TOPAMAX) 50 MG tablet Take 1 tablet (50 mg total) by mouth 2 (two) times daily. 60 tablet 2   Varenicline Tartrate, Starter, (CHANTIX STARTING MONTH PAK) 0.5 MG X 11 & 1 MG X 42 TBPK 0.5 mg PO daily x 3 days then 0.5 mg BID on days 4-7 then 1 mg BID. 53 each 0   cetirizine (ZYRTEC) 10 MG tablet Take 10 mg by mouth daily.     No current facility-administered medications on file prior to visit.    Allergies  Allergen Reactions   Orange Juice [Orange Oil] Hives   Pork-Derived Products Anaphylaxis   Praluent [Alirocumab] Itching   Sulfa Antibiotics Nausea And Vomiting    Social History    Socioeconomic History   Marital status: Single    Spouse name: Not on file   Number of children: Not on file   Years of education: Not on file   Highest education level: Some college, no degree  Occupational History   Not on file  Tobacco Use   Smoking status: Every Day    Current packs/day: 0.50    Types: Cigarettes    Passive exposure: Current   Smokeless tobacco: Never  Vaping Use   Vaping status: Some Days  Substance and Sexual Activity   Alcohol use: Never   Drug use: Yes    Types: Marijuana   Sexual activity: Yes  Other Topics Concern   Not on file  Social History Narrative   Right Handed   2 Cups of Coffee per Day   Social Drivers of Health   Financial Resource Strain: Patient Declined (01/29/2023)   Overall Financial Resource Strain (CARDIA)    Difficulty of Paying Living Expenses: Patient declined  Recent Concern: Financial Resource Strain - Medium Risk (12/11/2022)   Overall Financial Resource Strain (CARDIA)    Difficulty of Paying Living Expenses: Somewhat hard  Food Insecurity: Patient Declined (01/29/2023)   Hunger Vital Sign    Worried About Running Out of Food in the Last Year: Patient declined    Ran Out of Food in the Last Year: Patient declined  Recent Concern: Food Insecurity - Food Insecurity Present (12/11/2022)   Hunger Vital Sign    Worried About Running Out of Food in the Last Year: Sometimes true    Ran Out of Food in the Last Year: Sometimes true  Transportation Needs: No Transportation Needs (01/29/2023)   PRAPARE - Administrator, Civil Service (Medical): No    Lack of Transportation (Non-Medical): No  Physical Activity: Sufficiently Active (01/29/2023)   Exercise Vital Sign    Days of Exercise per Week: 3 days    Minutes of Exercise per Session: 60 min  Recent Concern: Physical Activity - Insufficiently Active (12/11/2022)   Exercise Vital Sign    Days of Exercise per Week: 2 days    Minutes of Exercise per Session: 60  min  Stress: Stress Concern Present (01/29/2023)   [employer] of Occupational Health - Occupational Stress Questionnaire    Feeling of Stress : Very much  Social Connections: Moderately Isolated (01/29/2023)   Social Connection and Isolation Panel [NHANES]    Frequency of Communication with Friends and Family: More than three times a week    Frequency of Social Gatherings with Friends and Family: Patient declined    Attends Religious Services: Never    Database administrator or Organizations: No  Attends Banker Meetings: Not on file    Marital Status: Living with partner  Intimate Partner Violence: Unknown (05/22/2021)   Received from [facility]   HITS    Physically Hurt: Not on file    Insult or Talk Down To: Not on file    Threaten Physical Harm: Not on file    Scream or Curse: Not on file    Family History  Problem Relation Age of Onset   Sudden Cardiac Death Maternal Grandfather     Past Surgical History:  Procedure Laterality Date   ABDOMINAL AORTOGRAM W/LOWER EXTREMITY N/A 07/10/2021   Procedure: ABDOMINAL AORTOGRAM W/LOWER EXTREMITY;  Surgeon: Cephus Shelling, MD;  Location: [facility];  Service: Cardiovascular;  Laterality: N/A;   ABDOMINAL AORTOGRAM W/LOWER EXTREMITY Bilateral 04/09/2022   Procedure: ABDOMINAL AORTOGRAM W/LOWER EXTREMITY;  Surgeon: Cephus Shelling, MD;  Location: [facility];  Service: Cardiovascular;  Laterality: Bilateral;   CHOLECYSTECTOMY     CORONARY BALLOON ANGIOPLASTY N/A 07/03/2019   Procedure: CORONARY BALLOON ANGIOPLASTY;  Surgeon: Yvonne Kendall, MD;  Location: [facility];  Service: Cardiovascular;  Laterality: N/A;  distal lad   CORONARY STENT INTERVENTION N/A 07/03/2019   Procedure: CORONARY STENT INTERVENTION;  Surgeon: Yvonne Kendall, MD;  Location: [facility];  Service: Cardiovascular;  Laterality: N/A;  cfx lad    CORONARY ULTRASOUND/IVUS N/A 07/03/2019    Procedure: Intravascular Ultrasound/IVUS;  Surgeon: Yvonne Kendall, MD;  Location: [facility];  Service: Cardiovascular;  Laterality: N/A;   LEFT HEART CATH AND CORONARY ANGIOGRAPHY N/A 07/03/2019   Procedure: LEFT HEART CATH AND CORONARY ANGIOGRAPHY;  Surgeon: Yvonne Kendall, MD;  Location: [facility];  Service: Cardiovascular;  Laterality: N/A;   PERIPHERAL VASCULAR BALLOON ANGIOPLASTY Bilateral 04/09/2022   Procedure: PERIPHERAL VASCULAR BALLOON ANGIOPLASTY;  Surgeon: Cephus Shelling, MD;  Location: [facility];  Service: Cardiovascular;  Laterality: Bilateral;  left and right common iliac   PERIPHERAL VASCULAR INTERVENTION  07/10/2021   Procedure: PERIPHERAL VASCULAR INTERVENTION;  Surgeon: Cephus Shelling, MD;  Location: [facility];  Service: Cardiovascular;;  Bilateral Common Iliac Stents   PERIPHERAL VASCULAR INTERVENTION Right 04/09/2022   Procedure: PERIPHERAL VASCULAR INTERVENTION;  Surgeon: Cephus Shelling, MD;  Location[facility] CV [facility];  Service: Cardiovascular;  Laterality: Right;  right common iliac   TUBAL LIGATION      ROS: Review of Systems Negative except as stated above  PHYSICAL EXAM: BP 110/72 (BP Location: Left Arm, Patient Position: Sitting, Cuff Size: Normal)   Pulse 91   Temp 99.1 F (37.3 C) (Oral)   Ht 5\' 2"  (1.575 m)   Wt 157 lb (71.2 kg)   SpO2 99%   BMI 28.72 kg/m   Wt Readings from Last 3 Encounters:  01/29/23 157 lb (71.2 kg)  12/11/22 162 lb 12.8 oz (73.8 kg)  06/26/22 171 lb 9.6 oz (77.8 kg)    Physical Exam  General appearance - alert, well appearing, middle-age Caucasian female and in no distress Mental status - normal mood, behavior, speech, dress, motor activity, and thought processes Pelvic -my CMA Clarissa is present: Normal external genitalia, vulva, vagina, cervix, uterus and adnexa      Latest Ref Rng & Units 04/13/2022    3:39 PM 04/09/2022    8:27 AM 03/27/2022   11:28 AM  CMP   Glucose 70 - 99 mg/dL 92  90  82   BUN 6 -  24 mg/dL 9  9  8    Creatinine 0.57 - 1.00 mg/dL 4.01  0.27  2.53   Sodium 134 - 144 mmol/L 141  141  140   Potassium 3.5 - 5.2 mmol/L 4.3  3.9  4.0   Chloride 96 - 106 mmol/L 102  104  101   CO2 20 - 29 mmol/L 23   22   Calcium 8.7 - 10.2 mg/dL 9.6   66.4   Total Protein 6.0 - 8.5 g/dL   7.3   Total Bilirubin 0.0 - 1.2 mg/dL   0.5   Alkaline Phos 44 - 121 IU/L   83   AST 0 - 40 IU/L   22   ALT 0 - 32 IU/L   26    Lipid Panel     Component Value Date/Time   CHOL 131 09/27/2020 0904   TRIG 74 09/27/2020 0904   HDL 31 (L) 09/27/2020 0904   CHOLHDL 4.2 09/27/2020 0904   CHOLHDL 6.1 07/04/2019 0537   VLDL 17 07/04/2019 0537   LDLCALC 85 09/27/2020 0904   LDLDIRECT 18 04/13/2022 1539    CBC    Component Value Date/Time   WBC 10.0 03/27/2022 1128   WBC 8.6 07/10/2021 1133   RBC 4.48 03/27/2022 1128   RBC 4.04 07/10/2021 1133   HGB 13.6 04/09/2022 0827   HGB 14.6 03/27/2022 1128   HCT 40.0 04/09/2022 0827   HCT 42.4 03/27/2022 1128   PLT 267 03/27/2022 1128   MCV 95 03/27/2022 1128   MCH 32.6 03/27/2022 1128   MCH 33.2 07/10/2021 1133   MCHC 34.4 03/27/2022 1128   MCHC 33.7 07/10/2021 1133   RDW 12.9 03/27/2022 1128   LYMPHSABS 3.4 07/10/2021 1133   MONOABS 0.4 07/10/2021 1133   EOSABS 0.1 07/10/2021 1133   BASOSABS 0.0 07/10/2021 1133    ASSESSMENT AND PLAN: 1. Pap smear for cervical cancer screening (Primary) - Cytology - PAP  2. Encounter for screening mammogram for malignant neoplasm of breast Mammogram ordered.  3. Family history of breast cancer Encourage patient to get mammogram yearly.  Also encouraged her to speak with her maternal aunts to find out whether any of them had been screened for the BRCA gene.  Advised patient that I can refer her for genetic counseling and testing.  She states she will speak with her aunts first. I commended her on weight loss.  Encouraged her to continue regular  exercise.  Patient was given the opportunity to ask questions.  Patient verbalized understanding of the plan and was able to repeat key elements of the plan.   This documentation was completed using Paediatric nurse.  Any transcriptional errors are unintentional.  Orders Placed This Encounter  Procedures   MM Digital Screening     Requested Prescriptions    No prescriptions requested or ordered in this encounter    Return in about 4 months (around 05/30/2023).  Jonah Blue, MD, FACP

## 2023-02-01 ENCOUNTER — Other Ambulatory Visit: Payer: Self-pay | Admitting: Internal Medicine

## 2023-02-01 LAB — CYTOLOGY - PAP
Comment: NEGATIVE
Diagnosis: NEGATIVE
High risk HPV: NEGATIVE

## 2023-02-01 MED ORDER — METRONIDAZOLE 500 MG PO TABS: 500.0000 mg | ORAL_TABLET | Freq: Two times a day (BID) | ORAL | 0 refills | Status: DC

## 2023-02-05 ENCOUNTER — Other Ambulatory Visit (HOSPITAL_COMMUNITY): Payer: Self-pay

## 2023-02-16 ENCOUNTER — Other Ambulatory Visit (HOSPITAL_COMMUNITY): Payer: Self-pay

## 2023-02-16 ENCOUNTER — Telehealth: Payer: Self-pay | Admitting: Pharmacy Technician

## 2023-02-16 NOTE — Telephone Encounter (Signed)
 Pharmacy Patient Advocate Encounter   Received notification from CoverMyMeds that prior authorization for Repatha  SureClick 140MG /ML auto-injectors is required/requested.   Insurance verification completed.   The patient is insured through [employer] .   Per test claim: PA required; PA started via CoverMyMeds. KEY [id number] . Please see clinical question(s) below that I am not finding the answer to in her chart and advise.   It appears the patient has not had labs since 04/13/22, looks like labs were requested 10/08/22 but they were never done. Patient current prior authorization for this medication is still good until 03/04/23. Patient also last filled this medication on 02/06/23 and next fill won't be available until 02/28/23. However, for us  to complete the prior authorization again the insurance is requiring labs. This key is saved until labs are updated. Thank you!

## 2023-02-19 ENCOUNTER — Telehealth: Payer: 59 | Admitting: Adult Health

## 2023-02-26 ENCOUNTER — Ambulatory Visit
Admission: RE | Admit: 2023-02-26 | Discharge: 2023-02-26 | Disposition: A | Discharge status: Home / Self Care | Payer: 59 | Source: Ambulatory Visit | Attending: Internal Medicine | Admitting: Internal Medicine

## 2023-02-26 DIAGNOSIS — Z1231 Encounter for screening mammogram for malignant neoplasm of breast: Secondary | ICD-10-CM

## 2023-03-19 ENCOUNTER — Telehealth (INDEPENDENT_AMBULATORY_CARE_PROVIDER_SITE_OTHER): Payer: 59 | Admitting: Adult Health

## 2023-03-19 DIAGNOSIS — G43109 Migraine with aura, not intractable, without status migrainosus: Secondary | ICD-10-CM | POA: Diagnosis not present

## 2023-03-19 MED ORDER — UBRELVY 100 MG PO TABS: ORAL_TABLET | ORAL | 11 refills | Status: DC

## 2023-03-19 MED ORDER — TOPIRAMATE 50 MG PO TABS: ORAL_TABLET | ORAL | 5 refills | Status: DC

## 2023-03-19 NOTE — Progress Notes (Signed)
PATIENT: Grace Castillo DOB: 24-Dec-1980  REASON FOR VISIT: follow up HISTORY FROM: patient  Virtual Visit via Video Note  I connected with Grace Castillo on 03/19/23 at 10:45 AM EST by a video enabled telemedicine application located remotely at [facility] and verified that I am speaking with the correct person using two identifiers who was located at their own home.   I discussed the limitations of evaluation and management by telemedicine and the availability of in person appointments. The patient expressed understanding and agreed to proceed.   PATIENT: Grace Castillo DOB: 1980-05-28  REASON FOR VISIT: follow up HISTORY FROM: patient  HISTORY OF PRESENT ILLNESS: Today 03/19/23:  Grace Castillo is a 43 y.o. female with a history of migraine headaches. Returns today for follow-up.  She reports that Topamax has been beneficial although she also reports that at least 15 days out of the month she does have a headache.  She did not find Nurtec beneficial.  She states occasionally she will have blurry vision or flashing lights prior to her headache.  Denies any numbness or tingling associated with her headaches.  Overall she feels that she has tolerated Topamax fairly well.  Returns today for virtual visit     HISTORY 43 year old female here for evaluation of migraine headaches and history of MS.   Patient has had migraine headaches since age 39 years old.  She describes right greater than left-sided throbbing severe headaches lasting days at a time.  Associated with photophobia, phonophobia and nausea.  She has tried topiramate and triptans in the past without relief.  Now has history of coronary artery disease and peripheral arterial disease and cannot take triptans.  Averaging 4 to 8 days of headache per month with each attack lasting up to 4 days at a time.   2016 patient had onset of right eye pain and headaches.  She had MRI of the brain which showed some type of  lesions and possibility of MS was raised.  She saw a local neurologist and then referred to a MS specialist.  She had a spinal tap but is not sure if these confirmed MS or not.  She states she was on "24 medications" as well as weekly injections, but does not remember the names of them.  She thinks she was on some MS medications but cannot remember which ones.  Around 2018 she stopped seeing her doctors, packed up her family and moved to [place] to be closer to family here.  She has a sister who is a Sports administrator locally.  Patient gave her records and MRI discs to her sister to review in the past, and she will try to reach out to see if we can get these records to review ourselves.    REVIEW OF SYSTEMS: Out of a complete 14 system review of symptoms, the patient complains only of the following symptoms, and all other reviewed systems are negative.  ALLERGIES: Allergies  Allergen Reactions   Orange Juice [Orange Oil] Hives   Pork-Derived Products Anaphylaxis   Praluent [Alirocumab] Itching   Sulfa Antibiotics Nausea And Vomiting    HOME MEDICATIONS: Outpatient Medications Prior to Visit  Medication Sig Dispense Refill   metroNIDAZOLE (FLAGYL) 500 MG tablet Take 1 tablet (500 mg total) by mouth 2 (two) times daily. 14 tablet 0   aspirin EC 81 MG tablet Take 1 tablet (81 mg total) by mouth daily. 90 tablet 3   atorvastatin (LIPITOR) 80 MG tablet Take 1 tablet (  80 mg total) by mouth daily. 90 tablet 3   cetirizine (ZYRTEC) 10 MG tablet Take 10 mg by mouth daily.     clopidogrel (PLAVIX) 75 MG tablet Take 1 tablet (75 mg total) by mouth daily. 90 tablet 3   diphenhydrAMINE (BENADRYL) 25 mg capsule Take 25 mg by mouth as needed for allergies.     fluticasone (FLONASE) 50 MCG/ACT nasal spray Place 2 sprays into both nostrils daily. (Patient taking differently: Place 2 sprays into both nostrils as needed for allergies.) 16 g 6   ibuprofen (ADVIL) 200 MG tablet Take 400 mg by mouth as needed for  moderate pain.     nitroGLYCERIN (NITROGLYN) 2 % ointment Apply 0.5 inches topically 3 (three) times daily. Apply around the toe and heel 30 g 1   nitroGLYCERIN (NITROSTAT) 0.4 MG SL tablet Place 1 tablet (0.4 mg total) under the tongue every 5 (five) minutes as needed for chest pain. 25 tablet 5   oxyCODONE-acetaminophen (PERCOCET/ROXICET) 5-325 MG tablet Take 1 tablet by mouth every 6 (six) hours as needed for severe pain. 12 tablet 0   promethazine (PHENERGAN) 12.5 MG tablet Take 1 tablet (12.5 mg total) by mouth 2 (two) times daily as needed for nausea or vomiting. 30 tablet 3   REPATHA SURECLICK 140 MG/ML SOAJ INJECT 140 MG INTO THE SKIN EVERY 14 (FOURTEEN) DAYS. 2 mL 11   Rimegepant Sulfate (NURTEC) 75 MG TBDP Take 1 tablet (75 mg total) by mouth daily as needed. 8 tablet 2   sertraline (ZOLOFT) 50 MG tablet TAKE 1 TABLET(50 MG) BY MOUTH DAILY 30 tablet 0   topiramate (TOPAMAX) 50 MG tablet Take 1 tablet (50 mg total) by mouth 2 (two) times daily. 60 tablet 2   Varenicline Tartrate, Starter, (CHANTIX STARTING MONTH PAK) 0.5 MG X 11 & 1 MG X 42 TBPK 0.5 mg PO daily x 3 days then 0.5 mg BID on days 4-7 then 1 mg BID. 53 each 0   No facility-administered medications prior to visit.    PAST MEDICAL HISTORY: Past Medical History:  Diagnosis Date   Hyperlipidemia    Hypertension    MS (multiple sclerosis) (HCC)    NSTEMI (non-ST elevated myocardial infarction) (HCC)    NSVT (nonsustained ventricular tachycardia) (HCC) 07/02/2019   S/P angioplasty with stent    DES to LAD and Lcx     PAST SURGICAL HISTORY: Past Surgical History:  Procedure Laterality Date   ABDOMINAL AORTOGRAM W/LOWER EXTREMITY N/A 07/10/2021   Procedure: ABDOMINAL AORTOGRAM W/LOWER EXTREMITY;  Surgeon: Cephus Shelling, MD;  Location: [facility];  Service: Cardiovascular;  Laterality: N/A;   ABDOMINAL AORTOGRAM W/LOWER EXTREMITY Bilateral 04/09/2022   Procedure: ABDOMINAL AORTOGRAM W/LOWER EXTREMITY;   Surgeon: Cephus Shelling, MD;  Location: [facility];  Service: Cardiovascular;  Laterality: Bilateral;   CHOLECYSTECTOMY     CORONARY BALLOON ANGIOPLASTY N/A 07/03/2019   Procedure: CORONARY BALLOON ANGIOPLASTY;  Surgeon: Yvonne Kendall, MD;  Location: [facility];  Service: Cardiovascular;  Laterality: N/A;  distal lad   CORONARY STENT INTERVENTION N/A 07/03/2019   Procedure: CORONARY STENT INTERVENTION;  Surgeon: Yvonne Kendall, MD;  Location: [facility];  Service: Cardiovascular;  Laterality: N/A;  cfx lad    CORONARY ULTRASOUND/IVUS N/A 07/03/2019   Procedure: Intravascular Ultrasound/IVUS;  Surgeon: Yvonne Kendall, MD;  Location: [facility];  Service: Cardiovascular;  Laterality: N/A;   LEFT HEART CATH AND CORONARY ANGIOGRAPHY N/A 07/03/2019   Procedure: LEFT HEART  CATH AND CORONARY ANGIOGRAPHY;  Surgeon: Yvonne Kendall, MD;  Location: [facility];  Service: Cardiovascular;  Laterality: N/A;   PERIPHERAL VASCULAR BALLOON ANGIOPLASTY Bilateral 04/09/2022   Procedure: PERIPHERAL VASCULAR BALLOON ANGIOPLASTY;  Surgeon: Cephus Shelling, MD;  Location: [facility];  Service: Cardiovascular;  Laterality: Bilateral;  left and right common iliac   PERIPHERAL VASCULAR INTERVENTION  07/10/2021   Procedure: PERIPHERAL VASCULAR INTERVENTION;  Surgeon: Cephus Shelling, MD;  Location: [facility];  Service: Cardiovascular;;  Bilateral Common Iliac Stents   PERIPHERAL VASCULAR INTERVENTION Right 04/09/2022   Procedure: PERIPHERAL VASCULAR INTERVENTION;  Surgeon: Cephus Shelling, MD;  Location: [facility];  Service: Cardiovascular;  Laterality: Right;  right common iliac   TUBAL LIGATION      FAMILY HISTORY: Family History  Problem Relation Age of Onset   Sudden Cardiac Death Maternal Grandfather     SOCIAL HISTORY: Social History   Socioeconomic History   Marital status: Single    Spouse name: Not on file   Number  of children: Not on file   Years of education: Not on file   Highest education level: Some college, no degree  Occupational History   Not on file  Tobacco Use   Smoking status: Every Day    Current packs/day: 0.50    Types: Cigarettes    Passive exposure: Current   Smokeless tobacco: Never  Vaping Use   Vaping status: Some Days  Substance and Sexual Activity   Alcohol use: Never   Drug use: Yes    Types: Marijuana   Sexual activity: Yes  Other Topics Concern   Not on file  Social History Narrative   Right Handed   2 Cups of Coffee per Day   Social Drivers of Health   Financial Resource Strain: Patient Declined (01/29/2023)   Overall Financial Resource Strain (CARDIA)    Difficulty of Paying Living Expenses: Patient declined  Recent Concern: Financial Resource Strain - Medium Risk (12/11/2022)   Overall Financial Resource Strain (CARDIA)    Difficulty of Paying Living Expenses: Somewhat hard  Food Insecurity: Patient Declined (01/29/2023)   Hunger Vital Sign    Worried About Running Out of Food in the Last Year: Patient declined    Ran Out of Food in the Last Year: Patient declined  Recent Concern: Food Insecurity - Food Insecurity Present (12/11/2022)   Hunger Vital Sign    Worried About Running Out of Food in the Last Year: Sometimes true    Ran Out of Food in the Last Year: Sometimes true  Transportation Needs: No Transportation Needs (01/29/2023)   PRAPARE - Administrator, Civil Service (Medical): No    Lack of Transportation (Non-Medical): No  Physical Activity: Sufficiently Active (01/29/2023)   Exercise Vital Sign    Days of Exercise per Week: 3 days    Minutes of Exercise per Session: 60 min  Recent Concern: Physical Activity - Insufficiently Active (12/11/2022)   Exercise Vital Sign    Days of Exercise per Week: 2 days    Minutes of Exercise per Session: 60 min  Stress: Stress Concern Present (01/29/2023)   [employer] of Occupational  Health - Occupational Stress Questionnaire    Feeling of Stress : Very much  Social Connections: Moderately Isolated (01/29/2023)   Social Connection and Isolation Panel [NHANES]    Frequency of Communication with Friends and Family: More than three times a week    Frequency of Social Gatherings  with Friends and Family: Patient declined    Attends Religious Services: Never    Database administrator or Organizations: No    Attends Engineer, structural: Not on file    Marital Status: Living with partner  Intimate Partner Violence: Unknown (05/22/2021)   Received from [employer], [facility]   HITS    Physically Hurt: Not on file    Insult or Talk Down To: Not on file    Threaten Physical Harm: Not on file    Scream or Curse: Not on file      PHYSICAL EXAM Generalized: Well developed, in no acute distress   Neurological examination  Mentation: Alert oriented to time, place, history taking. Follows all commands speech and language fluent Cranial nerve II-XII:Extraocular movements were full. Facial symmetry noted.  DIAGNOSTIC DATA (LABS, IMAGING, TESTING) - I reviewed patient records, labs, notes, testing and imaging myself where available.  Lab Results  Component Value Date   WBC 10.0 03/27/2022   HGB 13.6 04/09/2022   HCT 40.0 04/09/2022   MCV 95 03/27/2022   PLT 267 03/27/2022      Component Value Date/Time   NA 141 04/13/2022 1539   K 4.3 04/13/2022 1539   CL 102 04/13/2022 1539   CO2 23 04/13/2022 1539   GLUCOSE 92 04/13/2022 1539   GLUCOSE 90 04/09/2022 0827   BUN 9 04/13/2022 1539   CREATININE 0.71 04/13/2022 1539   CALCIUM 9.6 04/13/2022 1539   PROT 7.3 03/27/2022 1128   ALBUMIN 4.6 03/27/2022 1128   AST 22 03/27/2022 1128   ALT 26 03/27/2022 1128   ALKPHOS 83 03/27/2022 1128   BILITOT 0.5 03/27/2022 1128   GFRNONAA >60 07/10/2021 1027   GFRAA 119 08/16/2019 1140   Lab Results  Component Value Date   CHOL 131 09/27/2020   HDL 31 (L)  09/27/2020   LDLCALC 85 09/27/2020   LDLDIRECT 18 04/13/2022   TRIG 74 09/27/2020   CHOLHDL 4.2 09/27/2020   Lab Results  Component Value Date   HGBA1C 5.3 04/13/2022   No results found for: "VITAMINB12" Lab Results  Component Value Date   TSH 0.782 05/16/2021      ASSESSMENT AND PLAN 43 y.o. year old female  has a past medical history of Hyperlipidemia, Hypertension, MS (multiple sclerosis) (HCC), NSTEMI (non-ST elevated myocardial infarction) (HCC), NSVT (nonsustained ventricular tachycardia) (HCC) (07/02/2019), and S/P angioplasty with stent. here with:  1.  Migraine headache  -Increase Topamax to 50 mg in the morning and 100 mg at bedtime -Reviewed side effects of Topamax. -Try Ubrelvy 100 mg for abortive therapy.  Take 1 tablet at the onset of a migraine can repeat in 2 hours if headache has not resolved.  Only 2 tablets in 24 hours -Follow-up in 3 to 4 months or sooner if needed  Butch Penny, MSN, NP-C 03/19/2023, 10:25 AM [facility] [address], [address] [place], [place] [address] [phone]

## 2023-03-19 NOTE — Patient Instructions (Signed)
Increase Topamax to 50 mg in the morning and 100 mg at bedtime Try Ubrelvy 100 mg tablet for abortive therapy.  Take 1 tablet at the onset of a migraine can repeat in 2 hours if the headache has not resolved.  No more than 2 tablets in 24 hours If your symptoms worsen or you develop new symptoms please let us know.

## 2023-04-06 ENCOUNTER — Telehealth: Payer: Self-pay

## 2023-04-06 ENCOUNTER — Other Ambulatory Visit (HOSPITAL_COMMUNITY): Payer: Self-pay

## 2023-04-06 DIAGNOSIS — E7800 Pure hypercholesterolemia, unspecified: Secondary | ICD-10-CM

## 2023-04-06 NOTE — Telephone Encounter (Signed)
 Pharmacy Patient Advocate Encounter   Received notification from CoverMyMeds that prior authorization for REPATHA is required/requested.   Insurance verification completed.   The patient is insured through [facility] .   Per test claim: PA required; PA started via CoverMyMeds. KEY [id number] . Please see clinical question(s) below that I am not finding the answer to in her chart and advise.  PLAN REQUIRES LDL-C WITHIN THE LAST 6 MONTHS, PLEASE ADVISE

## 2023-04-07 ENCOUNTER — Encounter: Payer: Self-pay | Admitting: Pharmacist

## 2023-04-08 NOTE — Telephone Encounter (Signed)
 Detailed message left on VM about needing labs (per DPR)

## 2023-04-10 ENCOUNTER — Other Ambulatory Visit (HOSPITAL_BASED_OUTPATIENT_CLINIC_OR_DEPARTMENT_OTHER): Payer: Self-pay | Admitting: Family

## 2023-04-10 DIAGNOSIS — I739 Peripheral vascular disease, unspecified: Secondary | ICD-10-CM

## 2023-04-10 DIAGNOSIS — I25118 Atherosclerotic heart disease of native coronary artery with other forms of angina pectoris: Secondary | ICD-10-CM

## 2023-04-19 ENCOUNTER — Telehealth: Payer: Self-pay | Admitting: Pharmacist

## 2023-04-19 NOTE — Telephone Encounter (Signed)
 Pharmacy Patient Advocate Encounter   Received notification from Patient Pharmacy that prior authorization for Ubrelvy 100MG  tablets is required/requested.   Insurance verification completed.   The patient is insured through [facility] .   Per test claim: PA required; PA submitted to above mentioned insurance via CoverMyMeds Key/confirmation #/EOC [id number] Status is pending

## 2023-04-20 NOTE — Telephone Encounter (Signed)
 Pharmacy Patient Advocate Encounter  Received notification from [facility] that Prior Authorization for Ubrelvy 100MG  tablets  has been APPROVED from 04/19/2023 to 04/18/2024   PA #/Case ID/Reference #: [id number]

## 2023-05-04 NOTE — Telephone Encounter (Signed)
 Left another detailed message that labs are needed for PA

## 2023-06-04 ENCOUNTER — Ambulatory Visit: Payer: 59 | Admitting: Internal Medicine

## 2023-06-18 ENCOUNTER — Ambulatory Visit: Payer: 59 | Admitting: Adult Health

## 2023-06-18 ENCOUNTER — Encounter: Payer: Self-pay | Admitting: Adult Health

## 2023-06-25 ENCOUNTER — Encounter: Payer: Self-pay | Admitting: Internal Medicine

## 2023-06-25 ENCOUNTER — Other Ambulatory Visit: Payer: Self-pay

## 2023-06-25 ENCOUNTER — Ambulatory Visit: Payer: Self-pay | Attending: Internal Medicine | Admitting: Internal Medicine

## 2023-06-25 VITALS — BP 116/75 | HR 79 | Ht 62.0 in | Wt 144.8 lb

## 2023-06-25 DIAGNOSIS — F17200 Nicotine dependence, unspecified, uncomplicated: Secondary | ICD-10-CM

## 2023-06-25 DIAGNOSIS — I25118 Atherosclerotic heart disease of native coronary artery with other forms of angina pectoris: Secondary | ICD-10-CM

## 2023-06-25 DIAGNOSIS — G43109 Migraine with aura, not intractable, without status migrainosus: Secondary | ICD-10-CM

## 2023-06-25 DIAGNOSIS — Z599 Problem related to housing and economic circumstances, unspecified: Secondary | ICD-10-CM

## 2023-06-25 DIAGNOSIS — Z6826 Body mass index (BMI) 26.0-26.9, adult: Secondary | ICD-10-CM

## 2023-06-25 DIAGNOSIS — F17210 Nicotine dependence, cigarettes, uncomplicated: Secondary | ICD-10-CM

## 2023-06-25 DIAGNOSIS — Z6379 Other stressful life events affecting family and household: Secondary | ICD-10-CM

## 2023-06-25 DIAGNOSIS — E663 Overweight: Secondary | ICD-10-CM

## 2023-06-25 MED ORDER — UBRELVY 100 MG PO TABS: ORAL_TABLET | ORAL | 11 refills | Status: DC

## 2023-06-25 MED ORDER — ATORVASTATIN CALCIUM 80 MG PO TABS: 80.0000 mg | ORAL_TABLET | Freq: Every day | ORAL | 3 refills | Status: AC

## 2023-06-25 MED ORDER — TOPIRAMATE 50 MG PO TABS: ORAL_TABLET | ORAL | 5 refills | Status: AC

## 2023-06-25 MED ORDER — CLOPIDOGREL BISULFATE 75 MG PO TABS: 75.0000 mg | ORAL_TABLET | Freq: Every day | ORAL | 3 refills | Status: AC

## 2023-06-25 MED ORDER — NITROGLYCERIN 0.4 MG SL SUBL: 0.4000 mg | SUBLINGUAL_TABLET | SUBLINGUAL | 5 refills | Status: DC | PRN

## 2023-06-25 NOTE — Patient Instructions (Signed)
 VISIT SUMMARY:  During today's visit, we discussed your coronary artery disease, hypertension, chronic migraine, nicotine  dependence, and recent weight loss. We addressed your medication management issues and the impact of financial and insurance challenges on your health. We also talked about your increased stress levels and the importance of maintaining your health despite these difficulties.  YOUR PLAN: Stressful life event: I will have our case worker touch base with you about referral to legal aid to see if we can appeal the denial of Medicaid. We also discussed referral to [facility] behavioral health for some counseling.  I have submitted that referral already.  -CORONARY ARTERY DISEASE: We have sent refill to the pharmacy on atorvastatin , Plavix  and sublingual nitroglycerin .  Continue taking aspirin .  Keep upcoming appointment with cardiology.  -HYPERTENSION: Hypertension, or high blood pressure, can lead to serious health problems if not managed properly. We will prescribe antihypertensive medications to help control your blood pressure. Please apply for the [facility] discount card and we will refer you to a caseworker for Medicaid appeal assistance.  -CHRONIC MIGRAINE: Chronic migraine is a condition characterized by frequent and severe headaches.  I have sent refill to our pharmacy for topiramate  and Ubrelvy .  I am not sure that we carry the Ubrelvy   -NICOTINE  DEPENDENCE: Nicotine  dependence means you are addicted to nicotine , commonly from smoking cigarettes. We understand quitting is challenging, especially under stress, but it is important for your overall health. We encourage you to seek support when you feel ready to quit.  -WEIGHT LOSS: Your recent weight loss is likely due to stress, decreased appetite, and the effects of Topamax . It is good that you are engaging in regular exercise. Please continue with healthy eating and exercise to maintain your health.  INSTRUCTIONS:  Please  make sure to attend your upcoming cardiologist appointment. Apply for the [facility] discount card to help with medication costs and follow up with the caseworker for Medicaid appeal assistance. We have also referred you to [facility] for counseling. Continue with your healthy eating and exercise routine.
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
## 2023-06-28 ENCOUNTER — Telehealth: Payer: Self-pay

## 2023-06-28 NOTE — Telephone Encounter (Signed)
 Message received from Dr Lincoln Renshaw noting patient has been denied Medicaid and inquiring if Legal Aid of [place] [facility])  can assist with an appeal. I tried to reach the patient : [phone] and had to leave a message requesting a call back.  Need to obtain more information about her denial to see if [facility] can assist.

## 2023-06-29 NOTE — Telephone Encounter (Signed)
 I spoke to the patient and she explained that she applied for Medicaid and was denied but she could not remember when she received the denial.  She also stated that she did not appeal the denial. I explained to her that I can refer her to Legal Aid of [place] for possible assistance with appealing the denial  There is no guarantee that they can help; but we can try.  She was in agreement to placing that referral. I also explained that if she does not qualify for Medicaid, [facility] may be able to assist her with navigating the Marketplace for affordable insurance options.  The referral was then placed.   She went on to say that her medications are very expensive and she only has some of them.    She is missing: Lipitor , Repatha , Ubrelvy , Flonase , and Nitrostat .   I explained to her that our [employer] has patient assistance programs for some medications and also has some medications for little to no cost if she qualifies.  She said she can't come to [facility] to the pharmacy to pick up meds and I said meds can be mailed to her.  She stated that she called the [employer] and was told that some of her medications cannot be mailed. I told her that I would check with our clinical pharmacist to see if he has any suggestions.   I asked if she has transportation and she said yes, but [place] from [place],   [place]- your thoughts/suggestions?

## 2023-06-30 ENCOUNTER — Other Ambulatory Visit: Payer: Self-pay

## 2023-06-30 NOTE — Telephone Encounter (Signed)
 Will await Luke's response. The Repatha  is actually prescribed through her cardiologist. She may have to touch base with them to see if they have a PA program for that.

## 2023-06-30 NOTE — Telephone Encounter (Signed)
 Grace Castillo can you please touch base with her and let her know about the meds that can be mailed and about the Ubrelvy ? Thanks.

## 2023-07-01 NOTE — Telephone Encounter (Signed)
 I called the patient : [phone]  and had to leave a message requesting a call back

## 2023-07-07 NOTE — Telephone Encounter (Signed)
 I tried to reach the patient again : [phone]  and had to leave a message requesting a call back

## 2023-07-08 NOTE — Telephone Encounter (Signed)
 I reached the patient and she said she was at work and could not talk now and she planned to call the office tomorrow, 07/09/2023.  I told her that if I am not here, she can ask for Cassie.  She to be informed that she needs to speak to Radene Buffalo, CPhT about medication delivery an patient assistance.  Loetta Ringer has tried to reach out to her.

## 2023-07-21 NOTE — Telephone Encounter (Signed)
 I received a message from Keisha/ Legal Aid of [place] stating that they have not been able to reach the patient despite multiple attempts.

## 2023-09-14 ENCOUNTER — Ambulatory Visit (INDEPENDENT_AMBULATORY_CARE_PROVIDER_SITE_OTHER): Payer: Self-pay | Admitting: Licensed Clinical Social Worker

## 2023-09-14 ENCOUNTER — Encounter (HOSPITAL_COMMUNITY): Payer: Self-pay | Admitting: Licensed Clinical Social Worker

## 2023-09-14 DIAGNOSIS — F411 Generalized anxiety disorder: Secondary | ICD-10-CM | POA: Insufficient documentation

## 2023-09-14 DIAGNOSIS — F331 Major depressive disorder, recurrent, moderate: Secondary | ICD-10-CM | POA: Insufficient documentation

## 2023-09-14 DIAGNOSIS — F4310 Post-traumatic stress disorder, unspecified: Secondary | ICD-10-CM | POA: Diagnosis not present
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
## 2023-10-19 ENCOUNTER — Ambulatory Visit (INDEPENDENT_AMBULATORY_CARE_PROVIDER_SITE_OTHER): Admitting: Clinical

## 2023-10-19 DIAGNOSIS — F331 Major depressive disorder, recurrent, moderate: Secondary | ICD-10-CM | POA: Diagnosis not present
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
## 2023-10-29 ENCOUNTER — Ambulatory Visit: Payer: Self-pay | Attending: Internal Medicine | Admitting: Internal Medicine

## 2023-10-29 ENCOUNTER — Encounter: Payer: Self-pay | Admitting: Internal Medicine

## 2023-10-29 ENCOUNTER — Other Ambulatory Visit: Payer: Self-pay

## 2023-10-29 VITALS — BP 118/79 | HR 76 | Temp 97.7°F | Wt 137.0 lb

## 2023-10-29 DIAGNOSIS — I25118 Atherosclerotic heart disease of native coronary artery with other forms of angina pectoris: Secondary | ICD-10-CM

## 2023-10-29 DIAGNOSIS — G43109 Migraine with aura, not intractable, without status migrainosus: Secondary | ICD-10-CM

## 2023-10-29 DIAGNOSIS — J208 Acute bronchitis due to other specified organisms: Secondary | ICD-10-CM

## 2023-10-29 DIAGNOSIS — R040 Epistaxis: Secondary | ICD-10-CM

## 2023-10-29 DIAGNOSIS — R0602 Shortness of breath: Secondary | ICD-10-CM

## 2023-10-29 DIAGNOSIS — Z79899 Other long term (current) drug therapy: Secondary | ICD-10-CM

## 2023-10-29 DIAGNOSIS — Z7982 Long term (current) use of aspirin: Secondary | ICD-10-CM

## 2023-10-29 DIAGNOSIS — F17200 Nicotine dependence, unspecified, uncomplicated: Secondary | ICD-10-CM

## 2023-10-29 DIAGNOSIS — F330 Major depressive disorder, recurrent, mild: Secondary | ICD-10-CM

## 2023-10-29 DIAGNOSIS — I739 Peripheral vascular disease, unspecified: Secondary | ICD-10-CM

## 2023-10-29 DIAGNOSIS — Z2821 Immunization not carried out because of patient refusal: Secondary | ICD-10-CM

## 2023-10-29 MED ORDER — UBRELVY 100 MG PO TABS: ORAL_TABLET | ORAL | 11 refills | Status: DC

## 2023-10-29 MED ORDER — NITROGLYCERIN 0.4 MG SL SUBL
0.4000 mg | SUBLINGUAL_TABLET | SUBLINGUAL | 5 refills | Status: AC | PRN
  Filled 2023-10-29: qty 25, 9d supply, fill #0

## 2023-10-29 MED ORDER — NITROGLYCERIN 0.4 MG SL SUBL: 0.4000 mg | SUBLINGUAL_TABLET | SUBLINGUAL | 5 refills | Status: DC | PRN

## 2023-10-29 MED ORDER — UBRELVY 100 MG PO TABS
ORAL_TABLET | ORAL | 3 refills | Status: AC
  Filled 2023-10-29: qty 12, fill #0

## 2023-10-29 MED ORDER — BENZONATATE 100 MG PO CAPS
100.0000 mg | ORAL_CAPSULE | Freq: Three times a day (TID) | ORAL | 0 refills | Status: AC | PRN
  Filled 2023-10-29: qty 20, 7d supply, fill #0

## 2023-10-29 MED ORDER — ALBUTEROL SULFATE HFA 108 (90 BASE) MCG/ACT IN AERS: 2.0000 | INHALATION_SPRAY | Freq: Four times a day (QID) | RESPIRATORY_TRACT | 2 refills | Status: DC | PRN

## 2023-10-29 MED ORDER — BENZONATATE 100 MG PO CAPS: 100.0000 mg | ORAL_CAPSULE | Freq: Three times a day (TID) | ORAL | 0 refills | Status: DC | PRN

## 2023-10-29 MED ORDER — ALBUTEROL SULFATE HFA 108 (90 BASE) MCG/ACT IN AERS
2.0000 | INHALATION_SPRAY | Freq: Four times a day (QID) | RESPIRATORY_TRACT | 2 refills | Status: AC | PRN
  Filled 2023-10-29: qty 6.7, 25d supply, fill #0

## 2023-10-29 NOTE — Patient Instructions (Signed)
 VISIT SUMMARY:  Today, you were seen for shortness of breath, chest pain, and other symptoms. We discussed your heart disease, peripheral artery disease, respiratory infection, nosebleeds, migraines, depression, and smoking habits. We have made several adjustments to your treatment plan and provided referrals for further evaluation.  YOUR PLAN:  -ATHEROSCLEROTIC HEART DISEASE WITH ANGINA: This condition involves the narrowing of the heart's arteries, which can cause chest pain and shortness of breath. Continue taking atorvastatin , aspirin , and clopidogrel  daily. A refill for nitroglycerin  has been sent to your pharmacy. You will be referred to a cardiologist for further evaluation and assistance with obtaining Repatha .  -PERIPHERAL ARTERY DISEASE OF LOWER EXTREMITIES: This condition involves reduced blood flow to your legs, causing swelling and purple toes. You will be referred to a vascular specialist for further evaluation.  -ACUTE UPPER RESPIRATORY INFECTION WITH PRODUCTIVE COUGH: This is likely a viral bronchitis, causing your cough and congestion. An albuterol  inhaler and Tessalon  Perles have been prescribed for use as needed. Follow up if there is no improvement.  -EPISTAXIS, LEFT-SIDED: This refers to nosebleeds, likely related to your medications. Use saline nasal spray and consider using a humidifier at night. You will be referred to an ENT specialist for further evaluation. Please stop at the lab for a blood count to check your platelet levels.  -MIGRAINE WITH AURA: You have migraines with sensory disturbances. Visit the pharmacy and speak with Burnard about the patient assistance program for Ubrelvy .  -MAJOR DEPRESSIVE DISORDER, SINGLE EPISODE: You have been experiencing depression and prefer therapy without medication. A follow-up appointment with your counselor has been scheduled.  -NICOTINE  DEPENDENCE: You are currently smoking four cigarettes per day and are trying to quit. Continue to  work on smoking cessation.  INSTRUCTIONS:  Please follow up with the cardiologist and vascular specialist as referred. Use the prescribed medications as directed and follow up if your symptoms do not improve. Visit the pharmacy to discuss the patient assistance program for Ubrelvy . Stop at the lab for a blood count to check your platelet levels. Continue your counseling sessions and work on smoking cessation.
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
## 2023-10-30 LAB — COMPREHENSIVE METABOLIC PANEL WITH GFR
ALT: 19 IU/L (ref 0–32)
AST: 20 IU/L (ref 0–40)
Albumin: 4.6 g/dL (ref 3.9–4.9)
Alkaline Phosphatase: 90 IU/L (ref 44–121)
BUN/Creatinine Ratio: 12 (ref 9–23)
BUN: 8 mg/dL (ref 6–24)
Bilirubin Total: 0.4 mg/dL (ref 0.0–1.2)
CO2: 19 mmol/L — ABNORMAL LOW (ref 20–29)
Calcium: 9.4 mg/dL (ref 8.7–10.2)
Chloride: 108 mmol/L — ABNORMAL HIGH (ref 96–106)
Creatinine, Ser: 0.68 mg/dL (ref 0.57–1.00)
Globulin, Total: 2.8 g/dL (ref 1.5–4.5)
Glucose: 81 mg/dL (ref 70–99)
Potassium: 4.3 mmol/L (ref 3.5–5.2)
Sodium: 141 mmol/L (ref 134–144)
Total Protein: 7.4 g/dL (ref 6.0–8.5)
eGFR: 111 mL/min/1.73 (ref >59)

## 2023-10-30 LAB — LIPID PANEL
Chol/HDL Ratio: 3.1 ratio (ref 0.0–4.4)
Cholesterol, Total: 108 mg/dL (ref 100–199)
HDL: 35 mg/dL — ABNORMAL LOW (ref >39)
LDL Chol Calc (NIH): 57 mg/dL (ref 0–99)
Triglycerides: 77 mg/dL (ref 0–149)
VLDL Cholesterol Cal: 16 mg/dL (ref 5–40)

## 2023-10-30 LAB — CBC
Hematocrit: 44.1 % (ref 34.0–46.6)
Hemoglobin: 14.6 g/dL (ref 11.1–15.9)
MCH: 33.2 pg — ABNORMAL HIGH (ref 26.6–33.0)
MCHC: 33.1 g/dL (ref 31.5–35.7)
MCV: 100 fL — ABNORMAL HIGH (ref 79–97)
Platelets: 246 x10E3/uL (ref 150–450)
RBC: 4.4 x10E6/uL (ref 3.77–5.28)
RDW: 14.2 % (ref 11.7–15.4)
WBC: 14 x10E3/uL — ABNORMAL HIGH (ref 3.4–10.8)

## 2023-10-30 LAB — D-DIMER, QUANTITATIVE: D-DIMER: 0.46 mg{FEU}/L (ref 0.00–0.49)

## 2023-10-31 ENCOUNTER — Ambulatory Visit: Payer: Self-pay | Admitting: Internal Medicine

## 2023-11-01 ENCOUNTER — Encounter (INDEPENDENT_AMBULATORY_CARE_PROVIDER_SITE_OTHER): Payer: Self-pay

## 2023-11-04 ENCOUNTER — Other Ambulatory Visit: Payer: Self-pay

## 2023-11-04 DIAGNOSIS — I739 Peripheral vascular disease, unspecified: Secondary | ICD-10-CM

## 2023-11-04 DIAGNOSIS — I771 Stricture of artery: Secondary | ICD-10-CM

## 2023-11-29 ENCOUNTER — Ambulatory Visit (HOSPITAL_COMMUNITY): Admitting: Clinical

## 2023-12-08 ENCOUNTER — Ambulatory Visit: Payer: Self-pay

## 2023-12-08 ENCOUNTER — Ambulatory Visit (HOSPITAL_COMMUNITY): Payer: Self-pay

## 2023-12-08 ENCOUNTER — Encounter (HOSPITAL_COMMUNITY): Payer: Self-pay

## 2023-12-13 ENCOUNTER — Ambulatory Visit (HOSPITAL_COMMUNITY): Admitting: Clinical

## 2023-12-20 ENCOUNTER — Ambulatory Visit (INDEPENDENT_AMBULATORY_CARE_PROVIDER_SITE_OTHER): Payer: Self-pay

## 2023-12-20 ENCOUNTER — Encounter (INDEPENDENT_AMBULATORY_CARE_PROVIDER_SITE_OTHER): Payer: Self-pay

## 2023-12-20 VITALS — BP 122/79 | HR 88 | Temp 98.3°F | Ht 62.0 in | Wt 135.0 lb

## 2023-12-20 DIAGNOSIS — R0981 Nasal congestion: Secondary | ICD-10-CM | POA: Diagnosis not present

## 2023-12-20 DIAGNOSIS — J3489 Other specified disorders of nose and nasal sinuses: Secondary | ICD-10-CM

## 2023-12-20 DIAGNOSIS — R040 Epistaxis: Secondary | ICD-10-CM | POA: Diagnosis not present

## 2023-12-20 DIAGNOSIS — F17210 Nicotine dependence, cigarettes, uncomplicated: Secondary | ICD-10-CM | POA: Diagnosis not present

## 2023-12-20 DIAGNOSIS — R634 Abnormal weight loss: Secondary | ICD-10-CM | POA: Diagnosis not present

## 2023-12-20 DIAGNOSIS — F17200 Nicotine dependence, unspecified, uncomplicated: Secondary | ICD-10-CM

## 2023-12-20 DIAGNOSIS — R070 Pain in throat: Secondary | ICD-10-CM | POA: Diagnosis not present

## 2023-12-20 MED ORDER — SALINE SPRAY 0.65 % NA SOLN: 1.0000 | Freq: Four times a day (QID) | NASAL | 0 refills | Status: AC

## 2023-12-20 MED ORDER — FAMOTIDINE 20 MG PO TABS
20.0000 mg | ORAL_TABLET | Freq: Two times a day (BID) | ORAL | 1 refills | Status: AC
Start: 2023-12-20 — End: ?

## 2023-12-20 MED ORDER — MUPIROCIN 2 % EX OINT: 1.0000 | TOPICAL_OINTMENT | Freq: Two times a day (BID) | CUTANEOUS | 0 refills | Status: AC

## 2023-12-20 NOTE — Progress Notes (Signed)
 Dear Dr. Vicci, Here is my assessment for our mutual patient, Grace Castillo. Thank you for allowing me the opportunity to care for your patient. Please do not hesitate to contact me should you have any other questions. Sincerely, Dr. Penne Croak  Otolaryngology Clinic Note Referring provider: Dr. Vicci HPI:  Discussed the use of AI scribe software for clinical note transcription with the patient, who gave verbal consent to proceed.  History of Present Illness Grace Castillo is a 43 year old female with a history of heart problems and stents who presents with recurrent nosebleeds.  Epistaxis - Recurrent nosebleeds for over one month - Primarily from the left nostril, occasionally from the right - Bleeding is more frequent in the mornings - Epistaxis often occurs when blowing her nose - Currently using Flonase  nasal spray, which worsens symptoms  Nasal congestion and oropharyngeal symptoms - Nasal congestion present - History of sore throats, attributed to smoking - Occasional pain with swallowing - Hoarseness of voice in the mornings, attributed to smoking  Unintentional weight loss - Weight loss over the past year, from nearly 200 pounds to current weight - No changes in diet or exercise  Cardiovascular history and antiplatelet therapy - History of three major myocardial infarctions - Coronary stents placed - Poor circulation in lower extremities, with two prior vascular procedures - Currently taking aspirin  and clopidogrel   Tobacco use - Current smoker, reduced from three packs per day to less than one pack per day - Actively reducing intake with goal to quit by the first of the year - Previous unsuccessful attempts to quit with patches and gum   Independent Review of Additional Tests or Records:  Reviewed external note from referring PCP, Johnson,describing relevant history incorporated into today's evaluation.   PMH/Meds/All/SocHx/FamHx/ROS:   Past Medical  History:  Diagnosis Date   Hyperlipidemia    Hypertension    MS (multiple sclerosis)    NSTEMI (non-ST elevated myocardial infarction) (HCC)    NSVT (nonsustained ventricular tachycardia) (HCC) 07/02/2019   S/P angioplasty with stent    DES to LAD and Lcx      Past Surgical History:  Procedure Laterality Date   ABDOMINAL AORTOGRAM W/LOWER EXTREMITY N/A 07/10/2021   Procedure: ABDOMINAL AORTOGRAM W/LOWER EXTREMITY;  Surgeon: Gretta Lonni PARAS, MD;  Location: [facility];  Service: Cardiovascular;  Laterality: N/A;   ABDOMINAL AORTOGRAM W/LOWER EXTREMITY Bilateral 04/09/2022   Procedure: ABDOMINAL AORTOGRAM W/LOWER EXTREMITY;  Surgeon: Gretta Lonni PARAS, MD;  Location: [facility];  Service: Cardiovascular;  Laterality: Bilateral;   CHOLECYSTECTOMY     CORONARY BALLOON ANGIOPLASTY N/A 07/03/2019   Procedure: CORONARY BALLOON ANGIOPLASTY;  Surgeon: Mady Lonni, MD;  Location: [facility];  Service: Cardiovascular;  Laterality: N/A;  distal lad   CORONARY STENT INTERVENTION N/A 07/03/2019   Procedure: CORONARY STENT INTERVENTION;  Surgeon: Mady Lonni, MD;  Location: [facility];  Service: Cardiovascular;  Laterality: N/A;  cfx lad    CORONARY ULTRASOUND/IVUS N/A 07/03/2019   Procedure: Intravascular Ultrasound/IVUS;  Surgeon: Mady Lonni, MD;  Location: [facility];  Service: Cardiovascular;  Laterality: N/A;   LEFT HEART CATH AND CORONARY ANGIOGRAPHY N/A 07/03/2019   Procedure: LEFT HEART CATH AND CORONARY ANGIOGRAPHY;  Surgeon: Mady Lonni, MD;  Location: [facility];  Service: Cardiovascular;  Laterality: N/A;   PERIPHERAL VASCULAR BALLOON ANGIOPLASTY Bilateral 04/09/2022   Procedure: PERIPHERAL VASCULAR BALLOON ANGIOPLASTY;  Surgeon: Gretta Lonni PARAS, MD;  Location: [facility];  Service:  Cardiovascular;  Laterality: Bilateral;  left and right common iliac   PERIPHERAL VASCULAR INTERVENTION  07/10/2021   Procedure:  PERIPHERAL VASCULAR INTERVENTION;  Surgeon: Gretta Lonni PARAS, MD;  Location: [facility];  Service: Cardiovascular;;  Bilateral Common Iliac Stents   PERIPHERAL VASCULAR INTERVENTION Right 04/09/2022   Procedure: PERIPHERAL VASCULAR INTERVENTION;  Surgeon: Gretta Lonni PARAS, MD;  Location: [facility];  Service: Cardiovascular;  Laterality: Right;  right common iliac   TUBAL LIGATION      Family History  Problem Relation Age of Onset   Sudden Cardiac Death Maternal Grandfather      Social Connections: Socially Isolated (09/14/2023)   Social Connection and Isolation Panel    Frequency of Communication with Friends and Family: More than three times a week    Frequency of Social Gatherings with Friends and Family: Once a week    Attends Religious Services: Never    Database Administrator or Organizations: No    Attends Banker Meetings: Never    Marital Status: Never married      Current Outpatient Medications:    famotidine (PEPCID) 20 MG tablet, Take 1 tablet (20 mg total) by mouth 2 (two) times daily., Disp: 180 tablet, Rfl: 1   mupirocin ointment (BACTROBAN) 2 %, Apply 1 Application topically 2 (two) times daily. Apply small amount inside nose on both sides for 10 days, Disp: 22 g, Rfl: 0   sodium chloride  (OCEAN) 0.65 % SOLN nasal spray, Place 1 spray into both nostrils in the morning, at noon, in the evening, and at bedtime., Disp: 88 mL, Rfl: 0   albuterol  (VENTOLIN  HFA) 108 (90 Base) MCG/ACT inhaler, Inhale 2 puffs into the lungs every 6 (six) hours as needed for wheezing or shortness of breath., Disp: 8.5 g, Rfl: 2   aspirin  EC (ASPIRIN  LOW DOSE) 81 MG tablet, TAKE 1 TABLET BY MOUTH EVERY DAY, Disp: 90 tablet, Rfl: 0   atorvastatin  (LIPITOR ) 80 MG tablet, Take 1 tablet (80 mg total) by mouth daily., Disp: 90 tablet, Rfl: 3   benzonatate  (TESSALON  PERLES) 100 MG capsule, Take 1 capsule (100 mg total) by mouth 3 (three) times daily as needed., Disp: 20  capsule, Rfl: 0   clopidogrel  (PLAVIX ) 75 MG tablet, Take 1 tablet (75 mg total) by mouth daily., Disp: 90 tablet, Rfl: 3   nitroGLYCERIN  (NITROSTAT ) 0.4 MG SL tablet, Place 1 tablet (0.4 mg total) under the tongue every 5 (five) minutes as needed for chest pain., Disp: 25 tablet, Rfl: 5   REPATHA  SURECLICK 140 MG/ML SOAJ, INJECT 140 MG INTO THE SKIN EVERY 14 (FOURTEEN) DAYS. (Patient not taking: Reported on 10/29/2023), Disp: 2 mL, Rfl: 11   topiramate  (TOPAMAX ) 50 MG tablet, Take 1 tablet in the AM and 2 tablets at bedtime., Disp: 90 tablet, Rfl: 5   Ubrogepant  (UBRELVY ) 100 MG TABS, Take 1 tablet at the onset of migraine. Can repeat in 2 hours if needed.  Not to exceed 2 tablets in 24 hours, Disp: 12 tablet, Rfl: 3   Physical Exam:   BP 122/79   Pulse 88   Temp 98.3 F (36.8 C)   Ht 5' 2 (1.575 m)   Wt 135 lb (61.2 kg)   SpO2 98%   BMI 24.69 kg/m   The patient was awake, alert, and appropriate. The external ears were inspected, and otoscopy was performed to evaluate the external auditory canals and tympanic membranes. The nasal cavity and septum were examined for mucosal  changes, obstruction, or discharge. The oral cavity and oropharynx were inspected for mucosal lesions, infection, or tonsillar hypertrophy. The neck was palpated for lymphadenopathy, thyroid abnormalities, or other masses. Cranial nerve function was grossly intact.  Pertinent Findings: Physical Exam HEENT: Nasal passages with dry crust and tenderness bilateral vestibule, no nasal cancer observed. Throat with redness, indicative of sore throat, no throat cancer observed.   Seprately Identifiable Procedures:  I personally ordered, reviewed and interpreted the following with the patient today  Given the patient's symptoms and incomplete visualization of critical sinonasal areas with anterior rhinoscopy, a separately performed diagnostic nasal endoscopy procedure is indicated for a complete rhinologic evaluation per  American Rhinologic Society recommendations (https://www.american-rhinologic.org/position-statements)  I personally ordered, reviewed and interpreted the following with the patient today  Procedure Note Diagnostic Nasal Endoscopy CPT CODE -- 68768 - Mod 25  Prior to initiating any procedures, risks/benefits/alternatives were explained to the patient and verbal consent obtained.  Pre-procedure diagnosis: Concern for mass Post-procedure diagnosis: same Indication: See pre-procedure diagnosis and physical exam above Complications: None apparent EBL: 0 mL Anesthesia: Lidocaine  4% and topical decongestant was topically sprayed in each nasal cavity  Description of Procedure:  Patient was identified. A flexible fiberoptic endoscope was utilized to evaluate the sinonasal cavities, mucosa, sinus ostia and turbinates and septum.  Overall, signs of mucosal inflammation are noted.  Also noted are crusting and tenderness.  No mucopurulence, polyps, or masses noted.   Right Middle meatus: clear Right SE Recess: clear Left MM: clear Left SE Recess: clear Photodocumentation was obtained. Video recording performed  Procedure Note Pre-procedure diagnosis:  Dysphonia and sore throat, significant tobacco use history  Post-procedure diagnosis: Same Procedure: Transnasal Fiberoptic Laryngoscopy, CPT 31575 - Mod 25 Indication: 70lb weight loss with significant tobacco use history. Reported sore throat and morning hoarseness Complications: None apparent EBL: 0 mL  The procedure was undertaken to further evaluate the patient's complaint of throat pain and hoarseness worse in the morning, with mirror exam inadequate for appropriate examination due to gag reflex and poor patient tolerance  Procedure:  Patient was identified as correct patient. Verbal consent was obtained. The nose was sprayed with oxymetazoline and 4% lidocaine . The The flexible laryngoscope was passed through the nose to view the nasal  cavity, pharynx (oropharynx, hypopharynx) and larynx.  The larynx was examined at rest and during multiple phonatory tasks. Documentation was obtained and reviewed with patient. The scope was removed. The patient tolerated the procedure well.  Findings: The nasal cavity and nasopharynx did not reveal any masses or lesions, mucosa appeared to be without obvious lesions. The tongue base, pharyngeal walls, piriform sinuses, vallecula, epiglottis and postcricoid region are normal in appearance EXCEPT: arytenoid edema and erythema L>R. The visualized portion of the subglottis and proximal trachea is widely patent. The vocal folds are mobile bilaterally. There are no lesions on the free edge of the vocal folds nor elsewhere in the larynx worrisome for malignancy.    Video recording performed.   Electronically signed by: Penne Croak, DO 12/20/2023 2:36 PM   Impression & Plans:  Emmilia Sowder is a 43 y.o. female  1. Tobacco use disorder   2. Recurrent epistaxis   3. Nasal congestion   4. Weight loss, unintentional   5. Throat pain in adult   6. Nasal vestibulitis     - Findings and diagnoses discussed in detail with the patient. - Risks, benefits, and alternatives were reviewed. Through shared decision making, the patient elects to proceed with below. Assessment &  Plan Epistaxis and nasal dryness Chronic epistaxis and nasal dryness, likely due to anticoagulant use and nasal dryness. No nasal cancer observed. Flonase  worsened symptoms. - Discontinued Flonase . - Started saline nasal spray four times daily. - Applied nasal ointment twice daily. - Reduce nasal spray and ointment frequency after one week.  Nasal congestion Congestion possibly related to nasal dryness and irritation. No nasal cancer observed. - Continue saline nasal spray.  Sore throat due to gastroesophageal reflux disease (GERD) Sore throat due to GERD. No cancer in throat or vocal cords. - Recommended GERD  treatment.  Nicotine  dependence Current smoker, reduced to less than half a pack per day. Attempting to quit by year-end. Previous cessation aids ineffective or adverse. - Encouraged continued efforts to quit smoking by year-end.  Given the patient's tobacco use, I also discussed cessation and options for cessation, including counseling. Counseled patient on the dangers of tobacco use, advised patient to stop smoking, and reviewed strategies to maximize success. We reviewed treatment options to assist him quit smoking including NRT, Chantix , and Bupropion . Patient is ready to quit, and declined further treatment. Total time spent with this was 4 minutes. Goal of quitting 02/17/2023  CPT 99406 (4-10 mins)  - Orders placed: No orders of the defined types were placed in this encounter.  - Medications prescribed/continued/adjusted:  Meds ordered this encounter  Medications   mupirocin ointment (BACTROBAN) 2 %    Sig: Apply 1 Application topically 2 (two) times daily. Apply small amount inside nose on both sides for 10 days    Dispense:  22 g    Refill:  0   famotidine (PEPCID) 20 MG tablet    Sig: Take 1 tablet (20 mg total) by mouth 2 (two) times daily.    Dispense:  180 tablet    Refill:  1   sodium chloride  (OCEAN) 0.65 % SOLN nasal spray    Sig: Place 1 spray into both nostrils in the morning, at noon, in the evening, and at bedtime.    Dispense:  88 mL    Refill:  0   - Education materials provided to the patient. - Follow up: 8 weeks. Patient instructed to return sooner or go to the ED if new/worsening symptoms develop.  Thank you for allowing me the opportunity to care for your patient. Please do not hesitate to contact me should you have any other questions.  Sincerely, Penne Croak, DO Otolaryngologist (ENT) [facility] Phone: [phone] Fax: [phone]  12/20/2023, 2:36 PM   I spent 65 minutes (exclusive of separately billable procedures) on the day  of the encounter reviewing the patient's chart, seeing the patient face to face, discussing the findings and the treatment plan, and documenting in the EHR.

## 2023-12-23 ENCOUNTER — Other Ambulatory Visit: Payer: Self-pay | Admitting: Internal Medicine

## 2023-12-23 DIAGNOSIS — I739 Peripheral vascular disease, unspecified: Secondary | ICD-10-CM

## 2023-12-23 DIAGNOSIS — E785 Hyperlipidemia, unspecified: Secondary | ICD-10-CM

## 2023-12-23 DIAGNOSIS — I25118 Atherosclerotic heart disease of native coronary artery with other forms of angina pectoris: Secondary | ICD-10-CM

## 2023-12-24 ENCOUNTER — Other Ambulatory Visit: Payer: Self-pay | Admitting: Internal Medicine

## 2023-12-24 DIAGNOSIS — I25118 Atherosclerotic heart disease of native coronary artery with other forms of angina pectoris: Secondary | ICD-10-CM

## 2023-12-24 DIAGNOSIS — E785 Hyperlipidemia, unspecified: Secondary | ICD-10-CM

## 2023-12-24 DIAGNOSIS — I739 Peripheral vascular disease, unspecified: Secondary | ICD-10-CM

## 2023-12-26 ENCOUNTER — Telehealth: Payer: Self-pay | Admitting: Pharmacist Clinician (PhC)/ Clinical Pharmacy Specialist

## 2023-12-26 NOTE — Telephone Encounter (Signed)
 Pharmacy sent message that insurance now prefers Praluent .  Patient had itching with Praluent .  Please do PA for Repatha  - has been taking since 2022 without problem.

## 2023-12-27 ENCOUNTER — Other Ambulatory Visit (HOSPITAL_COMMUNITY): Payer: Self-pay

## 2023-12-27 ENCOUNTER — Telehealth: Payer: Self-pay | Admitting: Pharmacy Technician

## 2023-12-27 DIAGNOSIS — I739 Peripheral vascular disease, unspecified: Secondary | ICD-10-CM

## 2023-12-27 DIAGNOSIS — I25118 Atherosclerotic heart disease of native coronary artery with other forms of angina pectoris: Secondary | ICD-10-CM

## 2023-12-27 DIAGNOSIS — E785 Hyperlipidemia, unspecified: Secondary | ICD-10-CM

## 2023-12-27 NOTE — Telephone Encounter (Signed)
 Pharmacy Patient Advocate Encounter   Received notification from Physician's Office that prior authorization for repatha  is required/requested.   Insurance verification completed.   The patient is insured through [employer].   Per test claim: PA required; PA submitted to above mentioned insurance via Latent Key/confirmation #/EOC [id number] Status is pending
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
## 2023-12-29 ENCOUNTER — Other Ambulatory Visit (HOSPITAL_COMMUNITY): Payer: Self-pay

## 2023-12-29 NOTE — Telephone Encounter (Signed)
 Pharmacy Patient Advocate Encounter  Received notification from [facility] that Prior Authorization for Repatha  has been APPROVED from 12/29/23 to 06/26/24. Ran test claim, Copay is $24.99- one onth. This test claim was processed through [facility]- copay amounts may vary at other pharmacies due to pharmacy/plan contracts, or as the patient moves through the different stages of their insurance plan.   PA #/Case ID/Reference #: [id number]

## 2023-12-29 NOTE — Telephone Encounter (Signed)
 Resent documentation that patient cannot take praluent 

## 2023-12-30 ENCOUNTER — Ambulatory Visit (HOSPITAL_COMMUNITY)
Admission: RE | Admit: 2023-12-30 | Discharge: 2023-12-30 | Disposition: A | Discharge status: Home / Self Care | Payer: Self-pay | Source: Ambulatory Visit | Attending: Vascular Surgery | Admitting: Vascular Surgery

## 2023-12-30 ENCOUNTER — Other Ambulatory Visit: Payer: Self-pay

## 2023-12-30 ENCOUNTER — Ambulatory Visit (HOSPITAL_BASED_OUTPATIENT_CLINIC_OR_DEPARTMENT_OTHER)
Admission: RE | Admit: 2023-12-30 | Discharge: 2023-12-30 | Disposition: A | Discharge status: Home / Self Care | Payer: Self-pay | Source: Ambulatory Visit | Attending: Vascular Surgery | Admitting: Vascular Surgery

## 2023-12-30 ENCOUNTER — Ambulatory Visit: Payer: Self-pay | Admitting: Physician Assistant

## 2023-12-30 VITALS — BP 127/80 | HR 76 | Temp 97.7°F | Wt 136.1 lb

## 2023-12-30 DIAGNOSIS — I739 Peripheral vascular disease, unspecified: Secondary | ICD-10-CM

## 2023-12-30 DIAGNOSIS — I771 Stricture of artery: Secondary | ICD-10-CM

## 2023-12-30 LAB — VAS US ABI WITH/WO TBI
Left ABI: 0.98
Right ABI: 0.99

## 2023-12-31 MED ORDER — REPATHA SURECLICK 140 MG/ML ~~LOC~~ SOAJ: 140.0000 mg | SUBCUTANEOUS | 1 refills | Status: DC

## 2023-12-31 NOTE — Addendum Note (Signed)
 Addended by: Lynniah Janoski L on: 12/31/2023 01:01 PM   Modules accepted: Orders

## 2024-01-06 ENCOUNTER — Ambulatory Visit (HOSPITAL_COMMUNITY)
Admission: RE | Admit: 2024-01-06 | Discharge: 2024-01-06 | Disposition: A | Discharge status: Home / Self Care | Attending: Vascular Surgery | Admitting: Vascular Surgery

## 2024-01-06 ENCOUNTER — Encounter (HOSPITAL_COMMUNITY)
Admission: RE | Disposition: A | Discharge status: Home / Self Care | Payer: Self-pay | Source: Home / Self Care | Attending: Vascular Surgery

## 2024-01-06 ENCOUNTER — Other Ambulatory Visit: Payer: Self-pay

## 2024-01-06 DIAGNOSIS — T82856A Stenosis of peripheral vascular stent, initial encounter: Secondary | ICD-10-CM | POA: Diagnosis present

## 2024-01-06 DIAGNOSIS — Z7982 Long term (current) use of aspirin: Secondary | ICD-10-CM | POA: Diagnosis not present

## 2024-01-06 DIAGNOSIS — I70223 Atherosclerosis of native arteries of extremities with rest pain, bilateral legs: Secondary | ICD-10-CM | POA: Diagnosis not present

## 2024-01-06 DIAGNOSIS — Z7902 Long term (current) use of antithrombotics/antiplatelets: Secondary | ICD-10-CM | POA: Diagnosis not present

## 2024-01-06 DIAGNOSIS — Z604 Social exclusion and rejection: Secondary | ICD-10-CM | POA: Insufficient documentation

## 2024-01-06 DIAGNOSIS — Z79899 Other long term (current) drug therapy: Secondary | ICD-10-CM | POA: Diagnosis not present

## 2024-01-06 DIAGNOSIS — I70212 Atherosclerosis of native arteries of extremities with intermittent claudication, left leg: Secondary | ICD-10-CM | POA: Diagnosis not present

## 2024-01-06 DIAGNOSIS — Y831 Surgical operation with implant of artificial internal device as the cause of abnormal reaction of the patient, or of later complication, without mention of misadventure at the time of the procedure: Secondary | ICD-10-CM | POA: Insufficient documentation

## 2024-01-06 DIAGNOSIS — F17290 Nicotine dependence, other tobacco product, uncomplicated: Secondary | ICD-10-CM | POA: Diagnosis not present

## 2024-01-06 DIAGNOSIS — Z59868 Other specified financial insecurity: Secondary | ICD-10-CM | POA: Insufficient documentation

## 2024-01-06 DIAGNOSIS — Z95828 Presence of other vascular implants and grafts: Secondary | ICD-10-CM | POA: Diagnosis not present

## 2024-01-06 DIAGNOSIS — F17210 Nicotine dependence, cigarettes, uncomplicated: Secondary | ICD-10-CM | POA: Diagnosis not present

## 2024-01-06 DIAGNOSIS — Z91411 Personal history of adult psychological abuse: Secondary | ICD-10-CM | POA: Diagnosis not present

## 2024-01-06 DIAGNOSIS — I771 Stricture of artery: Secondary | ICD-10-CM

## 2024-01-06 HISTORY — PX: LOWER EXTREMITY ANGIOGRAPHY: CATH118251

## 2024-01-06 HISTORY — PX: LOWER EXTREMITY INTERVENTION: CATH118252

## 2024-01-06 HISTORY — PX: ABDOMINAL AORTOGRAM W/LOWER EXTREMITY: CATH118223

## 2024-01-06 LAB — POCT I-STAT, CHEM 8
BUN: 8 mg/dL (ref 6–20)
Calcium, Ion: 1.05 mmol/L — ABNORMAL LOW (ref 1.15–1.40)
Chloride: 110 mmol/L (ref 98–111)
Creatinine, Ser: 0.6 mg/dL (ref 0.44–1.00)
Glucose, Bld: 90 mg/dL (ref 70–99)
HCT: 39 % (ref 36.0–46.0)
Hemoglobin: 13.3 g/dL (ref 12.0–15.0)
Potassium: 3.9 mmol/L (ref 3.5–5.1)
Sodium: 140 mmol/L (ref 135–145)
TCO2: 19 mmol/L — ABNORMAL LOW (ref 22–32)

## 2024-01-06 SURGERY — ABDOMINAL AORTOGRAM W/LOWER EXTREMITY
Anesthesia: LOCAL

## 2024-01-06 MED ORDER — BIVALIRUDIN TRIFLUOROACETATE 250 MG IV SOLR
INTRAVENOUS | Status: AC
  Filled 2024-01-06: qty 250

## 2024-01-06 MED ORDER — SODIUM CHLORIDE 0.9 % IV SOLN
INTRAVENOUS | Status: DC | PRN
  Administered 2024-01-06: 1.75 mg/kg/h via INTRAVENOUS

## 2024-01-06 MED ORDER — ONDANSETRON HCL 4 MG/2ML IJ SOLN: 4.0000 mg | Freq: Four times a day (QID) | INTRAMUSCULAR | Status: DC | PRN

## 2024-01-06 MED ORDER — BIVALIRUDIN BOLUS VIA INFUSION - CUPID
INTRAVENOUS | Status: DC | PRN
  Administered 2024-01-06: 46.275 mg via INTRAVENOUS

## 2024-01-06 MED ORDER — ASPIRIN 81 MG PO TBEC: 81.0000 mg | DELAYED_RELEASE_TABLET | Freq: Every day | ORAL | Status: DC

## 2024-01-06 MED ORDER — SODIUM CHLORIDE 0.9 % IV SOLN: INTRAVENOUS | Status: DC

## 2024-01-06 MED ORDER — ASPIRIN 81 MG PO CHEW
CHEWABLE_TABLET | ORAL | Status: DC | PRN
  Administered 2024-01-06: 81 mg via ORAL

## 2024-01-06 MED ORDER — SODIUM CHLORIDE 0.9 % IV SOLN: 250.0000 mL | INTRAVENOUS | Status: DC | PRN

## 2024-01-06 MED ORDER — SODIUM CHLORIDE 0.9 % IV SOLN
INTRAVENOUS | Status: DC | PRN
  Administered 2024-01-06: 1000 mL via INTRA_ARTERIAL
  Administered 2024-01-06: 250 mL via INTRA_ARTERIAL

## 2024-01-06 MED ORDER — SODIUM CHLORIDE 0.9% FLUSH: 3.0000 mL | Freq: Two times a day (BID) | INTRAVENOUS | Status: DC

## 2024-01-06 MED ORDER — CLOPIDOGREL BISULFATE 75 MG PO TABS
ORAL_TABLET | ORAL | Status: AC
  Filled 2024-01-06: qty 1

## 2024-01-06 MED ORDER — FENTANYL CITRATE (PF) 100 MCG/2ML IJ SOLN
INTRAMUSCULAR | Status: DC | PRN
  Administered 2024-01-06 (×2): 25 ug via INTRAVENOUS

## 2024-01-06 MED ORDER — LABETALOL HCL 5 MG/ML IV SOLN: 10.0000 mg | INTRAVENOUS | Status: DC | PRN

## 2024-01-06 MED ORDER — FENTANYL CITRATE (PF) 100 MCG/2ML IJ SOLN
INTRAMUSCULAR | Status: AC
  Filled 2024-01-06: qty 2

## 2024-01-06 MED ORDER — HYDRALAZINE HCL 20 MG/ML IJ SOLN: 5.0000 mg | INTRAMUSCULAR | Status: DC | PRN

## 2024-01-06 MED ORDER — ACETAMINOPHEN 325 MG PO TABS: 650.0000 mg | ORAL_TABLET | ORAL | Status: DC | PRN

## 2024-01-06 MED ORDER — LIDOCAINE HCL (PF) 1 % IJ SOLN
INTRAMUSCULAR | Status: AC
  Filled 2024-01-06: qty 30

## 2024-01-06 MED ORDER — OXYCODONE HCL 5 MG PO TABS: 5.0000 mg | ORAL_TABLET | ORAL | Status: DC | PRN

## 2024-01-06 MED ORDER — SODIUM CHLORIDE 0.9% FLUSH: 3.0000 mL | INTRAVENOUS | Status: DC | PRN

## 2024-01-06 MED ORDER — LIDOCAINE HCL (PF) 1 % IJ SOLN
INTRAMUSCULAR | Status: DC | PRN
  Administered 2024-01-06 (×2): 15 mL

## 2024-01-06 MED ORDER — MIDAZOLAM HCL (PF) 2 MG/2ML IJ SOLN
INTRAMUSCULAR | Status: DC | PRN
  Administered 2024-01-06 (×2): 1 mg via INTRAVENOUS

## 2024-01-06 MED ORDER — CLOPIDOGREL BISULFATE 300 MG PO TABS
ORAL_TABLET | ORAL | Status: DC | PRN
  Administered 2024-01-06: 75 mg via ORAL

## 2024-01-06 MED ORDER — CLOPIDOGREL BISULFATE 75 MG PO TABS: 75.0000 mg | ORAL_TABLET | Freq: Every day | ORAL | Status: DC

## 2024-01-06 MED ORDER — ASPIRIN 81 MG PO CHEW
CHEWABLE_TABLET | ORAL | Status: AC
  Filled 2024-01-06: qty 1

## 2024-01-06 MED ORDER — MIDAZOLAM HCL 2 MG/2ML IJ SOLN
INTRAMUSCULAR | Status: AC
  Filled 2024-01-06: qty 2

## 2024-01-06 SURGICAL SUPPLY — 16 items
CATH BEACON 5 .035 65 KMP TIP (CATHETERS) IMPLANT
CATH OMNI FLUSH 5F 65CM (CATHETERS) IMPLANT
DCB RANGER 7.0X40 135 (BALLOONS) IMPLANT
DCB RANGER 7.0X60 135 (BALLOONS) IMPLANT
DEVICE CLOSURE MYNXGRIP 6/7F (Vascular Products) IMPLANT
KIT ENCORE 26 ADVANTAGE (KITS) IMPLANT
KIT MICROPUNCTURE NIT STIFF (SHEATH) IMPLANT
KIT SINGLE USE MANIFOLD (KITS) IMPLANT
PACK CARDIAC CATHETERIZATION (CUSTOM PROCEDURE TRAY) IMPLANT
SET ATX-X65L (MISCELLANEOUS) IMPLANT
SHEATH PINNACLE 5F 10CM (SHEATH) IMPLANT
SHEATH PINNACLE 6F 10CM (SHEATH) IMPLANT
SHEATH PROBE COVER 6X72 (BAG) IMPLANT
STENT VIABAHN 7X29 6FR 80 (Permanent Stent) IMPLANT
WIRE BENTSON .035X145CM (WIRE) IMPLANT
WIRE G V18X300CM (WIRE) IMPLANT

## 2024-01-06 NOTE — Progress Notes (Signed)
 Patient requesting to go to the bathroom before the bedrest time was over.Educated the patient multiple times on staying in the bed  until bedrest over. Explained the risk of getting up before the scheduled time. Patient stated that Grace Castillo understood and unhooked herself from the blood pressure monitor and pulse oximeter and walked down the hall to the bathroom. Assessed bilateral groin sites when the patient returned from the bathroom. Dressings clean, dry, and intact. No hematoma or bleeding noted to the sites.
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
## 2024-01-06 NOTE — Op Note (Signed)
 Patient name: Grace Castillo MRN: [id number] DOB: 1980-02-20 Sex: female  01/06/2024 Pre-operative Diagnosis: High-grade recurrent in-stent bilateral common iliac artery stenosis with disabling claudication Post-operative diagnosis:  Same Surgeon:  Lonni DOROTHA Gaskins, MD Procedure Performed: 1.  Ultrasound-guided access right common femoral artery 2.  Aortogram with catheter selection of aorta 3.  Ultrasound-guided access left common femoral artery 4.  Angioplasty stent left distal common iliac artery (7 mm x 29 mm VBX) 5.  Drug-coated balloon angioplasty bilateral common iliac artery stents with kissing technique (7 mm x 60 mm drug-coated Ranger right common iliac artery and 7 mm x 40 mm drug-coated Ranger left common iliac artery) 6.  Bilateral lower extremity arteriogram 7.  Mynx closure bilateral common femoral arteries 8.  53 minutes of monitored moderate conscious sedation time  Indications: 43 year old female that has undergone previous bilateral common iliac artery stenting with VBX stents for disabling claudication.  Now with evidence of recurrent symptoms with a high-grade stenosis in both stents by duplex.  She presents for aortogram, lower extremity arteriogram, possible invention after risk benefits discussed.  Findings:   Ultrasound-guided access right common femoral artery.  Aortogram showed patent infrarenal aorta.  Bilateral common iliac stents had haziness consistent with high grade in-stent stenosis as demonstrated on duplex more notable in the proximal stents bilaterally.  Distally in the left common iliac artery there appeared to be some thrombus.  Ultimately we got left common femoral artery access.  We extended on the left to cover the suspected thrombus with a 7 mm x 29 mm VBX.  I then performed kissing drug-coated balloon angioplasty of both iliac stents into the distal aorta across the proximal ostium with 7 mm drug-coated Ranger's.  Widely patent stents at  completion.  Bilateral lower extremity angiogram showed preserved runoff in both lower extremities.   Procedure:  The patient was identified in the holding area and taken to room 8.  The patient was then placed supine on the table and prepped and draped in the usual sterile fashion.  A time out was called.  Patient received Versed  and fentanyl  for conscious moderate sedation.  Vital signs were monitored including heart rate, respiratory rate, oxygenation and blood pressure.  I was present for all of moderate sedation.  Ultrasound was used to evaluate the right common femoral artery.  It was patent .  A digital ultrasound image was acquired.  A micropuncture needle was used to access the right common femoral artery under ultrasound guidance.  An 018 wire was advanced without resistance and a micropuncture sheath was placed.  The 018 wire was removed and a benson wire was placed.  The micropuncture sheath was exchanged for a 5 french sheath.  An omniflush catheter was advanced over the wire to the level of L-1.  An abdominal angiogram was obtained.  Elected for bilateral groin access so we could KISS drug-coated balloons into the existing iliac stents.  I then evaluated the left common femoral artery with ultrasound, it was patent, an image was saved.  This was accessed with micro access needle, placed a microwire, and micro sheath then used a Bentson wire exchanged for short 6 French sheath in the left groin.  Patient was then started on Angiomax  bolus with drip and we checked a ACT to ensure this was therapeutic after 5 minutes.  I then upsized to a 6 French sheath in the right groin using the Bentson wire.  I then after getting better aortogram images noted some haziness in  the left common iliac artery distal to the existing stent concerning for thrombus.  We elected to extend on the left with a 7 mm x 29 mm VBX after marking the hypogastric.  I then exchanged for V18 wires through both iliac stents with plans to  kiss drug-coated balloon in bilateral iliac artery stents.  We selected a 7 mm x 60 mm drug-coated balloon Ranger on the right and a 7 mm x 40 mm drug-coated Ranger on the left and these were then kissed into the distal abdominal aorta through the ostium of both VBX stents and inflated to nominal pressure.  Much better results with excellent flow through the stents.  No appreciable significant stenosis.  I did a pullback pressure on the left that was not significant.  We then got bilateral lower extremity runoff to ensure there was no distal embolization.  This showed patent SFA popliteal arteries with three-vessel runoff on both sides.  Mynx closures were placed in both groins after we put a short 6 French sheaths.  Taken to holding in stable condition.  Plan: Excellent results today.  Continue aspirin  statin Plavix .  Follow-up in 1 month with aortoiliac duplex.  Lonni DOROTHA Gaskins, MD [facility] [place] Office: [phone]

## 2024-01-06 NOTE — Progress Notes (Signed)
 Offered patient a tray when she returned from her procedure. Patient declined meal and a drink stating that her daughter was bringing her lunch. Spoke with the patient she stated that she needed to eat. I informed her that I offered previously and she declined since her daughter was bringing her food. Patient requested meal tray at this time and tray was provided.

## 2024-01-06 NOTE — Discharge Instructions (Signed)
 Femoral Site Care This sheet gives you information about how to care for yourself after your procedure. Your health care provider may also give you more specific instructions. If you have problems or questions, contact your health care provider. What can I expect after the procedure?  After the procedure, it is common to have: Bruising that usually fades within 1-2 weeks. Tenderness at the site. Follow these instructions at home: Wound care Follow instructions from your health care provider about how to take care of your insertion site. Make sure you: Wash your hands with soap and water  before you change your bandage (dressing). If soap and water  are not available, use hand sanitizer. Remove your dressing as told by your health care provider. 24 hours Do not take baths, swim, or use a hot tub until your health care provider approves. You may shower 24-48 hours after the procedure or as told by your health care provider. Gently wash the site with plain soap and water . Pat the area dry with a clean towel. Do not rub the site. This may cause bleeding. Do not apply powder or lotion to the site. Keep the site clean and dry. Check your femoral site every day for signs of infection. Check for: Redness, swelling, or pain. Fluid or blood. Warmth. Pus or a bad smell. Activity For the first 2-3 days after your procedure, or as long as directed: Avoid climbing stairs as much as possible. Do not squat. Do not lift anything that is heavier than 10 lb (4.5 kg), or the limit that you are told, until your health care provider says that it is safe. For 5 days Rest as directed. Avoid sitting for a long time without moving. Get up to take short walks every 1-2 hours. Do not drive for 24 hours if you were given a medicine to help you relax (sedative). General instructions Take over-the-counter and prescription medicines only as told by your health care provider. Keep all follow-up visits as told by your  health care provider. This is important. Contact a health care provider if you have: A fever or chills. You have redness, swelling, or pain around your insertion site. Get help right away if: The catheter insertion area swells very fast. You pass out. You suddenly start to sweat or your skin gets clammy. The catheter insertion area is bleeding, and the bleeding does not stop when you hold steady pressure on the area. The area near or just beyond the catheter insertion site becomes pale, cool, tingly, or numb. These symptoms may represent a serious problem that is an emergency. Do not wait to see if the symptoms will go away. Get medical help right away. Call your local emergency services (911 in the [place] Do not drive yourself to the hospital. Summary After the procedure, it is common to have bruising that usually fades within 1-2 weeks. Check your femoral site every day for signs of infection. Do not lift anything that is heavier than 10 lb (4.5 kg), or the limit that you are told, until your health care provider says that it is safe. This information is not intended to replace advice given to you by your health care provider. Make sure you discuss any questions you have with your health care provider. Document Revised: 02/15/2017 Document Reviewed: 02/15/2017 Elsevier Patient Education  2020 [employer].

## 2024-01-07 ENCOUNTER — Encounter (HOSPITAL_COMMUNITY): Payer: Self-pay | Admitting: Vascular Surgery

## 2024-01-07 LAB — POCT ACTIVATED CLOTTING TIME: Activated Clotting Time: 297 s

## 2024-01-12 ENCOUNTER — Other Ambulatory Visit: Payer: Self-pay

## 2024-01-12 DIAGNOSIS — I739 Peripheral vascular disease, unspecified: Secondary | ICD-10-CM

## 2024-01-27 ENCOUNTER — Encounter (HOSPITAL_BASED_OUTPATIENT_CLINIC_OR_DEPARTMENT_OTHER): Payer: Self-pay

## 2024-01-28 ENCOUNTER — Other Ambulatory Visit (HOSPITAL_BASED_OUTPATIENT_CLINIC_OR_DEPARTMENT_OTHER): Payer: Self-pay

## 2024-01-28 ENCOUNTER — Encounter (HOSPITAL_BASED_OUTPATIENT_CLINIC_OR_DEPARTMENT_OTHER): Payer: Self-pay | Admitting: Cardiology

## 2024-01-28 ENCOUNTER — Ambulatory Visit (HOSPITAL_BASED_OUTPATIENT_CLINIC_OR_DEPARTMENT_OTHER): Admitting: Cardiology

## 2024-01-28 VITALS — BP 122/74 | HR 69 | Ht 62.5 in | Wt 135.0 lb

## 2024-01-28 DIAGNOSIS — Z720 Tobacco use: Secondary | ICD-10-CM

## 2024-01-28 DIAGNOSIS — I771 Stricture of artery: Secondary | ICD-10-CM | POA: Diagnosis not present

## 2024-01-28 DIAGNOSIS — I739 Peripheral vascular disease, unspecified: Secondary | ICD-10-CM

## 2024-01-28 DIAGNOSIS — I25118 Atherosclerotic heart disease of native coronary artery with other forms of angina pectoris: Secondary | ICD-10-CM

## 2024-01-28 DIAGNOSIS — E785 Hyperlipidemia, unspecified: Secondary | ICD-10-CM | POA: Diagnosis not present

## 2024-01-28 MED ORDER — REPATHA SURECLICK 140 MG/ML ~~LOC~~ SOAJ
140.0000 mg | SUBCUTANEOUS | 3 refills | Status: AC
  Filled 2024-01-28 (×2): qty 6, 84d supply, fill #0

## 2024-01-28 NOTE — Patient Instructions (Signed)
 Medication Instructions:   Your physician recommends that you continue on your current medications as directed. Please refer to the Current Medication list given to you today.  Please pick up your Repatha  downstairs.   *If you need a refill on your cardiac medications before your next appointment, please call your pharmacy*  Lab Work:  None ordered.  If you have labs (blood work) drawn today and your tests are completely normal, you will receive your results only by: MyChart Message (if you have MyChart) OR A paper copy in the mail If you have any lab test that is abnormal or we need to change your treatment, we will call you to review the results.  Testing/Procedures:  None ordered.  Follow-Up: At [facility], you and your health needs are our priority.  As part of our continuing mission to provide you with exceptional heart care, our providers are all part of one team.  This team includes your primary Cardiologist (physician) and Advanced Practice Providers or APPs (Physician Assistants and Nurse Practitioners) who all work together to provide you with the care you need, when you need it.  Your next appointment:   1 year(s)  Provider:   Oneil Parchment, MD    We recommend signing up for the patient portal called MyChart.  Sign up information is provided on this After Visit Summary.  MyChart is used to connect with patients for Virtual Visits (Telemedicine).  Patients are able to view lab/test results, encounter notes, upcoming appointments, etc.  Non-urgent messages can be sent to your provider as well.   To learn more about what you can do with MyChart, go to [email or web address].   Other Instructions  Your physician wants you to follow-up in: 1 year. You will receive a reminder letter in the mail two months in advance. If you don't receive a letter, please call our office to schedule the follow-up appointment.

## 2024-01-28 NOTE — Progress Notes (Signed)
 Cardiology Office Note:  .   Date:  01/28/2024  ID:  Grace Castillo, DOB [birth year], MRN [id number] PCP: Vicci Barnie NOVAK, MD  [facility] Providers Cardiologist:  Oneil Parchment, MD     History of Present Illness: .   Grace Castillo is a 43 y.o. female Discussed the use of AI scribe  History of Present Illness Grace Castillo is a 43 year old female with coronary artery disease and peripheral arterial disease who presents for follow-up.  She has a history of coronary artery disease and STEMI, with stenting of the LAD, circumflex, and bifurcation of the distal left main in May 2021. She also has a history of ventricular tachycardia. She experiences shortness of breath and chest pains. She is currently on aspirin , Plavix , atorvastatin , and Repatha . Her LDL was 18 on Repatha , but she has not been able to take the Repatha  shots recently due to cost issues, despite having insurance. Her LDL was 57 in September, likely on atorvastatin  alone.  She has peripheral arterial disease with a history of stenting of both iliac arteries due to subtotal occlusion of the proximal right common iliac artery. Three weeks ago, she underwent another peripheral vascular procedure with stenting of the distal common left iliac artery. During the procedure, one of the arteries burst, which required intervention.  She follows with neurology for multiple sclerosis and has contacted an attorney to explore disability options due to her health issues. She is working out with a psychologist, educational.      ROS: Anxious   Studies Reviewed: .        Results LABS LDL: 57 (10/2023)  RADIOLOGY Cardiac PET scan: Low risk with no ischemia (05/2022)  DIAGNOSTIC Peripheral vascular procedure: Stent to distal common left iliac artery, 7x29 mm, with minks closure (01/06/2024) Risk Assessment/Calculations:            Physical Exam:   VS:  BP 122/74 (BP Location: Left Arm, Patient Position: Sitting, Cuff Size: Normal)   Pulse 69    Ht 5' 2.5 (1.588 m)   Wt 135 lb (61.2 kg)   LMP 12/30/2023   SpO2 98%   BMI 24.30 kg/m    Wt Readings from Last 3 Encounters:  01/28/24 135 lb (61.2 kg)  01/06/24 136 lb (61.7 kg)  12/30/23 136 lb 1.6 oz (61.7 kg)    GEN: Well nourished, well developed in no acute distress NECK: No JVD; No carotid bruits CARDIAC: RRR, no murmurs, no rubs, no gallops RESPIRATORY:  Clear to auscultation without rales, wheezing or rhonchi  ABDOMEN: Soft, non-tender, non-distended EXTREMITIES:  No edema; No deformity   ASSESSMENT AND PLAN: .    Assessment and Plan Assessment & Plan Coronary artery disease with prior STEMI and stents Coronary artery disease with prior STEMI and stents in the LAD, circumflex, and distal left main. Recent cardiac PET scan in April 2024 showed low risk with no ischemia. Reports occasional shortness of breath and chest pain, likely muscular rather than cardiac-related. - Continue aspirin  and Plavix . - Continue atorvastatin .  Peripheral arterial disease with recent iliac stenting Peripheral arterial disease with recent stenting in both iliacs. Recent procedure involved stenting of the distal common left iliac artery with a 7x29 mm stent and closure device. Reports difficulty with mobility due to leg issues. - Encouraged smoking cessation.  Hyperlipidemia with statin and PCSK9 inhibitor therapy Hyperlipidemia managed with atorvastatin  and Repatha . LDL was 57 in September, indicating good control. Reports difficulty affording Repatha  due to insurance and pharmacy issues. Previous  intolerance to Praluent . - Coordinated with pharmacy to facilitate Repatha  prescription and insurance coverage. - Continue atorvastatin .         Dispo: 1 yr  Signed, Oneil Parchment, MD

## 2024-02-15 ENCOUNTER — Ambulatory Visit (HOSPITAL_BASED_OUTPATIENT_CLINIC_OR_DEPARTMENT_OTHER)
Admission: RE | Admit: 2024-02-15 | Discharge: 2024-02-15 | Disposition: A | Discharge status: Home / Self Care | Source: Ambulatory Visit | Attending: Vascular Surgery | Admitting: Vascular Surgery

## 2024-02-15 ENCOUNTER — Ambulatory Visit: Admitting: Physician Assistant

## 2024-02-15 ENCOUNTER — Ambulatory Visit (HOSPITAL_COMMUNITY)
Admission: RE | Admit: 2024-02-15 | Discharge: 2024-02-15 | Disposition: A | Discharge status: Home / Self Care | Source: Ambulatory Visit | Attending: Vascular Surgery | Admitting: Vascular Surgery

## 2024-02-15 VITALS — BP 144/82 | HR 72 | Temp 98.2°F | Ht 62.5 in | Wt 136.0 lb

## 2024-02-15 DIAGNOSIS — M79605 Pain in left leg: Secondary | ICD-10-CM | POA: Insufficient documentation

## 2024-02-15 DIAGNOSIS — M79604 Pain in right leg: Secondary | ICD-10-CM | POA: Diagnosis not present

## 2024-02-15 DIAGNOSIS — I739 Peripheral vascular disease, unspecified: Secondary | ICD-10-CM | POA: Insufficient documentation

## 2024-02-15 DIAGNOSIS — I771 Stricture of artery: Secondary | ICD-10-CM | POA: Diagnosis not present

## 2024-02-15 LAB — VAS US ABI WITH/WO TBI
Left ABI: 1.12
Right ABI: 1.09
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
## 2024-02-16 ENCOUNTER — Encounter: Payer: Self-pay | Admitting: Physician Assistant

## 2024-02-21 ENCOUNTER — Ambulatory Visit (INDEPENDENT_AMBULATORY_CARE_PROVIDER_SITE_OTHER)

## 2024-02-21 VITALS — BP 129/85 | HR 97 | Temp 98.0°F | Resp 97 | Wt 136.0 lb

## 2024-02-21 DIAGNOSIS — H6121 Impacted cerumen, right ear: Secondary | ICD-10-CM

## 2024-02-21 DIAGNOSIS — H608X3 Other otitis externa, bilateral: Secondary | ICD-10-CM | POA: Diagnosis not present

## 2024-02-21 DIAGNOSIS — R040 Epistaxis: Secondary | ICD-10-CM | POA: Diagnosis not present

## 2024-02-21 DIAGNOSIS — F17200 Nicotine dependence, unspecified, uncomplicated: Secondary | ICD-10-CM

## 2024-02-21 MED ORDER — NICOTINE POLACRILEX 4 MG MT LOZG: LOZENGE | OROMUCOSAL | 0 refills | Status: AC

## 2024-02-21 MED ORDER — FLUOCINOLONE ACETONIDE 0.01 % OT OIL: 5.0000 [drp] | TOPICAL_OIL | Freq: Two times a day (BID) | OTIC | 0 refills | Status: AC

## 2024-02-21 NOTE — Progress Notes (Signed)
 Dear Dr. Vicci, Here is my assessment for our mutual patient, Grace Castillo. Thank you for allowing me the opportunity to care for your patient. Please do not hesitate to contact me should you have any other questions. Sincerely, Dr. Penne Croak  Otolaryngology Clinic Note Referring provider: Dr. Vicci HPI:  Discussed the use of AI scribe software for clinical note transcription with the patient, who gave verbal consent to proceed.  History of Present Illness Grace Castillo is a 44 year old female with recurrent epistaxis who presents for evaluation of a new left-sided epistaxis.  Epistaxis - Recurrent episodes of epistaxis, with a new left-sided episode occurring three days prior to presentation - Most recent episode was less severe than previous events - Recent episode associated with exposure to cold, dry air and missed applications of nasal ointment during increased activity while moving - Typically applies nasal ointment and saline spray in the morning and occasionally in the afternoon, but sometimes misses doses when away from home - Discontinued intranasal corticosteroids - Uses a humidifier nightly and recently acquired a new unit to improve nasal moisture  Upper respiratory symptoms - Persistent cough - Sensation of sinus pressure  Nicotine  dependence - Continues to smoke cigarettes, primarily in the morning with coffee and occasionally at night due to stress - Has significantly reduced overall consumption - Strong desire to quit - Previously tried nicotine  gum, patches, and oral medications; oral medication increased smoking - No prior use of nicotine  lozenges but willing to try  Aural symptoms - Intermittent pruritus and mild aural fullness - No hearing loss - No need for audiometric evaluation    Independent Review of Additional Tests or Records:  Reviewed external note from referring PCP, Johnson,describing relevant history incorporated into todays evaluation.     PMH/Meds/All/SocHx/FamHx/ROS:   Past Medical History:  Diagnosis Date   Hyperlipidemia    Hypertension    MS (multiple sclerosis)    NSTEMI (non-ST elevated myocardial infarction) (HCC)    NSVT (nonsustained ventricular tachycardia) (HCC) 07/02/2019   S/P angioplasty with stent    DES to LAD and Lcx      Past Surgical History:  Procedure Laterality Date   ABDOMINAL AORTOGRAM W/LOWER EXTREMITY N/A 07/10/2021   Procedure: ABDOMINAL AORTOGRAM W/LOWER EXTREMITY;  Surgeon: Gretta Lonni PARAS, MD;  Location: [facility];  Service: Cardiovascular;  Laterality: N/A;   ABDOMINAL AORTOGRAM W/LOWER EXTREMITY Bilateral 04/09/2022   Procedure: ABDOMINAL AORTOGRAM W/LOWER EXTREMITY;  Surgeon: Gretta Lonni PARAS, MD;  Location: [facility];  Service: Cardiovascular;  Laterality: Bilateral;   ABDOMINAL AORTOGRAM W/LOWER EXTREMITY N/A 01/06/2024   Procedure: ABDOMINAL AORTOGRAM W/LOWER EXTREMITY;  Surgeon: Gretta Lonni PARAS, MD;  Location: [facility];  Service: Cardiovascular;  Laterality: N/A;   CHOLECYSTECTOMY     CORONARY BALLOON ANGIOPLASTY N/A 07/03/2019   Procedure: CORONARY BALLOON ANGIOPLASTY;  Surgeon: Mady Lonni, MD;  Location: [facility];  Service: Cardiovascular;  Laterality: N/A;  distal lad   CORONARY STENT INTERVENTION N/A 07/03/2019   Procedure: CORONARY STENT INTERVENTION;  Surgeon: Mady Lonni, MD;  Location: [facility];  Service: Cardiovascular;  Laterality: N/A;  cfx lad    CORONARY ULTRASOUND/IVUS N/A 07/03/2019   Procedure: Intravascular Ultrasound/IVUS;  Surgeon: Mady Lonni, MD;  Location: [facility];  Service: Cardiovascular;  Laterality: N/A;   LEFT HEART CATH AND CORONARY ANGIOGRAPHY N/A 07/03/2019   Procedure: LEFT HEART CATH AND CORONARY ANGIOGRAPHY;  Surgeon: Mady Lonni, MD;  Location: [facility];  Service: Cardiovascular;  Laterality: N/A;   LOWER EXTREMITY ANGIOGRAPHY N/A 01/06/2024    Procedure: Lower Extremity Angiography;  Surgeon: Gretta Lonni PARAS, MD;  Location[facility];  Service: Cardiovascular;  Laterality: N/A;   LOWER EXTREMITY INTERVENTION Bilateral 01/06/2024   Procedure: LOWER EXTREMITY INTERVENTION;  Surgeon: Gretta Lonni PARAS, MD;  Location: [facility];  Service: Cardiovascular;  Laterality: Bilateral;   PERIPHERAL VASCULAR BALLOON ANGIOPLASTY Bilateral 04/09/2022   Procedure: PERIPHERAL VASCULAR BALLOON ANGIOPLASTY;  Surgeon: Gretta Lonni PARAS, MD;  Location: [facility];  Service: Cardiovascular;  Laterality: Bilateral;  left and right common iliac   PERIPHERAL VASCULAR INTERVENTION  07/10/2021   Procedure: PERIPHERAL VASCULAR INTERVENTION;  Surgeon: Gretta Lonni PARAS, MD;  Location: [facility];  Service: Cardiovascular;;  Bilateral Common Iliac Stents   PERIPHERAL VASCULAR INTERVENTION Right 04/09/2022   Procedure: PERIPHERAL VASCULAR INTERVENTION;  Surgeon: Gretta Lonni PARAS, MD;  Location: [facility] INVASIVE CV LAB;  Service: Cardiovascular;  Laterality: Right;  right common iliac   TUBAL LIGATION      Family History  Problem Relation Age of Onset   Sudden Cardiac Death Maternal Grandfather      Social Connections: Socially Isolated (09/14/2023)   Social Connection and Isolation Panel    Frequency of Communication with Friends and Family: More than three times a week    Frequency of Social Gatherings with Friends and Family: Once a week    Attends Religious Services: Never    Database Administrator or Organizations: No    Attends Engineer, Structural: Never    Marital Status: Never married     Current Medications[1]   Physical Exam:   BP 129/85 (BP Location: Right Arm, Patient Position: Sitting, Cuff Size: Normal)   Pulse 97   Temp 98 F (36.7 C)   Resp (!) 97   Wt 136 lb (61.7 kg)   BMI 24.48 kg/m   The patient was awake, alert, and appropriate. The external ears were inspected, and otoscopy  was performed to evaluate the external auditory canals and tympanic membranes. The nasal cavity and septum were examined for mucosal changes, obstruction, or discharge. The oral cavity and oropharynx were inspected for mucosal lesions, infection, or tonsillar hypertrophy. The neck was palpated for lymphadenopathy, thyroid abnormalities, or other masses. Cranial nerve function was grossly intact.  Pertinent Findings: General: Well developed, well nourished. No acute distress. Voice without hoarseness Head/Face: Normocephalic. No sinus tenderness. Facial nerve intact and equal bilaterally. No facial lacerations. Eyes: PERRL, no scleral icterus or conjunctival hemorrhage. EOMI. Ears: No gross deformity. Normal external canal. Tympanic membrane with normal landmarks bilaterally Hearing: Normal speech reception.  Nose: No gross deformity or lesions. No purulent discharge. No turbinate hypertrophy.  Mouth/Oropharynx: Lips without any lesions. Dentition fair. No mucosal lesions within the oropharynx. No tonsillar enlargement, exudate, or lesions. Pharyngeal walls symmetrical. Uvula midline. Tongue midline without lesions. Larynx: See TFL if applicable Nasopharynx: See TFL if applicable Neck: Trachea midline. No masses. No thyromegaly or nodules palpated. No crepitus. Lymphatic: No lymphadenopathy in the neck. Respiratory: No stridor or distress. Room air. Cardiovascular: Regular rate and rhythm. Extremities: No edema or cyanosis. Warm and well-perfused. Skin: No scars or lesions on face or neck. Neurologic: CN II-XII grossly intact. Moving all extremities without gross abnormality. Other:  Physical Exam HEENT: Nose with mild bleeding, not severe. Oral cavity normal. Ears with minimal cerumen, no obstruction.  Seprately Identifiable Procedures:  I personally ordered, reviewed and interpreted the following  with the patient today  Procedure:  cerumen removal using microscope (CPT [phone]) - Mod  25 Pre-procedure diagnosis: Cerumen impaction right external ear/ears Post-procedure diagnosis: same Indication: right cerumen impaction; given patient's otologic complaints and history as well as for improved and comprehensive examination of external ear and tympanic membrane, bilateral otologic examination using microscope was performed and impacted cerumen removed  Procedure: Patient was placed semi-recumbent. BCerumen removed on right using  currette with improvement in EAC examination and patency. Left: EAC was patent. TM was intact . Middle ear was aerated. Drainage: absent Right: EAC was patent. TM was intact . Middle ear was aerated . Drainage: absent Patient tolerated the procedure well.   Impression & Plans:  Grace Castillo is a 44 y.o. female  1. Recurrent epistaxis   2. Tobacco use disorder   3. Hearing loss due to cerumen impaction, right   4. Chronic eczematous otitis externa of both ears    - Findings and diagnoses discussed in detail with the patient. - Risks, benefits, and alternatives were reviewed. Through shared decision making, the patient elects to proceed with below. Assessment & Plan Recurrent epistaxis Mild recurrent epistaxis, likely due to missed nasal ointment, cold dry air, and increased activity. No severe bleeding. Discontinuation of intranasal corticosteroids appropriate. Resolution expected with adherence to topical therapy and environmental changes. Declined in-office chemical cauterization. - Apply nasal ointment twice daily for 4-5 days. - Increase use of saline nasal spray throughout the day. - Use humidifier nightly. - Repeat regimen for 5 days with future episodes. - Return if epistaxis worsens or increases in frequency.  Tobacco use disorder Continues to smoke, primarily in the morning and occasionally at night due to stress. Reduced overall use and motivated to quit. Previous nicotine  replacement therapies unsuccessful. Willing to try nicotine   lozenges. Smoking cessation expected to improve health and vascular status. - Prescribed nicotine  lozenges (4 mg) for cravings, particularly in the morning and as needed. - Provided education on lozenge use, dosing, and expectations. - Encouraged continued smoking cessation efforts and reinforced health benefits.  Cerumen impaction with pruritus Intermittent aural pruritus and mild clogging sensation. Small cerumen fragment removed without complication. No significant impaction or hearing loss. Pruritus likely related to minor cerumen or irritation. - Removed cerumen fragment from affected ear. - Sent prescription for pruritus if symptoms persist.  Given the patient's tobacco use, I also discussed cessation and options for cessation, including counseling. Counseled patient on the dangers of tobacco use, advised patient to stop smoking, and reviewed strategies to maximize success. We reviewed treatment options to assist him quit smoking including NRT, Chantix , and Bupropion . Patient is  ready to quit, and declined further treatment. Total time spent with this was 4 minutes.   CPT 99406 (4-10 mins) - Orders placed: No orders of the defined types were placed in this encounter.  - Medications prescribed/continued/adjusted:  Meds ordered this encounter  Medications   nicotine  polacrilex (COMMIT) 4 MG lozenge    Sig: RX #3 Weeks 10-12: 1 lozenge every 4-8 hours.Max 20 lozenges per day.    Dispense:  126 lozenge    Refill:  0   Fluocinolone  Acetonide 0.01 % OIL    Sig: Place 5 drops in ear(s) in the morning and at bedtime. Use for 5 days then, as needed    Dispense:  20 mL    Refill:  0   - Education materials provided to the patient. - Follow up: 1 year for FFL and Nasal endo, Return sooner for epistaxis. SABRA  Patient instructed to return sooner or go to the ED if new/worsening symptoms develop.  Thank you for allowing me the opportunity to care for your patient. Please do not hesitate to  contact me should you have any other questions.  Sincerely, Penne Croak, DO Otolaryngologist (ENT) [facility] Phone: [phone] Fax: [phone]  02/21/2024, 10:53 AM        [1]  Current Outpatient Medications:    albuterol  (VENTOLIN  HFA) 108 (90 Base) MCG/ACT inhaler, Inhale 2 puffs into the lungs every 6 (six) hours as needed for wheezing or shortness of breath., Disp: 8.5 g, Rfl: 2   aspirin  EC (ASPIRIN  LOW DOSE) 81 MG tablet, TAKE 1 TABLET BY MOUTH EVERY DAY, Disp: 90 tablet, Rfl: 0   atorvastatin  (LIPITOR ) 80 MG tablet, Take 1 tablet (80 mg total) by mouth daily., Disp: 90 tablet, Rfl: 3   benzonatate  (TESSALON  PERLES) 100 MG capsule, Take 1 capsule (100 mg total) by mouth 3 (three) times daily as needed., Disp: 20 capsule, Rfl: 0   clopidogrel  (PLAVIX ) 75 MG tablet, Take 1 tablet (75 mg total) by mouth daily., Disp: 90 tablet, Rfl: 3   Evolocumab  (REPATHA  SURECLICK) 140 MG/ML SOAJ, Inject 140 mg into the skin every 14 (fourteen) days., Disp: 6 mL, Rfl: 3   famotidine  (PEPCID ) 20 MG tablet, Take 1 tablet (20 mg total) by mouth 2 (two) times daily., Disp: 180 tablet, Rfl: 1   Fluocinolone  Acetonide 0.01 % OIL, Place 5 drops in ear(s) in the morning and at bedtime. Use for 5 days then, as needed, Disp: 20 mL, Rfl: 0   mupirocin  ointment (BACTROBAN ) 2 %, Apply 1 Application topically 2 (two) times daily. Apply small amount inside nose on both sides for 10 days (Patient taking differently: Apply 1 Application topically 3 (three) times daily. Apply small amount inside nose on both sides for 10 days), Disp: 22 g, Rfl: 0   nicotine  polacrilex (COMMIT) 4 MG lozenge, RX #3 Weeks 10-12: 1 lozenge every 4-8 hours.Max 20 lozenges per day., Disp: 126 lozenge, Rfl: 0   nitroGLYCERIN  (NITROSTAT ) 0.4 MG SL tablet, Place 1 tablet (0.4 mg total) under the tongue every 5 (five) minutes as needed for chest pain., Disp: 25 tablet, Rfl: 5   sodium chloride  (OCEAN) 0.65 % SOLN nasal  spray, Place 1 spray into both nostrils in the morning, at noon, in the evening, and at bedtime., Disp: 88 mL, Rfl: 0   topiramate  (TOPAMAX ) 50 MG tablet, Take 1 tablet in the AM and 2 tablets at bedtime. (Patient taking differently: Take 50-100 mg by mouth 2 (two) times daily. Take 50 mg (1 tablet) in the morning and 100 mg (2 tablets) at night for a total daily dose of 150 mg.), Disp: 90 tablet, Rfl: 5   Ubrogepant  (UBRELVY ) 100 MG TABS, Take 1 tablet at the onset of migraine. Can repeat in 2 hours if needed.  Not to exceed 2 tablets in 24 hours, Disp: 12 tablet, Rfl: 3

## 2024-02-23 ENCOUNTER — Other Ambulatory Visit: Payer: Self-pay

## 2024-02-23 DIAGNOSIS — I739 Peripheral vascular disease, unspecified: Secondary | ICD-10-CM

## 2024-03-03 ENCOUNTER — Ambulatory Visit: Payer: Self-pay | Admitting: Internal Medicine

## 2024-03-03 ENCOUNTER — Encounter: Payer: Self-pay | Admitting: Internal Medicine

## 2024-03-03 VITALS — BP 122/77 | HR 94 | Temp 98.1°F | Ht 62.0 in | Wt 132.0 lb

## 2024-03-03 DIAGNOSIS — G8929 Other chronic pain: Secondary | ICD-10-CM

## 2024-03-03 DIAGNOSIS — I739 Peripheral vascular disease, unspecified: Secondary | ICD-10-CM

## 2024-03-03 DIAGNOSIS — M25551 Pain in right hip: Secondary | ICD-10-CM

## 2024-03-03 DIAGNOSIS — F17200 Nicotine dependence, unspecified, uncomplicated: Secondary | ICD-10-CM

## 2024-03-03 DIAGNOSIS — F411 Generalized anxiety disorder: Secondary | ICD-10-CM

## 2024-03-03 MED ORDER — DULOXETINE HCL 20 MG PO CPEP: 20.0000 mg | ORAL_CAPSULE | Freq: Every day | ORAL | 3 refills | Status: AC
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
## 2024-03-03 NOTE — Patient Instructions (Signed)
" °  VISIT SUMMARY: During your visit, we discussed your ongoing health issues, including your right hip pain, peripheral artery disease, coronary artery disease, nicotine  dependence, and anxiety. We reviewed your recent procedure and current symptoms, and we have developed a plan to address each of these concerns.  YOUR PLAN: -RIGHT HIP OSTEOARTHRITIS: Right hip osteoarthritis is a condition where the cartilage in the hip joint wears down over time, causing pain and stiffness. We have ordered an x-ray of your right hip and may consider an MRI based on the results. You have been referred to orthopedics for further evaluation. We also discussed the potential use of Cymbalta  to help manage your chronic pain and anxiety.  -PERIPHERAL ARTERY DISEASE, STATUS POST BILATERAL ILIAC STENTING: Peripheral artery disease is a condition where the arteries in your limbs are narrowed, reducing blood flow. After your stenting procedure, your circulation has improved, but you still experience numbness and poor circulation. It is important to stop smoking to improve your circulation and prevent further complications.  -NICOTINE  DEPENDENCE: Nicotine  dependence is an addiction to tobacco products. You are making efforts to quit smoking, and we encourage you to continue using nicotine  lozenges and cough drops to help with this. We support your smoking cessation efforts.  -ANXIETY DISORDER: Anxiety disorder is a mental health condition characterized by excessive worry and fear. Your anxiety is related to your health issues, and we discussed the potential use of Cymbalta  to help manage both your anxiety and chronic pain.  INSTRUCTIONS: Please follow up with the x-ray of your right hip at [facility]. Based on the results, we may consider an MRI. You have been referred to orthopedics for further evaluation. Continue taking your current medications for coronary artery disease and use nicotine  lozenges and cough drops to  help with smoking cessation. We will discuss the potential use of Cymbalta  at your next visit.    Contains text generated by [employer].   "

## 2024-03-07 ENCOUNTER — Other Ambulatory Visit

## 2024-03-14 ENCOUNTER — Ambulatory Visit: Admitting: Orthopaedic Surgery

## 2024-03-17 ENCOUNTER — Ambulatory Visit: Admitting: Physician Assistant

## 2024-03-17 ENCOUNTER — Other Ambulatory Visit (INDEPENDENT_AMBULATORY_CARE_PROVIDER_SITE_OTHER)

## 2024-03-17 DIAGNOSIS — M25551 Pain in right hip: Secondary | ICD-10-CM

## 2024-03-17 MED ORDER — LIDOCAINE HCL 1 % IJ SOLN
5.0000 mL | INTRAMUSCULAR | Status: AC | PRN
  Administered 2024-03-17: 5 mL

## 2024-03-17 MED ORDER — METHYLPREDNISOLONE ACETATE 40 MG/ML IJ SUSP
40.0000 mg | INTRAMUSCULAR | Status: AC | PRN
  Administered 2024-03-17: 40 mg via INTRA_ARTICULAR

## 2024-03-17 NOTE — Progress Notes (Signed)
 "  Office Visit Note   Patient: Grace Castillo           Date of Birth: 1980/12/17           MRN: [id number] Visit Date: 03/17/2024              Requested by: Vicci Barnie NOVAK, MD [address] [place],  [place] [address] PCP: Vicci Barnie NOVAK, MD   Assessment & Plan: Visit Diagnoses:  1. Pain of right hip     Plan: Patient is a 44 year old woman with multiple medical issues.  She has today coming with chronic right hip pain now has been going on for few years no particular injury.  She did have x-rays a couple years ago and once today that were very minimal degenerative changes if any.  She has had an injection into her lumbar spine thinking that this was the issue and again this did not help her.  Today on exam findings are most consistent with a trochanteric bursitis with some popping probably secondary to inflammation.  She is willing to try an injection today.  She is going to give this about 5 or 6 days.  Hopefully will help if it does not help I would order an MRI  Follow-Up Instructions: No follow-ups on file.   Orders:  Orders Placed This Encounter  Procedures   Large Joint Inj: L greater trochanter   XR HIP UNILAT W OR W/O PELVIS 2-3 VIEWS RIGHT   No orders of the defined types were placed in this encounter.     Procedures: Large Joint Inj: R greater trochanter on 03/17/2024 8:58 AM Indications: pain and diagnostic evaluation Details: 22 G 1.5 in needle, lateral approach  Arthrogram: No  Medications: 5 mL lidocaine  1 %; 40 mg methylPREDNISolone  acetate 40 MG/ML Outcome: tolerated well, no immediate complications Procedure, treatment alternatives, risks and benefits explained, specific risks discussed. Consent was given by the patient.       Clinical Data: No additional findings.   Subjective: Chief Complaint  Patient presents with   Right Hip - Pain    HPI pleasant 44 year old woman comes in today with a chronic history of right hip pain.   Focuses the pain to the outside of her hip.  She denies any groin pain.  She denies any back pain.  She does get some paresthesias periodically that go down her leg.  She cannot take anti-inflammatories because of ongoing cardiac issues.  She had been doing physical therapy up into the last week and a half as it was not very much helpful.  She can take Tylenol  but that is about all.  She says the pain is enough that she cannot sleep on that side at night.  It hurts with ambulation and is fairly constant.  Denies any fever chills  Review of Systems  All other systems reviewed and are negative.    Objective: Vital Signs: There were no vitals taken for this visit.  Physical Exam Constitutional:      Appearance: Normal appearance.  Pulmonary:     Effort: Pulmonary effort is normal.     Breath sounds: Normal breath sounds.  Skin:    General: Skin is warm and dry.  Neurological:     General: No focal deficit present.     Mental Status: She is alert and oriented to person, place, and time.  Psychiatric:        Mood and Affect: Mood normal.  Behavior: Behavior normal.     Ortho Exam Examination of her right hip she has fluid range of motion no pain with internal/external rotation no pain reproducible in the groin.  With external rotation does get some lateral hip pain.  She has 5 out of 5 strength with resisted dorsiflexion plantarflexion of her ankles extension flexion of her knees lection of her hips.  She is focally tender to deep palpation over the trochanteric bursa Specialty Comments:  MRI LUMBAR SPINE WITHOUT CONTRAST   TECHNIQUE: Multiplanar, multisequence MR imaging of the lumbar spine was performed. No intravenous contrast was administered.   COMPARISON:  None.   FINDINGS: Segmentation:  Standard.   Alignment:  Physiologic.   Vertebrae:  No fracture, evidence of discitis, or bone lesion.   Conus medullaris and cauda equina: Conus extends to the L2 level. Conus  and cauda equina appear normal.   Paraspinal and other soft tissues: Negative.   Disc levels:   At L5-S1, there is a small central disc protrusion in close proximity to the left S1 nerve root in the lateral recess. There is no central spinal canal or neural foraminal stenosis.   IMPRESSION: 1. Small central disc protrusion at L5-S1 in close proximity to the left S1 nerve root in the lateral recess. Correlate for left S1 radiculopathy. 2. Otherwise normal lumbar spine.     Electronically Signed By: Franky Stanford M.D. On: 06/07/2021 23:48 ---- MRI PELVIS WITHOUT CONTRAST   TECHNIQUE: Multiplanar multisequence MR imaging of the pelvis was performed. No intravenous contrast was administered.   COMPARISON:  None.   FINDINGS: Bones:   No hip fracture, dislocation or avascular necrosis.   No periosteal reaction or bone destruction. No aggressive osseous lesion.   Normal sacrum and sacroiliac joints. No SI joint widening or erosive changes.   Articular cartilage and labrum   Articular cartilage: Articular cartilage thinning without evidence of full-thickness defect.   Labrum: Grossly intact, but evaluation is limited by lack of intraarticular fluid.   Joint or bursal effusion   Joint effusion:  No hip joint effusion.  No SI joint effusion.   Bursae:  No bursa formation.   Muscles and tendons   Flexors: Normal.   Extensors: Normal.   Abductors: Normal.   Adductors: Normal.   Gluteals: Normal.   Hamstrings: Normal.   Other findings   No pelvic free fluid. No fluid collection or hematoma. No inguinal lymphadenopathy. No inguinal hernia. There is a fibroid uterus.   IMPRESSION: 1.  No evidence of sacroiliitis.   2.  No significant hip arthropathy or joint effusion.   3.  No evidence of tendinopathy or muscle strain.   4.  Fibroid uterus.     Electronically Signed   By: Imran  Ahmed D.O.   On: 06/07/2021 21:54  Imaging: XR HIP UNILAT W OR W/O  PELVIS 2-3 VIEWS RIGHT Result Date: 03/17/2024 Radiographs of her right hip do show some early degenerative changes but no significant changes from previous x-rays 2 years ago no evidence of fracture    PMFS History: Patient Active Problem List   Diagnosis Date Noted   GAD (generalized anxiety disorder) 09/14/2023   Major depressive disorder, recurrent episode, moderate (HCC) 09/14/2023   PTSD (post-traumatic stress disorder) 09/14/2023   Chronic right hip pain 05/20/2021   Hyperlipidemia 11/22/2020   Tobacco use 11/22/2020   Depression 11/22/2020   Multiple sclerosis 11/22/2020   CAD (coronary artery disease) 07/05/2019   Non-ST elevation (NSTEMI) myocardial infarction (HCC) 07/05/2019  Unstable angina (HCC) 07/03/2019   Ventricular tachycardia (HCC)    Past Medical History:  Diagnosis Date   Hyperlipidemia    Hypertension    MS (multiple sclerosis)    NSTEMI (non-ST elevated myocardial infarction) (HCC)    NSVT (nonsustained ventricular tachycardia) (HCC) 07/02/2019   S/P angioplasty with stent    DES to LAD and Lcx     Family History  Problem Relation Age of Onset   Sudden Cardiac Death Maternal Grandfather     Past Surgical History:  Procedure Laterality Date   ABDOMINAL AORTOGRAM W/LOWER EXTREMITY N/A 07/10/2021   Procedure: ABDOMINAL AORTOGRAM W/LOWER EXTREMITY;  Surgeon: Gretta Lonni PARAS, MD;  Location: [facility];  Service: Cardiovascular;  Laterality: N/A;   ABDOMINAL AORTOGRAM W/LOWER EXTREMITY Bilateral 04/09/2022   Procedure: ABDOMINAL AORTOGRAM W/LOWER EXTREMITY;  Surgeon: Gretta Lonni PARAS, MD;  Location: [facility];  Service: Cardiovascular;  Laterality: Bilateral;   ABDOMINAL AORTOGRAM W/LOWER EXTREMITY N/A 01/06/2024   Procedure: ABDOMINAL AORTOGRAM W/LOWER EXTREMITY;  Surgeon: Gretta Lonni PARAS, MD;  Location: [facility];  Service: Cardiovascular;  Laterality: N/A;   CHOLECYSTECTOMY     CORONARY BALLOON ANGIOPLASTY N/A  07/03/2019   Procedure: CORONARY BALLOON ANGIOPLASTY;  Surgeon: Mady Lonni, MD;  Location: [facility];  Service: Cardiovascular;  Laterality: N/A;  distal lad   CORONARY STENT INTERVENTION N/A 07/03/2019   Procedure: CORONARY STENT INTERVENTION;  Surgeon: Mady Lonni, MD;  Location: [facility];  Service: Cardiovascular;  Laterality: N/A;  cfx lad    CORONARY ULTRASOUND/IVUS N/A 07/03/2019   Procedure: Intravascular Ultrasound/IVUS;  Surgeon: Mady Lonni, MD;  Location: [facility];  Service: Cardiovascular;  Laterality: N/A;   LEFT HEART CATH AND CORONARY ANGIOGRAPHY N/A 07/03/2019   Procedure: LEFT HEART CATH AND CORONARY ANGIOGRAPHY;  Surgeon: Mady Lonni, MD;  Location: [facility];  Service: Cardiovascular;  Laterality: N/A;   LOWER EXTREMITY ANGIOGRAPHY N/A 01/06/2024   Procedure: Lower Extremity Angiography;  Surgeon: Gretta Lonni PARAS, MD;  Location: [facility];  Service: Cardiovascular;  Laterality: N/A;   LOWER EXTREMITY INTERVENTION Bilateral 01/06/2024   Procedure: LOWER EXTREMITY INTERVENTION;  Surgeon: Gretta Lonni PARAS, MD;  Location: [facility];  Service: Cardiovascular;  Laterality: Bilateral;   PERIPHERAL VASCULAR BALLOON ANGIOPLASTY Bilateral 04/09/2022   Procedure: PERIPHERAL VASCULAR BALLOON ANGIOPLASTY;  Surgeon: Gretta Lonni PARAS, MD;  Location: [facility];  Service: Cardiovascular;  Laterality: Bilateral;  left and right common iliac   PERIPHERAL VASCULAR INTERVENTION  07/10/2021   Procedure: PERIPHERAL VASCULAR INTERVENTION;  Surgeon: Gretta Lonni PARAS, MD;  Location: [facility];  Service: Cardiovascular;;  Bilateral Common Iliac Stents   PERIPHERAL VASCULAR INTERVENTION Right 04/09/2022   Procedure: PERIPHERAL VASCULAR INTERVENTION;  Surgeon: Gretta Lonni PARAS, MD;  Location: [facility];  Service: Cardiovascular;  Laterality: Right;  right common iliac   TUBAL LIGATION      Social History   Occupational History   Not on file  Tobacco Use   Smoking status: Every Day    Current packs/day: 0.50    Types: Cigarettes    Passive exposure: Current   Smokeless tobacco: Never  Vaping Use   Vaping status: Some Days  Substance and Sexual Activity   Alcohol use: Not Currently   Drug use: Yes    Types: Marijuana    Comment: everyday: Delta A they sell at the store,   Sexual activity: Not Currently        "

## 2024-07-07 ENCOUNTER — Ambulatory Visit: Payer: Self-pay | Admitting: Internal Medicine

## 2024-08-22 ENCOUNTER — Ambulatory Visit

## 2024-08-22 ENCOUNTER — Ambulatory Visit (HOSPITAL_COMMUNITY)
